# Patient Record
Sex: Female | Born: 1945 | Race: White | Hispanic: No | Marital: Married | Smoking: Former smoker
Health system: Southern US, Community
[De-identification: ages and names within clinical notes are randomized; demographics above are authoritative.]

## PROBLEM LIST (undated history)

## (undated) DIAGNOSIS — C4490 Unspecified malignant neoplasm of skin, unspecified: Secondary | ICD-10-CM

## (undated) DIAGNOSIS — T8859XA Other complications of anesthesia, initial encounter: Secondary | ICD-10-CM

## (undated) DIAGNOSIS — M069 Rheumatoid arthritis, unspecified: Secondary | ICD-10-CM

## (undated) DIAGNOSIS — I219 Acute myocardial infarction, unspecified: Secondary | ICD-10-CM

## (undated) DIAGNOSIS — D649 Anemia, unspecified: Secondary | ICD-10-CM

## (undated) DIAGNOSIS — M549 Dorsalgia, unspecified: Secondary | ICD-10-CM

## (undated) DIAGNOSIS — I2699 Other pulmonary embolism without acute cor pulmonale: Secondary | ICD-10-CM

## (undated) DIAGNOSIS — J189 Pneumonia, unspecified organism: Secondary | ICD-10-CM

## (undated) DIAGNOSIS — M419 Scoliosis, unspecified: Secondary | ICD-10-CM

## (undated) DIAGNOSIS — G8929 Other chronic pain: Secondary | ICD-10-CM

## (undated) DIAGNOSIS — K219 Gastro-esophageal reflux disease without esophagitis: Secondary | ICD-10-CM

## (undated) HISTORY — PX: TUBAL LIGATION: SHX77

## (undated) HISTORY — DX: Anemia, unspecified: D64.9

## (undated) HISTORY — PX: COLONOSCOPY W/ POLYPECTOMY: SHX1380

## (undated) HISTORY — PX: COLONOSCOPY: SHX174

## (undated) HISTORY — PX: RECTOCELE REPAIR: SHX761

## (undated) HISTORY — DX: Rheumatoid arthritis, unspecified: M06.9

## (undated) HISTORY — DX: Unspecified malignant neoplasm of skin, unspecified: C44.90

## (undated) HISTORY — DX: Gastro-esophageal reflux disease without esophagitis: K21.9

## (undated) HISTORY — DX: Scoliosis, unspecified: M41.9

## (undated) HISTORY — PX: TONSILLECTOMY: SUR1361

## (undated) HISTORY — PX: HERNIA REPAIR: SHX51

## (undated) HISTORY — DX: Other chronic pain: G89.29

## (undated) HISTORY — DX: Dorsalgia, unspecified: M54.9

## (undated) HISTORY — DX: Other pulmonary embolism without acute cor pulmonale: I26.99

## (undated) HISTORY — DX: Acute myocardial infarction, unspecified: I21.9

## (undated) HISTORY — PX: BUNIONECTOMY: SHX129

## (undated) HISTORY — PX: OTHER SURGICAL HISTORY: SHX169

## (undated) MED FILL — Iron Sucrose Inj 20 MG/ML (Fe Equiv): INTRAVENOUS | Qty: 10 | Status: AC

---

## 1998-04-28 ENCOUNTER — Ambulatory Visit (HOSPITAL_COMMUNITY): Admission: RE | Admit: 1998-04-28 | Discharge: 1998-04-28 | Payer: Self-pay | Admitting: Obstetrics and Gynecology

## 1998-10-04 ENCOUNTER — Ambulatory Visit (HOSPITAL_COMMUNITY): Admission: RE | Admit: 1998-10-04 | Discharge: 1998-10-04 | Payer: Self-pay | Admitting: Gastroenterology

## 2000-01-05 ENCOUNTER — Encounter: Payer: Self-pay | Admitting: Gynecology

## 2000-01-05 ENCOUNTER — Ambulatory Visit (HOSPITAL_COMMUNITY): Admission: RE | Admit: 2000-01-05 | Discharge: 2000-01-05 | Payer: Self-pay | Admitting: Gynecology

## 2001-06-14 ENCOUNTER — Ambulatory Visit (HOSPITAL_COMMUNITY): Admission: RE | Admit: 2001-06-14 | Discharge: 2001-06-14 | Payer: Self-pay | Admitting: Gynecology

## 2001-06-14 ENCOUNTER — Encounter: Payer: Self-pay | Admitting: Gynecology

## 2003-03-18 ENCOUNTER — Ambulatory Visit (HOSPITAL_COMMUNITY): Admission: RE | Admit: 2003-03-18 | Discharge: 2003-03-18 | Payer: Self-pay | Admitting: Gastroenterology

## 2003-05-18 ENCOUNTER — Emergency Department (HOSPITAL_COMMUNITY): Admission: EM | Admit: 2003-05-18 | Discharge: 2003-05-18 | Payer: Self-pay | Admitting: Emergency Medicine

## 2004-03-31 ENCOUNTER — Other Ambulatory Visit: Admission: RE | Admit: 2004-03-31 | Discharge: 2004-03-31 | Payer: Self-pay | Admitting: Gynecology

## 2004-04-07 ENCOUNTER — Ambulatory Visit (HOSPITAL_COMMUNITY): Admission: RE | Admit: 2004-04-07 | Discharge: 2004-04-07 | Payer: Self-pay | Admitting: Gynecology

## 2004-05-02 DIAGNOSIS — Z86711 Personal history of pulmonary embolism: Secondary | ICD-10-CM | POA: Insufficient documentation

## 2004-05-03 ENCOUNTER — Inpatient Hospital Stay (HOSPITAL_COMMUNITY): Admission: EM | Admit: 2004-05-03 | Discharge: 2004-05-04 | Payer: Self-pay | Admitting: Podiatry

## 2004-12-29 ENCOUNTER — Encounter: Admission: RE | Admit: 2004-12-29 | Discharge: 2004-12-29 | Payer: Self-pay

## 2006-03-19 ENCOUNTER — Other Ambulatory Visit: Admission: RE | Admit: 2006-03-19 | Discharge: 2006-03-19 | Payer: Self-pay | Admitting: Gynecology

## 2006-03-20 ENCOUNTER — Ambulatory Visit (HOSPITAL_COMMUNITY): Admission: RE | Admit: 2006-03-20 | Discharge: 2006-03-20 | Payer: Self-pay | Admitting: Gynecology

## 2006-08-27 ENCOUNTER — Emergency Department (HOSPITAL_COMMUNITY): Admission: EM | Admit: 2006-08-27 | Discharge: 2006-08-27 | Payer: Self-pay | Admitting: Emergency Medicine

## 2009-05-16 ENCOUNTER — Ambulatory Visit: Payer: Self-pay | Admitting: Cardiology

## 2009-05-16 ENCOUNTER — Inpatient Hospital Stay (HOSPITAL_COMMUNITY): Admission: EM | Admit: 2009-05-16 | Discharge: 2009-05-17 | Payer: Self-pay | Admitting: Emergency Medicine

## 2009-05-17 ENCOUNTER — Encounter: Payer: Self-pay | Admitting: Cardiology

## 2009-05-19 ENCOUNTER — Encounter: Payer: Self-pay | Admitting: Cardiology

## 2009-05-21 ENCOUNTER — Ambulatory Visit: Payer: Self-pay | Admitting: Cardiology

## 2009-05-21 ENCOUNTER — Ambulatory Visit (HOSPITAL_COMMUNITY): Admission: RE | Admit: 2009-05-21 | Discharge: 2009-05-21 | Payer: Self-pay | Admitting: Cardiology

## 2009-05-21 ENCOUNTER — Encounter: Payer: Self-pay | Admitting: Cardiology

## 2009-05-21 ENCOUNTER — Ambulatory Visit: Payer: Self-pay

## 2009-06-01 DIAGNOSIS — M549 Dorsalgia, unspecified: Secondary | ICD-10-CM | POA: Insufficient documentation

## 2009-06-01 DIAGNOSIS — Z8639 Personal history of other endocrine, nutritional and metabolic disease: Secondary | ICD-10-CM | POA: Insufficient documentation

## 2009-06-01 DIAGNOSIS — Z862 Personal history of diseases of the blood and blood-forming organs and certain disorders involving the immune mechanism: Secondary | ICD-10-CM | POA: Insufficient documentation

## 2009-06-01 DIAGNOSIS — M4120 Other idiopathic scoliosis, site unspecified: Secondary | ICD-10-CM | POA: Insufficient documentation

## 2009-06-02 ENCOUNTER — Ambulatory Visit: Payer: Self-pay | Admitting: Cardiology

## 2009-06-02 DIAGNOSIS — I214 Non-ST elevation (NSTEMI) myocardial infarction: Secondary | ICD-10-CM | POA: Insufficient documentation

## 2009-06-02 DIAGNOSIS — I252 Old myocardial infarction: Secondary | ICD-10-CM | POA: Insufficient documentation

## 2009-06-25 ENCOUNTER — Telehealth: Payer: Self-pay | Admitting: Cardiology

## 2009-07-07 ENCOUNTER — Encounter: Admission: RE | Admit: 2009-07-07 | Discharge: 2009-07-07 | Payer: Self-pay | Admitting: Internal Medicine

## 2009-07-07 ENCOUNTER — Ambulatory Visit (HOSPITAL_COMMUNITY): Admission: RE | Admit: 2009-07-07 | Discharge: 2009-07-07 | Payer: Self-pay | Admitting: Internal Medicine

## 2009-08-30 ENCOUNTER — Telehealth: Payer: Self-pay | Admitting: Cardiology

## 2010-07-21 NOTE — Progress Notes (Signed)
Summary: plavix  Phone Note Call from Patient Call back at [phone]   Caller: Daughter/Michelle Reason for Call: Talk to Nurse Summary of Call: plavix is to expensive and request something else be called in  Initial call taken by: Migdalia Dk,  June 25, 2009 11:56 AM  Follow-up for Phone Call        DAUGHTER AWARE NO GEN FOR PLAVIX. WILL LET MOTHER KNOW AND WILL CALL BACK IF HAS ANY FURTHER QUESTIONS Follow-up by: Scherrie Bateman, LPN,  June 25, 2009 12:28 PM

## 2010-07-21 NOTE — Letter (Signed)
Summary: [employer] Insurance Authorization  [facility] Insurance Authorization   Imported By: Roderic Ovens 07/19/2009 16:12:57  _____________________________________________________________________  External Attachment:    Type:   Image     Comment:   External Document

## 2010-07-21 NOTE — Progress Notes (Signed)
Summary: pt cant afford plavix  Phone Note Call from Patient Call back at [phone]   Caller: Daughter / Marcelino Duster Reason for Call: Talk to Nurse, Talk to Doctor Summary of Call: Per daughter Patient's insurance will not pay for Plavix anymore is there something else she can take cause pt cant afford it Initial call taken by: Omer Jack,  August 30, 2009 4:42 PM  Follow-up for Phone Call        [facility] Scherrie Bateman, LPN  August 30, 2009 5:04 PM WAS ABLE TO SPEAK WITH DAUGHTER RE PLAVIX INFORMED IT WOULD BE BEST IF MOM COULD STAY ON PLAVIX FOR 1 YEAR ACCORDING TO GUIDELINES  IS AT INCREASE FOR ANOTHER MI IF ONLY ON ASA.DAUGHTER WILL LET PT KNOW AND HAVE PT CALL  OFFICE IF ABLE TO CONTINUE. Follow-up by: Scherrie Bateman, LPN,  August 31, 2009 1:37 PM

## 2010-09-21 LAB — CBC
Hemoglobin: 13.7 g/dL (ref 12.0–15.0)
Hemoglobin: 13.9 g/dL (ref 12.0–15.0)
MCHC: 33.6 g/dL (ref 30.0–36.0)
MCHC: 33.7 g/dL (ref 30.0–36.0)
Platelets: 186 10*3/uL (ref 150–400)
Platelets: 212 10*3/uL (ref 150–400)
Platelets: 213 10*3/uL (ref 150–400)
RDW: 14.1 % (ref 11.5–15.5)
RDW: 14.2 % (ref 11.5–15.5)
RDW: 14.7 % (ref 11.5–15.5)
WBC: 6.5 10*3/uL (ref 4.0–10.5)

## 2010-09-21 LAB — COMPREHENSIVE METABOLIC PANEL
AST: 42 U/L — ABNORMAL HIGH (ref 0–37)
Albumin: 3.8 g/dL (ref 3.5–5.2)
BUN: 14 mg/dL (ref 6–23)
CO2: 28 mEq/L (ref 19–32)
Calcium: 9.4 mg/dL (ref 8.4–10.5)
Creatinine, Ser: 0.96 mg/dL (ref 0.4–1.2)
GFR calc Af Amer: >60 mL/min (ref >60)
GFR calc non Af Amer: 59 mL/min — ABNORMAL LOW (ref >60)

## 2010-09-21 LAB — POCT CARDIAC MARKERS
CKMB, poc: 3.1 ng/mL (ref 1.0–8.0)
Myoglobin, poc: 69.6 ng/mL (ref 12–200)
Troponin i, poc: 0.25 ng/mL (ref 0.00–0.09)

## 2010-09-21 LAB — CARDIAC PANEL(CRET KIN+CKTOT+MB+TROPI)
CK, MB: 5.5 ng/mL — ABNORMAL HIGH (ref 0.3–4.0)
Relative Index: INVALID (ref 0.0–2.5)
Total CK: 58 U/L (ref 7–177)
Total CK: 78 U/L (ref 7–177)
Troponin I: 0.54 ng/mL (ref 0.00–0.06)

## 2010-09-21 LAB — CK TOTAL AND CKMB (NOT AT ARMC)
CK, MB: 1.9 ng/mL (ref 0.3–4.0)
CK, MB: 6.8 ng/mL — ABNORMAL HIGH (ref 0.3–4.0)
Relative Index: INVALID (ref 0.0–2.5)

## 2010-09-21 LAB — LIPID PANEL
Cholesterol: 151 mg/dL (ref 0–200)
HDL: 58 mg/dL (ref >39)
LDL Cholesterol: 78 mg/dL (ref 0–99)
Total CHOL/HDL Ratio: 2.6 RATIO
Triglycerides: 76 mg/dL (ref <150)
VLDL: 15 mg/dL (ref 0–40)

## 2010-09-21 LAB — DIFFERENTIAL
Basophils Absolute: 0 10*3/uL (ref 0.0–0.1)
Eosinophils Relative: 4 % (ref 0–5)
Lymphocytes Relative: 18 % (ref 12–46)
Lymphs Abs: 1.2 10*3/uL (ref 0.7–4.0)
Neutro Abs: 4.5 10*3/uL (ref 1.7–7.7)

## 2010-09-21 LAB — TSH: TSH: 0.822 u[IU]/mL (ref 0.350–4.500)

## 2010-09-21 LAB — TROPONIN I
Troponin I: 0.04 ng/mL (ref 0.00–0.06)
Troponin I: 0.68 ng/mL (ref 0.00–0.06)

## 2010-09-21 LAB — HEPARIN LEVEL (UNFRACTIONATED): Heparin Unfractionated: 0.78 IU/mL — ABNORMAL HIGH (ref 0.30–0.70)

## 2010-09-21 LAB — MAGNESIUM: Magnesium: 2.2 mg/dL (ref 1.5–2.5)

## 2010-09-21 LAB — APTT: aPTT: 25 seconds (ref 24–37)

## 2010-11-04 NOTE — Discharge Summary (Signed)
Janice [name], Janice [name]             ACCOUNT NO.:  [id number]   MEDICAL RECORD NO.:  [id number]          PATIENT TYPE:  INP   LOCATION:  2001                         FACILITY:  [facility]   PHYSICIAN:  Lonia Blood, M.D.      DATE OF BIRTH:  [birth year]   DATE OF ADMISSION:  05/03/2004  DATE OF DISCHARGE:  05/04/2004                                 DISCHARGE SUMMARY   DISCHARGE DIAGNOSES:  1.  Bilateral pulmonary emboli.  2.  Left lower lobe nodule.  3.  Rheumatoid arthritis.   DISCHARGE MEDICATIONS:  1.  Lovenox by subcutaneous injection 100 mg twice a day.  Lovenox to be      given by her daughter __________.  2.  Coumadin 7.5 mg daily.  3.  Patient to resume home medications which include:      1.  Nexium 40 mg daily.      2.  Kay Ciel 20 mEq daily.      3.  Methotrexate 2.5 mg 10 tablets weekly on Friday.      4.  Celebrex 200 mg b.i.d.      5.  Prednisone 5 mg daily.   PRIMARY CARE PHYSICIAN:  Dr. Areatha Keas with [facility].   DISPOSITION:  The patient is being discharged home with some home health.  Home health nurse will come out to the patient's house to check her PT/INR  every day for the first 3 days and then subsequently twice a week.  She will  be followed by her primary care physician, who is Dr. Phylliss Bob, in his office to  check her INR as necessary until she is therapeutic.  Between now and when  her INR is going to be therapeutic, the patient's daughter will continue to  administer Lovenox 100 mg twice a day to the patient subcutaneously at home.  The patient's daughter was trained in the hospital and was observed to give  the patient Lovenox successfully.   CONSULTATIONS:  None.   PROCEDURES PERFORMED:  CT of chest with contrast performed on May 12, 2004 that showed right lower lobe emboli and probable left lower lobe emboli  as well, 8 x 6-mm left lower lobe nodule, left lower lobe bronchiectasis or  scarring, ground-glass airspace opacity in  the posterior left upper lobe  with questionable inflammatory or circular pulmonary embolus, but followup  recommended, shortened mediastinal and bilateral hilar lymph nodes which may  be reactive.   BRIEF HISTORY AND PHYSICAL:  Janice [name] is a 65 year old lady with history  of rheumatoid arthritis and GERD, among other things.  She was admitted on  the 15th with generalized weakness and chest tightness.  The patient was  evaluated in the emergency room and was found to have the bilateral  embolisms as described above.  She was subsequently admitted for  anticoagulation and further treatment.   HOSPITAL COURSE:  PROBLEM #1 - BILATERAL PULMONARY EMBOLI:  The patient was  initially started on IV heparin.  Subsequently, that was switched to Lovenox  and Coumadin.  The patient's daughter as well as patient were both educated  and given more tapes on use of anticoagulation.  They were given options of  continued anticoagulation in the hospital with Lovenox or heparin until her  INR was therapeutic with a goal between 2 and 3; the other option is to  continue to use Lovenox at home and check the INR.  The patient opted for  going home with Lovenox injection at home.  We subsequently discharged the  patient after training her and her daughter on how to do this.  She will be  followed up by her primary care physician and we will give a call to Dr.  Phylliss Bob so he knows what is happening.   PROBLEM #2 - LEFT LOWER LOBE NODULE:  The patient has a left lower lobe  nodule which is small per her CT.  We reported this to her primary care  physician so he could have a followup with the patient probably 6 months  from now to see if that is stable.   PROBLEM #3 - RHEUMATOID ARTHRITIS:  This seems to be stable.  The patient is  already on methotrexate and prednisone, and she is to continue on her home  medicine.   PROBLEM #4 - GASTROESOPHAGEAL REFLUX DISEASE:  She has been taking Nexium at  home for  her GERD and over here we also continued her on PPIs, and she is  going back home on the same medicine.   OTHER TESTS:  Other tests ordered were lower extremity Dopplers performed on  May 03, 2004 that showed no apparent DVT seen in the lower extremities.       LG/MEDQ  D:  05/04/2004  T:  05/05/2004  Job:  [id number]   cc:   Areatha Keas, M.D.  [address]  [address]  [place]  [place] [address]  Fax: [phone]

## 2010-11-04 NOTE — Op Note (Signed)
NAMEMAGHEN, GROUP                       ACCOUNT NO.:  [id number]   MEDICAL RECORD NO.:  [id number]                   PATIENT TYPE:  AMB   LOCATION:  ENDO                                 FACILITY:  [facility]   PHYSICIAN:  Petra Kuba, M.D.                 DATE OF BIRTH:  [birth year]   DATE OF PROCEDURE:  DATE OF DISCHARGE:  03/18/2003                                 OPERATIVE REPORT   PROCEDURE:  Colonoscopy.   INDICATIONS FOR PROCEDURE:  A patient with a history of colon polyps due for  repeat screening. Consent was signed after risks, benefits, methods, and  options were thoroughly discussed multiple times in the past.   MEDICINES USED:  Demerol 50, Versed 7.   DESCRIPTION OF PROCEDURE:  Rectal inspection was pertinent for external  hemorrhoids, small. Digital exam was negative. The pediatric video  adjustable colonoscope was inserted and fairly easily advanced around the  colon to the cecum. This did require some abdominal pressure but no position  changes. The scope was inserted a short ways into the terminal ileum which  was normal. Photo documentation was obtained. The scope was slowly  withdrawn. In the cecum and on the right side of the colon was a pool of red  fluid. It looked more like red jello or juice but we went ahead and  suctioned some it just to confirm it and it was guaiac negative. The scope  was further withdrawn. There was left greater than right diverticula with  only a few scattered on the right side of the colon but other than that no  other abnormalities were seen as we slowly withdrew back to the rectum. The  prep was fairly adequate and did require some washing and suctioning for  adequate visualization. No polypoid lesions were seen. Anal rectal pull  through and retroflexion confirmed some small hemorrhoids. The scope was  reinserted a short ways up the left side of the colon, air was suctioned,  scope removed. The patient tolerated the procedure  well. There was no  obvious or immediate complication.   ENDOSCOPIC DIAGNOSIS:  1. Internal and external hemorrhoids.  2. Left greater than right diverticula.  3. Otherwise within normal limits to the terminal ileum as in his side some     red liquid was suctioned and guaiac negative from the right side of the     colon.    PLAN:  Happy to see back p.r.n.  Return care to Dr. Arlyce Dice and Phylliss Bob for the  customary health care maintenance to include yearly rectals and guaiacs and  repeat colon screening in five years.                                               Petra Kuba, M.D.  MEM/MEDQ  D:  03/18/2003  T:  03/18/2003  Job:  [id number]   cc:   Teena Irani. Arlyce Dice, M.D.  [address]  [place]  [place] [address]  Fax: [phone]   Areatha Keas, M.D.  [address]  [address]  [place]  [place] [address]  Fax: [phone]

## 2010-11-04 NOTE — H&P (Signed)
Janice [name], Janice [name] NO.:  [id number]   MEDICAL RECORD NO.:  [id number]          PATIENT TYPE:  INP   LOCATION:  1828                         FACILITY:  [facility]   PHYSICIAN:  Michaelyn Barter, M.D. DATE OF BIRTH:  Mar 26, 1946   DATE OF ADMISSION:  05/02/2004  DATE OF DISCHARGE:                                HISTORY & PHYSICAL   CHIEF COMPLAINT:  Chest tightness.   HISTORY OF PRESENT ILLNESS:  The patient states that she broke her right  ankle several months ago. Said that she was incapacitated for approximately  4 months secondary to this ankle fracture. She initially had a cast placed  over the fracture. The cast was subsequently replaced by a boot. Said that  the boot was removed last week. However, she noticed that both of her ankles  had began to swell. She said that the right ankle swelling was greater than  the left. She also noticed that the swelling traveled to her right knee. She  went on to state that approximately a week and a half ago, she began to  develop chest tightness, which comes and goes. She went to see her primary  care physician, Dr. Phylliss Bob today for evaluation of the bilateral ankle  swelling. However, while in his office, she had an EKG performed, which  revealed some abnormalities and also had a pulse oximetry performed, which  showed her oxygen saturation to be low. Dr. Phylliss Bob became concerned about the  possibility of a pulmonary embolism and referred her to the emergency room  for further evaluation. Currently, she states that she is having some mild  chest tightness, described as approximately a 3 out of 10 but she denies  having any current shortness of breath.   PAST MEDICAL HISTORY:  1.  Right ankle fracture.  2.  Rheumatoid arthritis.   PAST SURGICAL HISTORY:  1.  Hernia removal.  2.  Tonsillectomy.  3.  Bladder surgery.  4.  Bunionectomy.   ALLERGIES:  No known drug allergies.   FAMILY HISTORY:  Mother deceased from  esophageal cancer. Father, fluid in  the lungs.   ADMISSION MEDICATIONS:  1.  Methotrexate.  2.  Celebrex.  3.  Nexium.  4.  Furosemide.  5.  Potassium.  The patient does not know the dosages of any of her current medications but  states that her daughter will bring those dosages with her tomorrow.   REVIEW OF SYSTEMS:  As per HPI. Otherwise, all other systems are negative.   PHYSICAL EXAMINATION:  GENERAL:  No obvious distress.  HEENT:  Anicteric. Extraocular muscles intact.  NECK:  Supple. No lymphadenopathy. Thyroid is not palpable.  CARDIAC:  S1 and S2 is present. Regular rate and rhythm.  LUNGS:  Clear bilaterally. No crackles. No wheezes.  ABDOMEN:  Soft, nontender, nondistended. Positive bowel sounds.  EXTREMITIES:  Right leg/ankle is slightly larger than the left. There is no  calf tenderness bilaterally to palpation.  MUSCULOSKELETAL:  5 out of 5 upper and lower extremity strength.  NEUROLOGIC:  The patient is alert and oriented x3.   LABORATORY DATA:  Chest CT  scan completed on May 02, 2004 interpreted  by the radiologist as right lower lobe pulmonary emboli with probable left  lower lobe pulmonary emboli. There is an 8 x 6 mm left lower lobe nodule for  which followup is recommended. Ground glass opacities within the left upper  lobe, probably inflammatory or related to pulmonary embolism, but recommend  followup to resolution. Left lower lobe bronchiectasis.   ASSESSMENT/PLAN:  1.  Bilateral pulmonary embolism. Will continue intravenous heparin, which      was initiated in the emergency room. Will get venous duplex of both      lower extremities to rule out  the possibility of deep vein thrombosis.      Will provide Darvocet p.r.n. for pain. Will continue oxygen via nasal      cannula.  2.  Rheumatoid arthritis. Currently stable. The patient's daughter will      bring dosages of home medications tomorrow.  3.  Gastrointestinal prophylaxis Protonix 40 mg q.d.   3.      Orla   OR/MEDQ  D:  05/03/2004  T:  05/03/2004  Job:  [id number]   cc:   Areatha Keas, M.D.  [address]  [address]  [place]  [place] [address]  Fax: [phone]

## 2010-11-23 IMAGING — US US ABDOMEN COMPLETE
1 series · 14 of 25 positions shown · non-contrast
Comparison: None.

CLINICAL DATA: Elevated LFTs.

ABDOMINAL ULTRASOUND COMPLETE

[Series 1: us abdomen complete · 0.33mm/px · 14 of 66 slices shown]
[im 1/66]
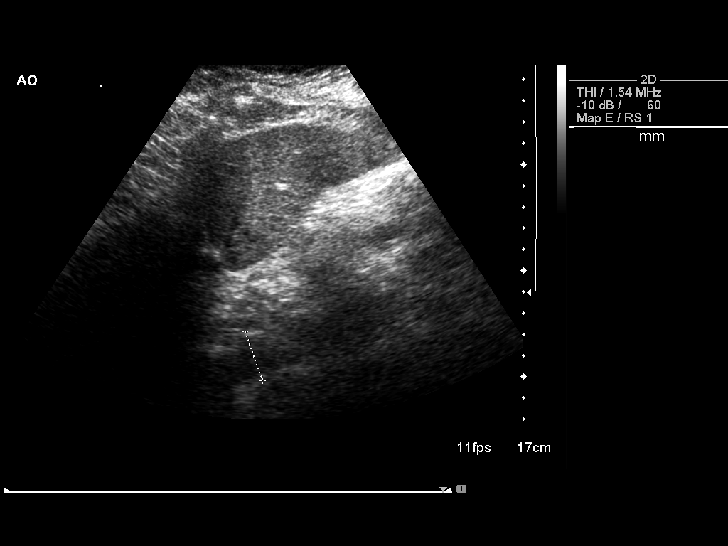
[im 6/66]
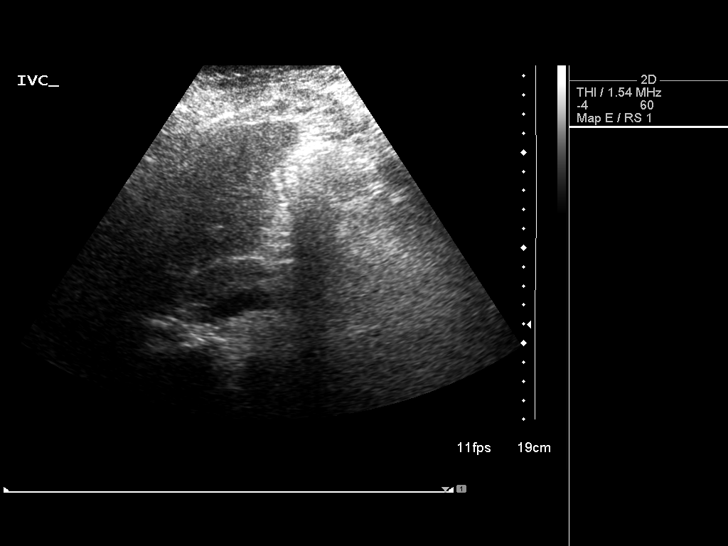
[im 11/66]
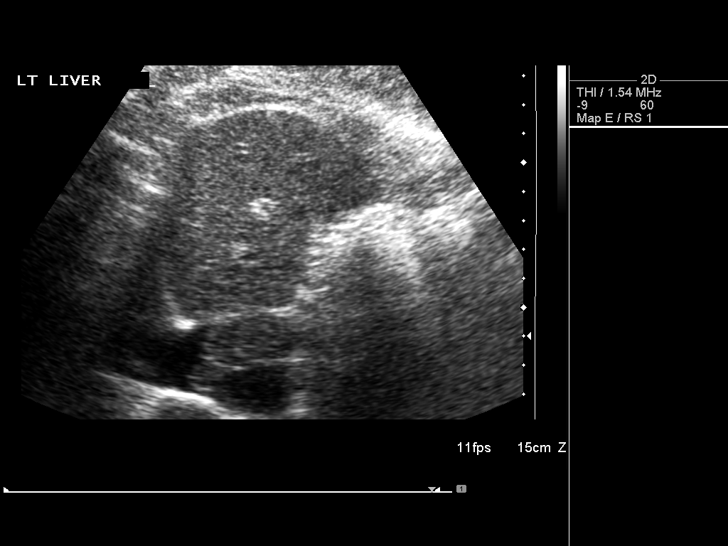
[im 17/66]
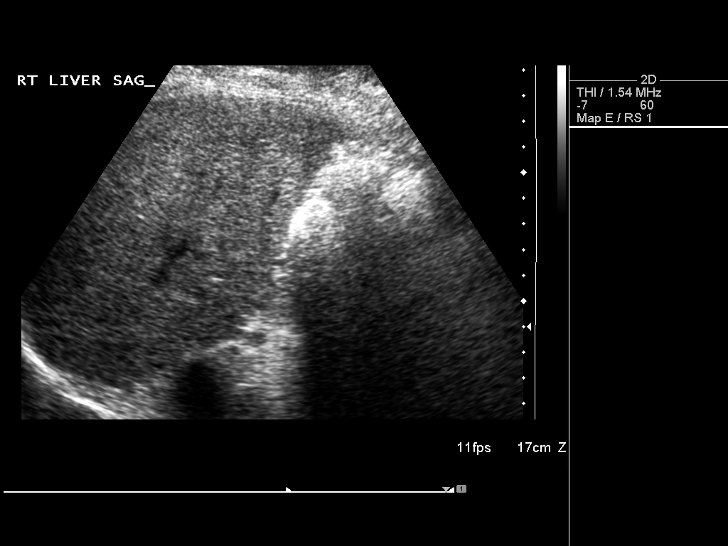
[im 22/66]
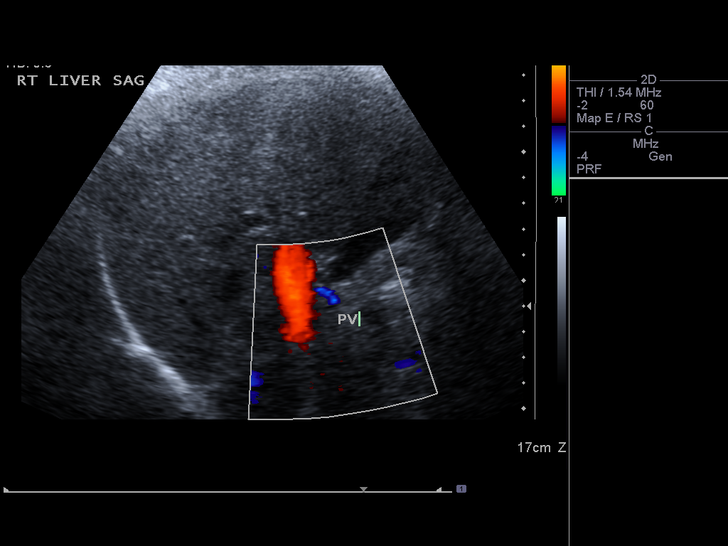
[im 25/66]
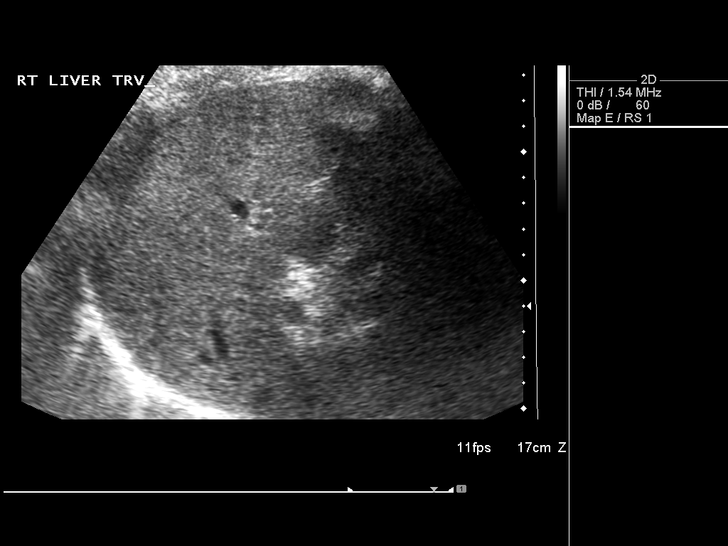
[im 30/66]
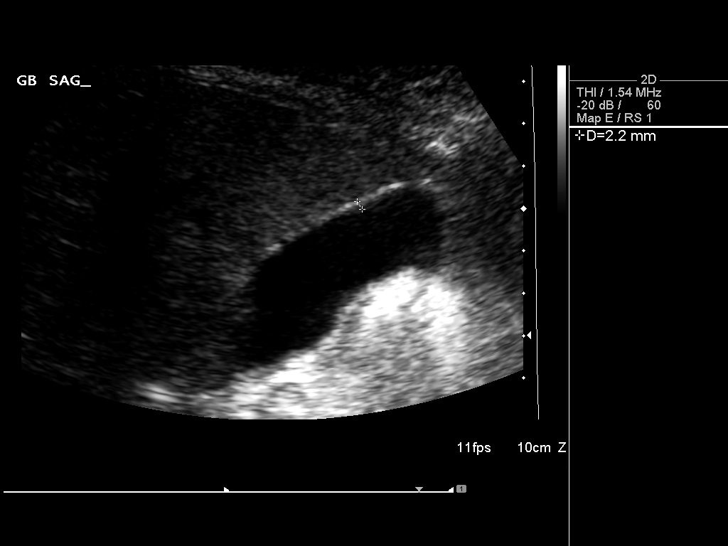
[im 36/66]
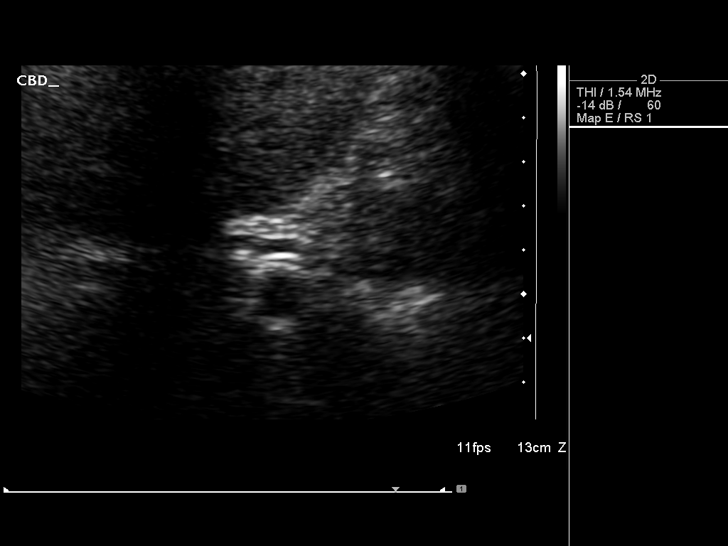
[im 41/66]
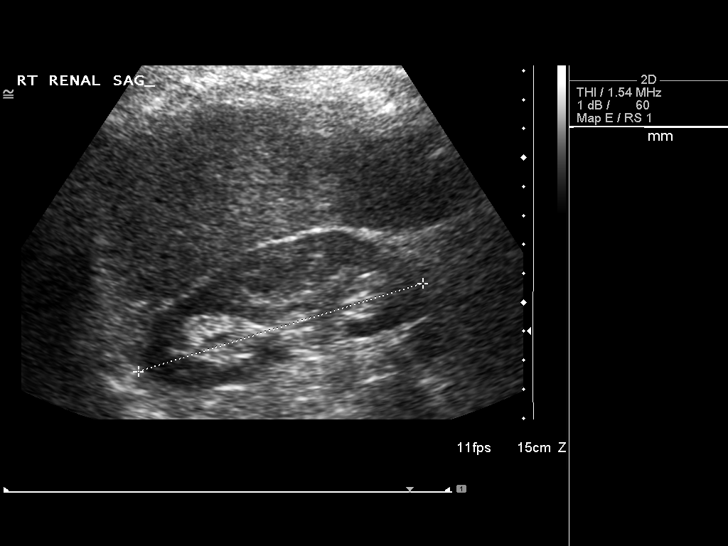
[im 44/66]
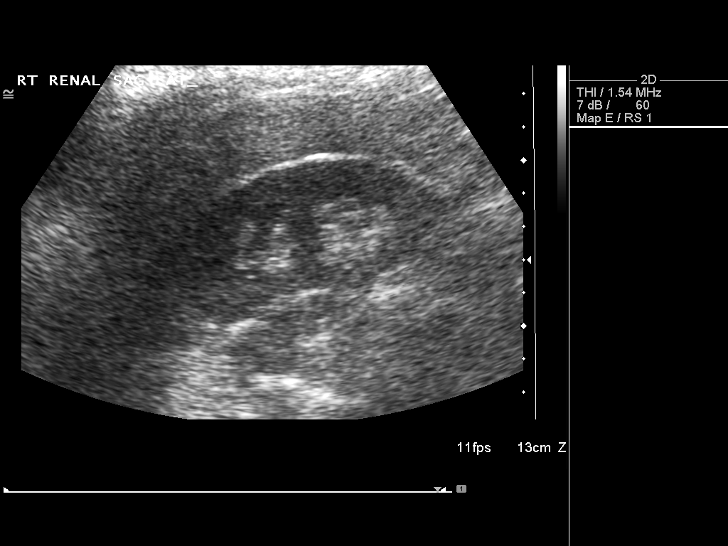
[im 49/66]
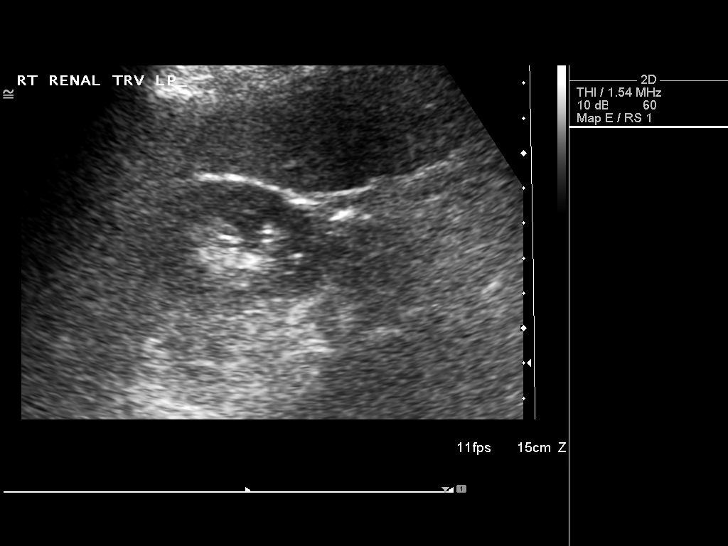
[im 55/66]
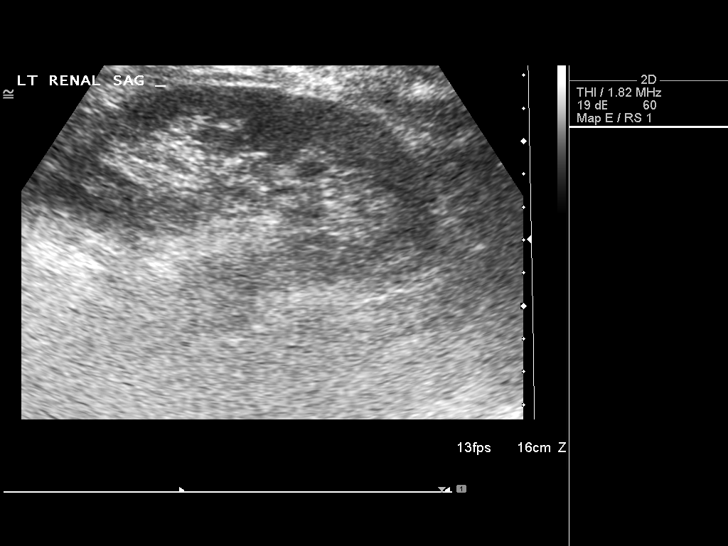
[im 60/66]
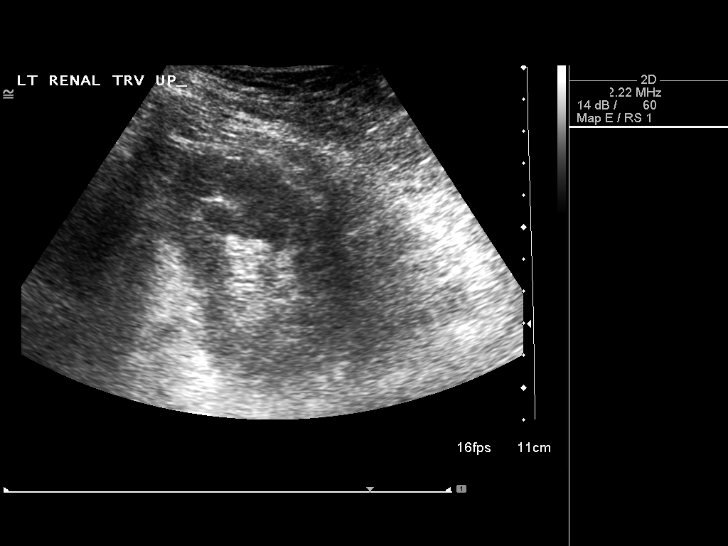
[im 66/66]
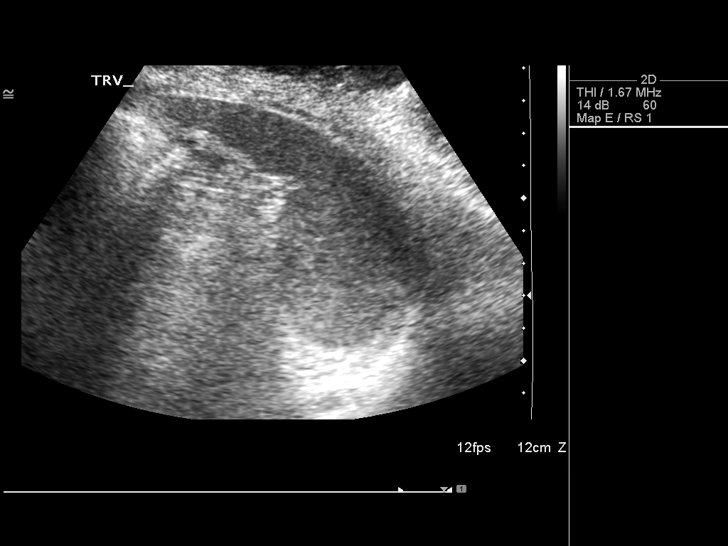

[14 of 25 positions shown; findings below may reference images not displayed]

FINDINGS: Gallbladder:  No gallstones, gallbladder wall thickening, or
pericholecystic fluid.

Common Bile Duct:  Within normal limits in caliber.  3.5 mm
diameter of the CBD.

Liver:  No focal lesion identified.  Findings consistent with fatty
infiltration of the liver.

IVC:  Appears normal.

Pancreas:  Although the pancreas is difficult to visualize in its
entirety, no focal pancreatic abnormality is identified.

Spleen:  Within normal limits in size and echotexture.  Splenic
length is 6.0 cm.

Right kidney:  Normal in size and parenchymal echogenicity.  No
evidence of mass or hydronephrosis.   10.4 cm length.

Left kidney:  Normal in size and parenchymal echogenicity.  No
hydronephrosis. 1.6 x 1.7 x 1.8 cm cyst in the lower pole.  11.3 cm
length.

Abdominal Aorta:  No aneurysm identified.  2.5 cm maximum diameter
IMPRESSION: Negative gallbladder.  No biliary duct dilatation.  Fatty
infiltration of the liver.  Left renal cyst.

## 2010-12-20 ENCOUNTER — Encounter: Payer: Self-pay | Admitting: Cardiology

## 2011-05-15 ENCOUNTER — Other Ambulatory Visit (HOSPITAL_COMMUNITY)
Admission: RE | Admit: 2011-05-15 | Discharge: 2011-05-15 | Disposition: A | Discharge status: Home / Self Care | Payer: BC Managed Care – PPO | Source: Ambulatory Visit | Attending: Internal Medicine | Admitting: Internal Medicine

## 2011-05-15 DIAGNOSIS — Z01419 Encounter for gynecological examination (general) (routine) without abnormal findings: Secondary | ICD-10-CM | POA: Insufficient documentation

## 2011-06-26 ENCOUNTER — Ambulatory Visit: Payer: Self-pay | Admitting: Cardiology

## 2012-06-25 ENCOUNTER — Ambulatory Visit: Payer: BC Managed Care – PPO | Admitting: Cardiology

## 2015-06-23 DIAGNOSIS — M8580 Other specified disorders of bone density and structure, unspecified site: Secondary | ICD-10-CM | POA: Diagnosis not present

## 2015-06-23 DIAGNOSIS — M150 Primary generalized (osteo)arthritis: Secondary | ICD-10-CM | POA: Diagnosis not present

## 2015-06-23 DIAGNOSIS — M170 Bilateral primary osteoarthritis of knee: Secondary | ICD-10-CM | POA: Diagnosis not present

## 2015-06-23 DIAGNOSIS — M0589 Other rheumatoid arthritis with rheumatoid factor of multiple sites: Secondary | ICD-10-CM | POA: Diagnosis not present

## 2015-06-23 DIAGNOSIS — M5136 Other intervertebral disc degeneration, lumbar region: Secondary | ICD-10-CM | POA: Diagnosis not present

## 2015-10-06 DIAGNOSIS — R69 Illness, unspecified: Secondary | ICD-10-CM | POA: Diagnosis not present

## 2016-01-21 DIAGNOSIS — M150 Primary generalized (osteo)arthritis: Secondary | ICD-10-CM | POA: Diagnosis not present

## 2016-01-21 DIAGNOSIS — M8580 Other specified disorders of bone density and structure, unspecified site: Secondary | ICD-10-CM | POA: Diagnosis not present

## 2016-01-21 DIAGNOSIS — M5136 Other intervertebral disc degeneration, lumbar region: Secondary | ICD-10-CM | POA: Diagnosis not present

## 2016-01-21 DIAGNOSIS — M0589 Other rheumatoid arthritis with rheumatoid factor of multiple sites: Secondary | ICD-10-CM | POA: Diagnosis not present

## 2016-04-25 DIAGNOSIS — Z79899 Other long term (current) drug therapy: Secondary | ICD-10-CM | POA: Diagnosis not present

## 2016-04-25 DIAGNOSIS — M0589 Other rheumatoid arthritis with rheumatoid factor of multiple sites: Secondary | ICD-10-CM | POA: Diagnosis not present

## 2016-07-09 DIAGNOSIS — J181 Lobar pneumonia, unspecified organism: Secondary | ICD-10-CM | POA: Diagnosis not present

## 2016-07-09 DIAGNOSIS — R531 Weakness: Secondary | ICD-10-CM | POA: Diagnosis not present

## 2016-07-09 DIAGNOSIS — J09X2 Influenza due to identified novel influenza A virus with other respiratory manifestations: Secondary | ICD-10-CM | POA: Diagnosis not present

## 2016-07-09 DIAGNOSIS — R05 Cough: Secondary | ICD-10-CM | POA: Diagnosis not present

## 2016-07-09 DIAGNOSIS — J101 Influenza due to other identified influenza virus with other respiratory manifestations: Secondary | ICD-10-CM | POA: Diagnosis not present

## 2016-07-26 DIAGNOSIS — M0589 Other rheumatoid arthritis with rheumatoid factor of multiple sites: Secondary | ICD-10-CM | POA: Diagnosis not present

## 2016-07-26 DIAGNOSIS — M5136 Other intervertebral disc degeneration, lumbar region: Secondary | ICD-10-CM | POA: Diagnosis not present

## 2016-07-26 DIAGNOSIS — M150 Primary generalized (osteo)arthritis: Secondary | ICD-10-CM | POA: Diagnosis not present

## 2016-07-26 DIAGNOSIS — M8580 Other specified disorders of bone density and structure, unspecified site: Secondary | ICD-10-CM | POA: Diagnosis not present

## 2017-02-12 DIAGNOSIS — M0589 Other rheumatoid arthritis with rheumatoid factor of multiple sites: Secondary | ICD-10-CM | POA: Diagnosis not present

## 2017-02-12 DIAGNOSIS — M5136 Other intervertebral disc degeneration, lumbar region: Secondary | ICD-10-CM | POA: Diagnosis not present

## 2017-02-12 DIAGNOSIS — M545 Low back pain: Secondary | ICD-10-CM | POA: Diagnosis not present

## 2017-02-12 DIAGNOSIS — M150 Primary generalized (osteo)arthritis: Secondary | ICD-10-CM | POA: Diagnosis not present

## 2017-02-12 DIAGNOSIS — M8580 Other specified disorders of bone density and structure, unspecified site: Secondary | ICD-10-CM | POA: Diagnosis not present

## 2017-03-15 DIAGNOSIS — M545 Low back pain: Secondary | ICD-10-CM | POA: Diagnosis not present

## 2017-03-15 DIAGNOSIS — M5416 Radiculopathy, lumbar region: Secondary | ICD-10-CM | POA: Diagnosis not present

## 2017-03-15 DIAGNOSIS — M5136 Other intervertebral disc degeneration, lumbar region: Secondary | ICD-10-CM | POA: Diagnosis not present

## 2017-03-20 DIAGNOSIS — M545 Low back pain: Secondary | ICD-10-CM | POA: Diagnosis not present

## 2017-03-26 DIAGNOSIS — M5440 Lumbago with sciatica, unspecified side: Secondary | ICD-10-CM | POA: Diagnosis not present

## 2017-03-26 DIAGNOSIS — Z01812 Encounter for preprocedural laboratory examination: Secondary | ICD-10-CM | POA: Diagnosis not present

## 2017-03-26 DIAGNOSIS — M545 Low back pain: Secondary | ICD-10-CM | POA: Diagnosis not present

## 2017-03-27 DIAGNOSIS — M545 Low back pain: Secondary | ICD-10-CM | POA: Diagnosis not present

## 2017-03-29 DIAGNOSIS — M7061 Trochanteric bursitis, right hip: Secondary | ICD-10-CM | POA: Diagnosis not present

## 2017-03-29 DIAGNOSIS — M7062 Trochanteric bursitis, left hip: Secondary | ICD-10-CM | POA: Diagnosis not present

## 2017-03-29 DIAGNOSIS — M545 Low back pain: Secondary | ICD-10-CM | POA: Diagnosis not present

## 2017-03-29 DIAGNOSIS — M5136 Other intervertebral disc degeneration, lumbar region: Secondary | ICD-10-CM | POA: Diagnosis not present

## 2017-05-01 DIAGNOSIS — J209 Acute bronchitis, unspecified: Secondary | ICD-10-CM | POA: Diagnosis not present

## 2017-07-16 DIAGNOSIS — M8580 Other specified disorders of bone density and structure, unspecified site: Secondary | ICD-10-CM | POA: Diagnosis not present

## 2017-07-16 DIAGNOSIS — M150 Primary generalized (osteo)arthritis: Secondary | ICD-10-CM | POA: Diagnosis not present

## 2017-07-16 DIAGNOSIS — M545 Low back pain: Secondary | ICD-10-CM | POA: Diagnosis not present

## 2017-07-16 DIAGNOSIS — Z79899 Other long term (current) drug therapy: Secondary | ICD-10-CM | POA: Diagnosis not present

## 2017-07-16 DIAGNOSIS — M0589 Other rheumatoid arthritis with rheumatoid factor of multiple sites: Secondary | ICD-10-CM | POA: Diagnosis not present

## 2017-07-16 DIAGNOSIS — M5136 Other intervertebral disc degeneration, lumbar region: Secondary | ICD-10-CM | POA: Diagnosis not present

## 2017-11-14 DIAGNOSIS — C44311 Basal cell carcinoma of skin of nose: Secondary | ICD-10-CM | POA: Diagnosis not present

## 2017-11-14 DIAGNOSIS — D485 Neoplasm of uncertain behavior of skin: Secondary | ICD-10-CM | POA: Diagnosis not present

## 2017-12-18 DIAGNOSIS — M0589 Other rheumatoid arthritis with rheumatoid factor of multiple sites: Secondary | ICD-10-CM | POA: Diagnosis not present

## 2017-12-18 DIAGNOSIS — M8580 Other specified disorders of bone density and structure, unspecified site: Secondary | ICD-10-CM | POA: Diagnosis not present

## 2017-12-18 DIAGNOSIS — M5136 Other intervertebral disc degeneration, lumbar region: Secondary | ICD-10-CM | POA: Diagnosis not present

## 2017-12-18 DIAGNOSIS — M150 Primary generalized (osteo)arthritis: Secondary | ICD-10-CM | POA: Diagnosis not present

## 2017-12-18 DIAGNOSIS — M545 Low back pain: Secondary | ICD-10-CM | POA: Diagnosis not present

## 2017-12-18 DIAGNOSIS — Z79899 Other long term (current) drug therapy: Secondary | ICD-10-CM | POA: Diagnosis not present

## 2018-01-01 DIAGNOSIS — Z85828 Personal history of other malignant neoplasm of skin: Secondary | ICD-10-CM | POA: Diagnosis not present

## 2018-01-01 DIAGNOSIS — C44311 Basal cell carcinoma of skin of nose: Secondary | ICD-10-CM | POA: Diagnosis not present

## 2018-03-19 DIAGNOSIS — M545 Low back pain: Secondary | ICD-10-CM | POA: Diagnosis not present

## 2018-03-19 DIAGNOSIS — M5136 Other intervertebral disc degeneration, lumbar region: Secondary | ICD-10-CM | POA: Diagnosis not present

## 2018-03-19 DIAGNOSIS — M150 Primary generalized (osteo)arthritis: Secondary | ICD-10-CM | POA: Diagnosis not present

## 2018-03-19 DIAGNOSIS — M8580 Other specified disorders of bone density and structure, unspecified site: Secondary | ICD-10-CM | POA: Diagnosis not present

## 2018-03-19 DIAGNOSIS — Z79899 Other long term (current) drug therapy: Secondary | ICD-10-CM | POA: Diagnosis not present

## 2018-03-19 DIAGNOSIS — M810 Age-related osteoporosis without current pathological fracture: Secondary | ICD-10-CM | POA: Diagnosis not present

## 2018-03-19 DIAGNOSIS — M25559 Pain in unspecified hip: Secondary | ICD-10-CM | POA: Diagnosis not present

## 2018-03-19 DIAGNOSIS — M0589 Other rheumatoid arthritis with rheumatoid factor of multiple sites: Secondary | ICD-10-CM | POA: Diagnosis not present

## 2018-03-19 DIAGNOSIS — M160 Bilateral primary osteoarthritis of hip: Secondary | ICD-10-CM | POA: Diagnosis not present

## 2018-05-22 DIAGNOSIS — S8261XA Displaced fracture of lateral malleolus of right fibula, initial encounter for closed fracture: Secondary | ICD-10-CM | POA: Diagnosis not present

## 2018-05-22 DIAGNOSIS — M25571 Pain in right ankle and joints of right foot: Secondary | ICD-10-CM | POA: Diagnosis not present

## 2018-05-23 DIAGNOSIS — N390 Urinary tract infection, site not specified: Secondary | ICD-10-CM | POA: Diagnosis not present

## 2018-05-23 DIAGNOSIS — R309 Painful micturition, unspecified: Secondary | ICD-10-CM | POA: Diagnosis not present

## 2018-06-20 DIAGNOSIS — M25571 Pain in right ankle and joints of right foot: Secondary | ICD-10-CM | POA: Diagnosis not present

## 2018-06-20 DIAGNOSIS — M8580 Other specified disorders of bone density and structure, unspecified site: Secondary | ICD-10-CM | POA: Diagnosis not present

## 2018-06-20 DIAGNOSIS — M0589 Other rheumatoid arthritis with rheumatoid factor of multiple sites: Secondary | ICD-10-CM | POA: Diagnosis not present

## 2018-06-20 DIAGNOSIS — Z79899 Other long term (current) drug therapy: Secondary | ICD-10-CM | POA: Diagnosis not present

## 2018-06-20 DIAGNOSIS — M810 Age-related osteoporosis without current pathological fracture: Secondary | ICD-10-CM | POA: Diagnosis not present

## 2018-06-20 DIAGNOSIS — M150 Primary generalized (osteo)arthritis: Secondary | ICD-10-CM | POA: Diagnosis not present

## 2018-06-20 DIAGNOSIS — M545 Low back pain: Secondary | ICD-10-CM | POA: Diagnosis not present

## 2018-06-20 DIAGNOSIS — M5136 Other intervertebral disc degeneration, lumbar region: Secondary | ICD-10-CM | POA: Diagnosis not present

## 2018-06-21 DIAGNOSIS — M25571 Pain in right ankle and joints of right foot: Secondary | ICD-10-CM | POA: Diagnosis not present

## 2018-06-21 DIAGNOSIS — S8261XA Displaced fracture of lateral malleolus of right fibula, initial encounter for closed fracture: Secondary | ICD-10-CM | POA: Diagnosis not present

## 2018-07-09 DIAGNOSIS — R05 Cough: Secondary | ICD-10-CM | POA: Diagnosis not present

## 2018-07-09 DIAGNOSIS — N390 Urinary tract infection, site not specified: Secondary | ICD-10-CM | POA: Diagnosis not present

## 2018-07-09 DIAGNOSIS — R309 Painful micturition, unspecified: Secondary | ICD-10-CM | POA: Diagnosis not present

## 2018-07-09 DIAGNOSIS — Z08 Encounter for follow-up examination after completed treatment for malignant neoplasm: Secondary | ICD-10-CM | POA: Diagnosis not present

## 2018-07-22 DIAGNOSIS — M25571 Pain in right ankle and joints of right foot: Secondary | ICD-10-CM | POA: Diagnosis not present

## 2018-07-22 DIAGNOSIS — S8261XA Displaced fracture of lateral malleolus of right fibula, initial encounter for closed fracture: Secondary | ICD-10-CM | POA: Diagnosis not present

## 2018-09-09 DIAGNOSIS — S8261XK Displaced fracture of lateral malleolus of right fibula, subsequent encounter for closed fracture with nonunion: Secondary | ICD-10-CM | POA: Diagnosis not present

## 2018-09-09 DIAGNOSIS — M25571 Pain in right ankle and joints of right foot: Secondary | ICD-10-CM | POA: Diagnosis not present

## 2018-09-11 ENCOUNTER — Other Ambulatory Visit: Payer: Self-pay | Admitting: Orthopedic Surgery

## 2018-09-11 DIAGNOSIS — S8261XK Displaced fracture of lateral malleolus of right fibula, subsequent encounter for closed fracture with nonunion: Secondary | ICD-10-CM

## 2018-11-01 ENCOUNTER — Other Ambulatory Visit: Payer: Medicare HMO

## 2018-11-05 ENCOUNTER — Other Ambulatory Visit: Payer: Self-pay

## 2018-11-13 DIAGNOSIS — M810 Age-related osteoporosis without current pathological fracture: Secondary | ICD-10-CM | POA: Diagnosis not present

## 2018-11-13 DIAGNOSIS — M150 Primary generalized (osteo)arthritis: Secondary | ICD-10-CM | POA: Diagnosis not present

## 2018-11-13 DIAGNOSIS — M5136 Other intervertebral disc degeneration, lumbar region: Secondary | ICD-10-CM | POA: Diagnosis not present

## 2018-11-13 DIAGNOSIS — M545 Low back pain: Secondary | ICD-10-CM | POA: Diagnosis not present

## 2018-11-13 DIAGNOSIS — Z79899 Other long term (current) drug therapy: Secondary | ICD-10-CM | POA: Diagnosis not present

## 2018-11-13 DIAGNOSIS — M8580 Other specified disorders of bone density and structure, unspecified site: Secondary | ICD-10-CM | POA: Diagnosis not present

## 2018-11-13 DIAGNOSIS — M0589 Other rheumatoid arthritis with rheumatoid factor of multiple sites: Secondary | ICD-10-CM | POA: Diagnosis not present

## 2018-11-13 DIAGNOSIS — M25571 Pain in right ankle and joints of right foot: Secondary | ICD-10-CM | POA: Diagnosis not present

## 2018-11-13 DIAGNOSIS — M25559 Pain in unspecified hip: Secondary | ICD-10-CM | POA: Diagnosis not present

## 2018-11-20 DIAGNOSIS — M5136 Other intervertebral disc degeneration, lumbar region: Secondary | ICD-10-CM | POA: Diagnosis not present

## 2018-11-20 DIAGNOSIS — M545 Low back pain: Secondary | ICD-10-CM | POA: Diagnosis not present

## 2018-11-27 DIAGNOSIS — M5136 Other intervertebral disc degeneration, lumbar region: Secondary | ICD-10-CM | POA: Diagnosis not present

## 2018-11-27 DIAGNOSIS — M545 Low back pain: Secondary | ICD-10-CM | POA: Diagnosis not present

## 2018-12-01 ENCOUNTER — Encounter: Payer: Self-pay | Admitting: Internal Medicine

## 2018-12-02 DIAGNOSIS — Z86711 Personal history of pulmonary embolism: Secondary | ICD-10-CM | POA: Diagnosis not present

## 2018-12-02 DIAGNOSIS — D649 Anemia, unspecified: Secondary | ICD-10-CM | POA: Diagnosis not present

## 2018-12-02 DIAGNOSIS — Z0000 Encounter for general adult medical examination without abnormal findings: Secondary | ICD-10-CM | POA: Diagnosis not present

## 2018-12-02 DIAGNOSIS — M0589 Other rheumatoid arthritis with rheumatoid factor of multiple sites: Secondary | ICD-10-CM | POA: Diagnosis not present

## 2018-12-02 DIAGNOSIS — Z79899 Other long term (current) drug therapy: Secondary | ICD-10-CM | POA: Diagnosis not present

## 2018-12-02 DIAGNOSIS — E611 Iron deficiency: Secondary | ICD-10-CM | POA: Diagnosis not present

## 2018-12-02 DIAGNOSIS — E559 Vitamin D deficiency, unspecified: Secondary | ICD-10-CM | POA: Diagnosis not present

## 2018-12-02 DIAGNOSIS — M810 Age-related osteoporosis without current pathological fracture: Secondary | ICD-10-CM | POA: Diagnosis not present

## 2018-12-02 DIAGNOSIS — N390 Urinary tract infection, site not specified: Secondary | ICD-10-CM | POA: Diagnosis not present

## 2018-12-02 DIAGNOSIS — D500 Iron deficiency anemia secondary to blood loss (chronic): Secondary | ICD-10-CM | POA: Diagnosis not present

## 2018-12-02 DIAGNOSIS — I2510 Atherosclerotic heart disease of native coronary artery without angina pectoris: Secondary | ICD-10-CM | POA: Diagnosis not present

## 2018-12-12 DIAGNOSIS — M545 Low back pain: Secondary | ICD-10-CM | POA: Diagnosis not present

## 2018-12-12 DIAGNOSIS — M5136 Other intervertebral disc degeneration, lumbar region: Secondary | ICD-10-CM | POA: Diagnosis not present

## 2018-12-23 DIAGNOSIS — M5136 Other intervertebral disc degeneration, lumbar region: Secondary | ICD-10-CM | POA: Diagnosis not present

## 2018-12-23 DIAGNOSIS — M545 Low back pain: Secondary | ICD-10-CM | POA: Diagnosis not present

## 2018-12-30 DIAGNOSIS — K210 Gastro-esophageal reflux disease with esophagitis: Secondary | ICD-10-CM | POA: Diagnosis not present

## 2018-12-30 DIAGNOSIS — Z86010 Personal history of colonic polyps: Secondary | ICD-10-CM | POA: Diagnosis not present

## 2018-12-30 DIAGNOSIS — D500 Iron deficiency anemia secondary to blood loss (chronic): Secondary | ICD-10-CM | POA: Diagnosis not present

## 2018-12-30 DIAGNOSIS — E611 Iron deficiency: Secondary | ICD-10-CM | POA: Insufficient documentation

## 2019-01-13 DIAGNOSIS — Z1159 Encounter for screening for other viral diseases: Secondary | ICD-10-CM | POA: Diagnosis not present

## 2019-01-16 DIAGNOSIS — K2960 Other gastritis without bleeding: Secondary | ICD-10-CM | POA: Diagnosis not present

## 2019-01-16 DIAGNOSIS — D509 Iron deficiency anemia, unspecified: Secondary | ICD-10-CM | POA: Diagnosis not present

## 2019-01-16 DIAGNOSIS — K449 Diaphragmatic hernia without obstruction or gangrene: Secondary | ICD-10-CM | POA: Diagnosis not present

## 2019-01-16 DIAGNOSIS — K210 Gastro-esophageal reflux disease with esophagitis: Secondary | ICD-10-CM | POA: Diagnosis not present

## 2019-01-16 DIAGNOSIS — Z86010 Personal history of colonic polyps: Secondary | ICD-10-CM | POA: Diagnosis not present

## 2019-01-16 DIAGNOSIS — K5730 Diverticulosis of large intestine without perforation or abscess without bleeding: Secondary | ICD-10-CM | POA: Diagnosis not present

## 2019-01-16 LAB — HM COLONOSCOPY

## 2019-01-29 ENCOUNTER — Ambulatory Visit
Admission: EM | Admit: 2019-01-29 | Discharge: 2019-01-29 | Disposition: A | Discharge status: Home / Self Care | Payer: Medicare HMO | Attending: Physician Assistant | Admitting: Physician Assistant

## 2019-01-29 DIAGNOSIS — B029 Zoster without complications: Secondary | ICD-10-CM

## 2019-01-29 DIAGNOSIS — B9689 Other specified bacterial agents as the cause of diseases classified elsewhere: Secondary | ICD-10-CM | POA: Diagnosis not present

## 2019-01-29 DIAGNOSIS — N3090 Cystitis, unspecified without hematuria: Secondary | ICD-10-CM

## 2019-01-29 LAB — POCT URINALYSIS DIP (MANUAL ENTRY)
Bilirubin, UA: NEGATIVE
Glucose, UA: NEGATIVE mg/dL
Ketones, POC UA: NEGATIVE mg/dL
Nitrite, UA: POSITIVE — AB
Protein Ur, POC: 100 mg/dL — AB
Spec Grav, UA: 1.02 (ref 1.010–1.025)
Urobilinogen, UA: 0.2 E.U./dL
pH, UA: 7.5 (ref 5.0–8.0)

## 2019-01-29 MED ORDER — VALACYCLOVIR HCL 1 G PO TABS: 1000.0000 mg | ORAL_TABLET | Freq: Three times a day (TID) | ORAL | 0 refills | Status: AC

## 2019-01-29 MED ORDER — CEPHALEXIN 500 MG PO CAPS: 500.0000 mg | ORAL_CAPSULE | Freq: Four times a day (QID) | ORAL | 0 refills | Status: DC

## 2019-01-29 NOTE — ED Provider Notes (Signed)
[name]    CSN: [id number] Arrival date & time: 01/29/19  1633     History   Chief Complaint Chief Complaint  Patient presents with  . Urinary Tract Infection    HPI Janice [name] is a 73 y.o. female.   73 year old female comes in for multiple complaints.  1 week history of urinary frequency, lower back pain.  Now having some chills without obvious fever.  Denies dysuria, hematuria.  Denies abdominal pain, nausea, vomiting.  She has mild vaginal discharge, denies vaginal itching.  States discharge can have an odor intermittently.  She is postmenopausal.  1 week history of rash to the right side of her face, that she thinks is due to shingles.  She has had shingles in the past, states did not remember getting medications for the shingles, and therefore did not get seen.  Rash has not been spreading, and has been "drying out".  Denies rash to the eyes, eye redness, eye pain, vision changes.  Denies rash to the ear/nose.     Past Medical History:  Diagnosis Date  . Acute myocardial infarction, unspecified site, episode of care unspecified   . Arthritis, rheumatoid (HCC)    hx  . Back pain, chronic   . Pulmonary embolism ([place])   . Scoliosis     Patient Active Problem List   Diagnosis Date Noted  . 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 BACK PAIN, CHRONIC 06/01/2009  . SCOLIOSIS 06/01/2009  . ARTHRITIS, RHEUMATOID, HX OF 06/01/2009    Past Surgical History:  Procedure Laterality Date  . COLONOSCOPY      OB History   No obstetric history on file.      Home Medications    Prior to Admission medications   Medication Sig Start Date End Date Taking? Authorizing Provider  aspirin 325 MG tablet Take 325 mg by mouth daily.      [provider]  cephALEXin (KEFLEX) 500 MG capsule Take 1 capsule (500 mg total) by mouth 4 (four) times daily. 01/29/19   Tasia Catchings, Levonne Carreras V, PA-C  methotrexate (RHEUMATREX) 2.5 MG tablet  Take 2.5 mg by mouth once a week. 10 tabs every Tuesday     [provider]  Multiple Vitamins-Minerals (MULTIVITAL) tablet Take 1 tablet by mouth daily.      [provider]  valACYclovir (VALTREX) 1000 MG tablet Take 1 tablet (1,000 mg total) by mouth 3 (three) times daily for 7 days. 01/29/19 02/05/19  Ok Edwards, PA-C    Family History Family History  Problem Relation Age of Onset  . Cancer Mother     Social History Social History   Tobacco Use  . Smoking status: Former Research scientist (life sciences)  . Smokeless tobacco: Never Used  . Tobacco comment: has 7 plus pack-year smoking history, but quit in 1981.   Substance Use Topics  . Alcohol use: Not Currently    Comment: rare etoh   . Drug use: No     Allergies   Patient has no known allergies.   Review of Systems Review of Systems  Reason unable to perform ROS: See HPI as above.     Physical Exam Triage Vital Signs ED Triage Vitals  Enc Vitals Group     BP 01/29/19 1642 117/65     Pulse Rate 01/29/19 1642 (!) 107     Resp 01/29/19 1642 18     Temp 01/29/19 1642 98.1 F (36.7 C)     Temp Source 01/29/19 1642 Oral  SpO2 01/29/19 1642 96 %     Weight --      Height --      Head Circumference --      Peak Flow --      Pain Score 01/29/19 1643 0     Pain Loc --      Pain Edu? --      Excl. in [place]? --    No data found.  Updated Vital Signs BP 117/65 (BP Location: Left Arm)   Pulse (!) 107   Temp 98.1 F (36.7 C) (Oral)   Resp 18   SpO2 96%   Physical Exam Constitutional:      General: She is not in acute distress.    Appearance: She is well-developed. She is not ill-appearing, toxic-appearing or diaphoretic.  HENT:     Head: Normocephalic and atraumatic.     Comments: Maculopapular rash to the right cheek, spreading down to right lip. Does not cross midline. No obvious vesicles seen, though patient states was vesicular when symptoms first started. Rash does not extend to the eye, tip of nose, ear.  Sensation intact and equal bilaterally.     Mouth/Throat:     Mouth: Mucous membranes are moist. No oral lesions.     Pharynx: Oropharynx is clear. Uvula midline.  Eyes:     General: Lids are normal.     Extraocular Movements: Extraocular movements intact.     Conjunctiva/sclera: Conjunctivae normal.     Pupils: Pupils are equal, round, and reactive to light.  Cardiovascular:     Rate and Rhythm: Normal rate and regular rhythm.     Heart sounds: Normal heart sounds. No murmur. No friction rub. No gallop.   Pulmonary:     Effort: Pulmonary effort is normal.     Breath sounds: Normal breath sounds. No wheezing or rales.  Abdominal:     General: Bowel sounds are normal.     Palpations: Abdomen is soft.     Tenderness: There is no abdominal tenderness. There is no right CVA tenderness, left CVA tenderness, guarding or rebound.  Skin:    General: Skin is warm and dry.  Neurological:     Mental Status: She is alert and oriented to person, place, and time.  Psychiatric:        Behavior: Behavior normal.        Judgment: Judgment normal.      UC Treatments / Results  Labs (all labs ordered are listed, but only abnormal results are displayed) Labs Reviewed  POCT URINALYSIS DIP (MANUAL ENTRY) - Abnormal; Notable for the following components:      Result Value   Clarity, UA cloudy (*)    Blood, UA small (*)    Protein Ur, POC =100 (*)    Nitrite, UA Positive (*)    Leukocytes, UA Large (3+) (*)    All other components within normal limits  CERVICOVAGINAL ANCILLARY ONLY    EKG   Radiology No results found.  Procedures Procedures (including critical care time)  Medications Ordered in UC Medications - No data to display  Initial Impression / Assessment and Plan / UC Course  I have reviewed the triage vital signs and the nursing notes.  Pertinent labs & imaging results that were available during my care of the patient were reviewed by me and considered in my medical  decision making (see chart for details).    Urine positive for nitrite and leukocyte.  Patient did not provide enough urine sample  for urine culture, and was unable to urinate again.  We will treat for urinary tract infection with Keflex at this time.  Discussed with patient if symptoms does not resolve, or returns after finishing antibiotics, will need an urine culture in the future.  Return precautions given.  Will have patient start Valtrex.  Discussed symptoms to watch out for, including rash spreading to the nose, ear, eyes.  Will have patient recheck with primary care in 1 week if rash does not resolve.  Return precautions given.  Patient expresses understanding and agrees to plan.  Final Clinical Impressions(s) / UC Diagnoses   Final diagnoses:  Herpes zoster without complication  Cystitis   ED Prescriptions    Medication Sig Dispense Auth. Provider   cephALEXin (KEFLEX) 500 MG capsule Take 1 capsule (500 mg total) by mouth 4 (four) times daily. 28 capsule Charman Blasco V, PA-C   valACYclovir (VALTREX) 1000 MG tablet Take 1 tablet (1,000 mg total) by mouth 3 (three) times daily for 7 days. 21 tablet Tobin Chad, [place] 01/29/19 1734

## 2019-01-29 NOTE — Discharge Instructions (Addendum)
Urine positive for urinary tract infection.  Start Keflex as directed. Keep hydrated, urine should be clear to pale yellow in color.  Cytology sent, you will be contacted with any positive results, any additional treatment required will be called in at that time.  Start Valtrex for shingles to the face.  Monitor your rash, if it spreads to the tip of your nose, your eyes, your ears, or if you have changes in taste, red lesions in your mouth, go to the emergency department for further evaluation.

## 2019-01-29 NOTE — ED Triage Notes (Signed)
Pt c/o urinary difficulty with lower back pain x1wk. Pt also c/o shingles to rt side of face x1wk.

## 2019-02-01 LAB — CERVICOVAGINAL ANCILLARY ONLY
Bacterial vaginitis: NEGATIVE
Candida vaginitis: NEGATIVE
Chlamydia: NEGATIVE
Neisseria Gonorrhea: NEGATIVE
Trichomonas: NEGATIVE

## 2019-02-17 ENCOUNTER — Encounter: Payer: Self-pay | Admitting: Cardiology

## 2019-02-17 ENCOUNTER — Ambulatory Visit: Payer: Medicare HMO | Admitting: Cardiology

## 2019-02-17 ENCOUNTER — Other Ambulatory Visit: Payer: Self-pay

## 2019-02-17 VITALS — BP 132/85 | HR 72 | Temp 97.7°F | Ht 66.0 in | Wt 215.8 lb

## 2019-02-17 DIAGNOSIS — R0602 Shortness of breath: Secondary | ICD-10-CM

## 2019-02-17 DIAGNOSIS — R600 Localized edema: Secondary | ICD-10-CM

## 2019-02-17 DIAGNOSIS — I25119 Atherosclerotic heart disease of native coronary artery with unspecified angina pectoris: Secondary | ICD-10-CM

## 2019-02-17 DIAGNOSIS — I252 Old myocardial infarction: Secondary | ICD-10-CM | POA: Diagnosis not present

## 2019-02-17 DIAGNOSIS — R002 Palpitations: Secondary | ICD-10-CM

## 2019-02-17 MED ORDER — NITROGLYCERIN 0.4 MG SL SUBL: 0.4000 mg | SUBLINGUAL_TABLET | SUBLINGUAL | 3 refills | Status: DC | PRN

## 2019-02-17 NOTE — Progress Notes (Signed)
Cardiology Office Note:    Date:  02/17/2019   ID:  Janice, [name] 1945/11/01, MRN [id number]  PCP:  [name], MD  Cardiologist:  Buford Dresser, MD PhD  Referring MD: [name], MD   CC: establish care  History of Present Illness:    Janice [name] is a 73 y.o. female with a hx of CAD with MI 2010, rheumatoid arthritis, history of prior PE, anemia of unclear etiology who is seen as a new consult at the request of [name], MD for the evaluation and management of CAD.  Today: Reviewed prior cardiac history. Had MI in 2010, cath'ed by Dr. Angelena Form. Last saw Dr. Verl Blalock in 2011. Has not had routine cardiology follow up since that time.  Feels tired all the time. Can walk to the mailbox and back, feels short of breath. Not new. No chest pain, but didn't have chest pain at the time of her MI. Was having rapid beats at that time. Had not had palpitations/rapid heart heat in a long time, but it recurred last week, lasted 20 seconds. No syncope. No associated symptoms. No clear trigger, nothing seemed to make it better/worse.  Has anemia, had EGD/colonscopy without clear etiology of anemia. Following closely with GI team.  Denies chest pain, shortness of breath at rest. No PND, orthopnea, or unexpected weight gain. No syncope. Does endorse intermittent LE edema.  Past Medical History:  Diagnosis Date  . Acute myocardial infarction, unspecified site, episode of care unspecified   . Arthritis, rheumatoid (HCC)    hx  . Back pain, chronic   . Pulmonary embolism ([place])   . Scoliosis     Past Surgical History:  Procedure Laterality Date  . COLONOSCOPY      Current Medications: Current Outpatient Medications on File Prior to Visit  Medication Sig  . aspirin EC 81 MG tablet Take 81 mg by mouth daily.  . Biotin 5000 MCG TABS Take by mouth.  . celecoxib (CELEBREX) 200 MG capsule TAKE 1 CAPSULE BY MOUTH EVERY DAY AS NEEDED FOR PAIN  . famotidine (PEPCID) 20 MG tablet  Take 20 mg by mouth 2 (two) times daily as needed.  . Folic Acid (FOLATE PO) Take by mouth.  . Menaquinone-7 (VITAMIN K2 PO) Take by mouth.  . methotrexate (RHEUMATREX) 2.5 MG tablet Take 2.5 mg by mouth once a week. 10 tabs every Tuesday   . Multiple Vitamins-Minerals (MULTIVITAL) tablet Take 1 tablet by mouth daily.    . predniSONE (DELTASONE) 5 MG tablet TAKE 1 TABLET BY MOUTH EVERY DAY WITH FOOD OR MILK   No current facility-administered medications on file prior to visit.      Allergies:   Patient has no known allergies.   Social History   Socioeconomic History  . Marital status: Married    Spouse name: Not on file  . Number of children: Not on file  . Years of education: Not on file  . Highest education level: Not on file  Occupational History  . Not on file  Social Needs  . Financial resource strain: Not on file  . Food insecurity    Worry: Not on file    Inability: Not on file  . Transportation needs    Medical: Not on file    Non-medical: Not on file  Tobacco Use  . Smoking status: Former Research scientist (life sciences)  . Smokeless tobacco: Never Used  . Tobacco comment: has 7 plus pack-year smoking history, but quit in 1981.   Substance and  Sexual Activity  . Alcohol use: Not Currently    Comment: rare etoh   . Drug use: No  . Sexual activity: Not on file  Lifestyle  . Physical activity    Days per week: Not on file    Minutes per session: Not on file  . Stress: Not on file  Relationships  . Social Herbalist on phone: Not on file    Gets together: Not on file    Attends religious service: Not on file    Active member of club or organization: Not on file    Attends meetings of clubs or organizations: Not on file    Relationship status: Not on file  Other Topics Concern  . Not on file  Social History Narrative   Lives in [place] with her daughter. She works full time in a [employer], doing nonexertional work. No herbal medications, regular diet. No  regular exercise.      Family History: The patient's family history includes Cancer in her mother.  ROS:   Please see the history of present illness.  Additional pertinent ROS: Constitutional: Negative for chills, fever, night sweats, unintentional weight loss  HENT: Negative for ear pain and hearing loss.   Eyes: Negative for loss of vision and eye pain.  Respiratory: Negative for cough, sputum, wheezing.   Cardiovascular: See HPI. Gastrointestinal: Negative for abdominal pain, melena, and hematochezia. Stool is dark 2/2 iron but not sticky. Genitourinary: Negative for dysuria and hematuria.  Musculoskeletal: Negative for falls and myalgias. Positive for joint aches Skin: Negative for itching and rash.  Neurological: Negative for focal weakness, focal sensory changes and loss of consciousness.  Endo/Heme/Allergies: Does bruise/bleed easily.     EKGs/Labs/Other Studies Reviewed:    The following studies were reviewed today: Cath 05/17/2009: LM nonobstructive CAD LAD sequential 30% lesions and distal diffuse plaque LCx no obstructive disease RCA no obstructive disease  Cardiac Cath Findings:   Left ventricular angiogram was performed in the RAO projection       which shows mild global left ventricular systolic dysfunction with       ejection fraction of 45%.  No significant mitral regurgitation is       noted.      IMPRESSION:   1. Mild nonobstructive coronary artery disease.   2. Mild global left ventricular systolic dysfunction.      RECOMMENDATIONS:  I recommend medical management at this time.  We will   start a statin medication as well as a low dose of an ACE inhibitor.   The patient will be treated with Plavix for 1 month given her acute   coronary syndrome during this presentation.  We will check an   echocardiogram today.  She will be moved to a telemetry bed with her   heart rhythm monitored over the next 24 hours.   Echo 05/21/2009  - Left ventricle: The  cavity size was normal. Systolic function was   normal. The estimated ejection fraction was in the range of 55% to   60%. Wall motion was normal; there were no regional wall motion   abnormalities. Doppler parameters are consistent with abnormal   left ventricular relaxation (grade 1 diastolic dysfunction).  - Right ventricle: The cavity size was mildly dilated.  - Right atrium: The atrium was mildly dilated.  Impressions:   - Echobright area at RV apex on apical four chamber view most likely   represents prominent trabeculae.   EKG:  EKG is  personally reviewed.  The ekg ordered today demonstrates NSR, PRWP  Recent Labs: No results found for requested labs within last 8760 hours.  Recent Lipid Panel    Component Value Date/Time   CHOL  05/17/2009 1230    151        ATP III CLASSIFICATION:  <200     mg/dL   Desirable  200-239  mg/dL   Borderline High  >=240    mg/dL   High          TRIG 76 05/17/2009 1230   HDL 58 05/17/2009 1230   CHOLHDL 2.6 05/17/2009 1230   VLDL 15 05/17/2009 1230   LDLCALC  05/17/2009 1230    78        Total Cholesterol/HDL:CHD Risk Coronary Heart Disease Risk Table                     Men   Women  1/2 Average Risk   3.4   3.3  Average Risk       5.0   4.4  2 X Average Risk   9.6   7.1  3 X Average Risk  23.4   11.0        Use the calculated Patient Ratio above and the CHD Risk Table to determine the patient's CHD Risk.        ATP III CLASSIFICATION (LDL):  <100     mg/dL   Optimal  100-129  mg/dL   Near or Above                    Optimal  130-159  mg/dL   Borderline  160-189  mg/dL   High  >190     mg/dL   Very High    Physical Exam:    VS:  BP 132/85   Pulse 72   Temp 97.7 F (36.5 C)   Ht 5\' 6"  (1.676 m)   Wt 215 lb 12.5 oz (97.9 kg)   SpO2 95%   BMI 34.83 kg/m     Wt Readings from Last 3 Encounters:  02/17/19 215 lb 12.5 oz (97.9 kg)     GEN: Well nourished, well developed in no acute distress HEENT: Normal,  moist mucous membranes NECK: No JVD CARDIAC: regular rhythm, normal S1 and S2, no murmurs, rubs, gallops.  VASCULAR: Radial and DP pulses 2+ bilaterally. No carotid bruits RESPIRATORY:  Clear to auscultation without rales, wheezing or rhonchi  ABDOMEN: Soft, non-tender, non-distended MUSCULOSKELETAL:  Ambulates independently SKIN: Warm and dry, bilateral trace LE edema NEUROLOGIC:  Alert and oriented x 3. No focal neuro deficits noted. PSYCHIATRIC:  Normal affect    ASSESSMENT:    1. SOB (shortness of breath)   2. Lower extremity edema   3. Coronary artery disease involving native heart with angina pectoris, unspecified vessel or lesion type ([place])   4. Heart palpitations   5. History of MI (myocardial infarction)    PLAN:    Dyspnea on exertion, LE edema: not new, but limiting. Has not had cardiac evaluation in 10 years. She did not have typical chest pain/anginal symptoms prior to her MI in 2010, so this may be an anginal equivalent. -will get echocardiograpm to assess structure and function -will refill SL NG, instructed on use -discussed treadmill stress, nuclear stress/lexiscan, and CT coronary angiography. Discussed pros and cons of each, including but not limited to false positive/false negative risk, radiation risk, and risk of IV contrast dye. Based on shared  decision making, decision was made to pursue lexiscan stress test.  Palpitations: currently infrequent, but they were her only symptoms prior to her MI -CV eval as above -if they become more frequent, would pursue Zio monitor -TSH normal on bloodwork  History of MI: prior nonobstructive disease -from a CV standpoint, would continue ASA 81 mg. However, if bleeding occurs or anemia worsens, can discontinue aspirin -would not use celecoxib given her prior history of MI -not on statin. She did have nonobstructive disease, but with her rheumatoid arthritis she is higher risk for vascular disease -per review of notes, she  was started on pravastatin 10 mg per her PCP. I recommend statin as well, preferably a higher intensity given her history, but any statin would be beneficial. She will discuss with her PCP -lipids per PCP labs: Tchol 156, TG 77, HDL 64, LDL 77. Would like goal LDL <70.  Cardiac risk counseling and prevention recommendations: -recommend heart healthy/Mediterranean diet, with whole grains, fruits, vegetable, fish, lean meats, nuts, and olive oil. Limit salt. -recommend moderate walking, 3-5 times/week for 30-50 minutes each session. Aim for at least 150 minutes.week. Goal should be pace of 3 miles/hours, or walking 1.5 miles in 30 minutes -recommend avoidance of tobacco products. Avoid excess alcohol.  Plan for follow up: 3 mos or sooner PRN  Medication Adjustments/Labs and Tests Ordered: Current medicines are reviewed at length with the patient today.  Concerns regarding medicines are outlined above.  Orders Placed This Encounter  Procedures  . MYOCARDIAL PERFUSION IMAGING  . EKG 12-Lead  . ECHOCARDIOGRAM COMPLETE   Meds ordered this encounter  Medications  . nitroGLYCERIN (NITROSTAT) 0.4 MG SL tablet    Sig: Place 1 tablet (0.4 mg total) under the tongue every 5 (five) minutes as needed for chest pain.    Dispense:  25 tablet    Refill:  3    Patient Instructions  Medication Instructions:  Your Physician recommend you continue on your current medication as directed.    If you need a refill on your cardiac medications before your next appointment, please call your pharmacy.   Lab work: None  Testing/Procedures: Your physician has requested that you have an echocardiogram. Echocardiography is a painless test that uses sound waves to create images of your heart. It provides your doctor with information about the size and shape of your heart and how well your heart's chambers and valves are working. This procedure takes approximately one hour. There are no restrictions for this  procedure. [place] has requested that you have a lexiscan myoview. For further information please visit [email or web address]. Please follow instruction sheet, as given. [place] [address]   Follow-Up: At [facility], you and your health needs are our priority.  As part of our continuing mission to provide you with exceptional heart care, we have created designated Provider Care Teams.  These Care Teams include your primary Cardiologist (physician) and Advanced Practice Providers (APPs -  Physician Assistants and Nurse Practitioners) who all work together to provide you with the care you need, when you need it. You will need a follow up appointment in 3 months.  Please call our office 2 months in advance to schedule this appointment.  You may see Dr. Harrell Gave or one of the following Advanced Practice Providers on your designated Care Team:   Rosaria Ferries, PA-C . Jory Sims, DNP, ANP  You are scheduled for a Myocardial Perfusion Imaging Study  Please  arrive 15 minutes prior to your appointment time for registration and insurance purposes.  The test will take approximately 3 to 4 hours to complete; you may bring reading material.  If someone comes with you to your appointment, they will need to remain in the main lobby due to limited space in the testing area. **If you are pregnant or breastfeeding, please notify the nuclear lab prior to your appointment**  How to prepare for your Myocardial Perfusion Test: . Do not eat or drink 3 hours prior to your test, except you may have water. . Do not consume products containing caffeine (regular or decaffeinated) 12 hours prior to your test. (ex: coffee, chocolate, sodas, tea). . Do bring a list of your current medications with you.  If not listed below, you may take your medications as normal. . Do wear comfortable clothes (no dresses or overalls) and walking shoes, tennis shoes preferred  (No heels or open toe shoes are allowed). . Do NOT wear cologne, perfume, aftershave, or lotions (deodorant is allowed). . If these instructions are not followed, your test will have to be rescheduled.  Please report to [address], [address] for your test.  If you have questions or concerns about your appointment, you can call the Nuclear Lab at [phone].  If you cannot keep your appointment, please provide 24 hours notification to the Nuclear Lab, to avoid a possible $50 charge to your account.       Signed, Buford Dresser, MD PhD 02/17/2019  [place]

## 2019-02-17 NOTE — Patient Instructions (Signed)
Medication Instructions:  Your Physician recommend you continue on your current medication as directed.    If you need a refill on your cardiac medications before your next appointment, please call your pharmacy.   Lab work: None  Testing/Procedures: Your physician has requested that you have an echocardiogram. Echocardiography is a painless test that uses sound waves to create images of your heart. It provides your doctor with information about the size and shape of your heart and how well your heart's chambers and valves are working. This procedure takes approximately one hour. There are no restrictions for this procedure. [place] has requested that you have a lexiscan myoview. For further information please visit [email or web address]. Please follow instruction sheet, as given. [place] [address]   Follow-Up: At [facility], you and your health needs are our priority.  As part of our continuing mission to provide you with exceptional heart care, we have created designated Provider Care Teams.  These Care Teams include your primary Cardiologist (physician) and Advanced Practice Providers (APPs -  Physician Assistants and Nurse Practitioners) who all work together to provide you with the care you need, when you need it. You will need a follow up appointment in 3 months.  Please call our office 2 months in advance to schedule this appointment.  You may see Dr. Harrell Gave or one of the following Advanced Practice Providers on your designated Care Team:   Rosaria Ferries, PA-C . Jory Sims, DNP, ANP  You are scheduled for a Myocardial Perfusion Imaging Study  Please arrive 15 minutes prior to your appointment time for registration and insurance purposes.  The test will take approximately 3 to 4 hours to complete; you may bring reading material.  If someone comes with you to your appointment, they will need to remain in the main lobby  due to limited space in the testing area. **If you are pregnant or breastfeeding, please notify the nuclear lab prior to your appointment**  How to prepare for your Myocardial Perfusion Test: . Do not eat or drink 3 hours prior to your test, except you may have water. . Do not consume products containing caffeine (regular or decaffeinated) 12 hours prior to your test. (ex: coffee, chocolate, sodas, tea). . Do bring a list of your current medications with you.  If not listed below, you may take your medications as normal. . Do wear comfortable clothes (no dresses or overalls) and walking shoes, tennis shoes preferred (No heels or open toe shoes are allowed). . Do NOT wear cologne, perfume, aftershave, or lotions (deodorant is allowed). . If these instructions are not followed, your test will have to be rescheduled.  Please report to [address], [address] for your test.  If you have questions or concerns about your appointment, you can call the Nuclear Lab at [phone].  If you cannot keep your appointment, please provide 24 hours notification to the Nuclear Lab, to avoid a possible $50 charge to your account.

## 2019-02-19 DIAGNOSIS — Z86711 Personal history of pulmonary embolism: Secondary | ICD-10-CM | POA: Diagnosis not present

## 2019-02-19 DIAGNOSIS — M8580 Other specified disorders of bone density and structure, unspecified site: Secondary | ICD-10-CM | POA: Diagnosis not present

## 2019-02-19 DIAGNOSIS — I2510 Atherosclerotic heart disease of native coronary artery without angina pectoris: Secondary | ICD-10-CM | POA: Diagnosis not present

## 2019-02-19 DIAGNOSIS — Z79899 Other long term (current) drug therapy: Secondary | ICD-10-CM | POA: Diagnosis not present

## 2019-02-19 DIAGNOSIS — M25571 Pain in right ankle and joints of right foot: Secondary | ICD-10-CM | POA: Diagnosis not present

## 2019-02-19 DIAGNOSIS — M150 Primary generalized (osteo)arthritis: Secondary | ICD-10-CM | POA: Diagnosis not present

## 2019-02-19 DIAGNOSIS — M810 Age-related osteoporosis without current pathological fracture: Secondary | ICD-10-CM | POA: Diagnosis not present

## 2019-02-19 DIAGNOSIS — M0589 Other rheumatoid arthritis with rheumatoid factor of multiple sites: Secondary | ICD-10-CM | POA: Diagnosis not present

## 2019-02-19 DIAGNOSIS — M5136 Other intervertebral disc degeneration, lumbar region: Secondary | ICD-10-CM | POA: Diagnosis not present

## 2019-02-19 DIAGNOSIS — M545 Low back pain: Secondary | ICD-10-CM | POA: Diagnosis not present

## 2019-02-24 ENCOUNTER — Encounter: Payer: Self-pay | Admitting: Cardiology

## 2019-02-24 DIAGNOSIS — R0602 Shortness of breath: Secondary | ICD-10-CM | POA: Insufficient documentation

## 2019-02-24 DIAGNOSIS — R600 Localized edema: Secondary | ICD-10-CM | POA: Insufficient documentation

## 2019-02-24 DIAGNOSIS — R002 Palpitations: Secondary | ICD-10-CM | POA: Insufficient documentation

## 2019-02-24 DIAGNOSIS — I252 Old myocardial infarction: Secondary | ICD-10-CM | POA: Insufficient documentation

## 2019-02-24 DIAGNOSIS — I25119 Atherosclerotic heart disease of native coronary artery with unspecified angina pectoris: Secondary | ICD-10-CM | POA: Insufficient documentation

## 2019-02-25 ENCOUNTER — Telehealth (HOSPITAL_COMMUNITY): Payer: Self-pay | Admitting: *Deleted

## 2019-02-25 NOTE — Telephone Encounter (Signed)
Patient given detailed instructions per Myocardial Perfusion Study Information Sheet for the test on 03/05/2019 at 1000. Patient notified to arrive 15 minutes early and that it is imperative to arrive on time for appointment to keep from having the test rescheduled.  If you need to cancel or reschedule your appointment, please call the office within 24 hours of your appointment. . Patient verbalized understanding. Darvis Croft, [place] No my chart

## 2019-03-05 ENCOUNTER — Ambulatory Visit (HOSPITAL_COMMUNITY): Payer: Medicare HMO | Attending: Cardiology

## 2019-03-05 ENCOUNTER — Telehealth: Payer: Self-pay | Admitting: Cardiology

## 2019-03-05 ENCOUNTER — Encounter (INDEPENDENT_AMBULATORY_CARE_PROVIDER_SITE_OTHER): Payer: Self-pay

## 2019-03-05 ENCOUNTER — Other Ambulatory Visit: Payer: Self-pay

## 2019-03-05 ENCOUNTER — Ambulatory Visit (HOSPITAL_BASED_OUTPATIENT_CLINIC_OR_DEPARTMENT_OTHER): Payer: Medicare HMO

## 2019-03-05 VITALS — Ht 66.0 in | Wt 215.0 lb

## 2019-03-05 DIAGNOSIS — R0602 Shortness of breath: Secondary | ICD-10-CM

## 2019-03-05 DIAGNOSIS — R600 Localized edema: Secondary | ICD-10-CM | POA: Diagnosis not present

## 2019-03-05 DIAGNOSIS — I25119 Atherosclerotic heart disease of native coronary artery with unspecified angina pectoris: Secondary | ICD-10-CM | POA: Diagnosis not present

## 2019-03-05 LAB — MYOCARDIAL PERFUSION IMAGING
LV dias vol: 90 mL (ref 46–106)
LV sys vol: 47 mL
Peak HR: 96 {beats}/min
Rest HR: 58 {beats}/min
SDS: 1
SRS: 1
SSS: 2
TID: 0.95

## 2019-03-05 MED ORDER — REGADENOSON 0.4 MG/5ML IV SOLN
0.4000 mg | Freq: Once | INTRAVENOUS | Status: AC
  Administered 2019-03-05: 0.4 mg via INTRAVENOUS

## 2019-03-05 MED ORDER — TECHNETIUM TC 99M TETROFOSMIN IV KIT
31.3000 | PACK | Freq: Once | INTRAVENOUS | Status: AC | PRN
  Administered 2019-03-05: 31.3 via INTRAVENOUS
  Filled 2019-03-05: qty 32

## 2019-03-05 MED ORDER — TECHNETIUM TC 99M TETROFOSMIN IV KIT
10.7000 | PACK | Freq: Once | INTRAVENOUS | Status: AC | PRN
  Administered 2019-03-05: 10.7 via INTRAVENOUS
  Filled 2019-03-05: qty 11

## 2019-03-05 NOTE — Telephone Encounter (Signed)
Pt updated with ECHO results and voiced understanding.

## 2019-03-05 NOTE — Telephone Encounter (Signed)
Called and notified patient of ECHO results.

## 2019-03-05 NOTE — Telephone Encounter (Signed)
New message   Patient is returning call for results for echo. Please call.

## 2019-05-14 ENCOUNTER — Ambulatory Visit: Payer: Medicare HMO | Admitting: Cardiology

## 2019-05-19 ENCOUNTER — Other Ambulatory Visit: Payer: Self-pay | Admitting: Dermatology

## 2019-05-19 DIAGNOSIS — Z1231 Encounter for screening mammogram for malignant neoplasm of breast: Secondary | ICD-10-CM

## 2019-06-02 ENCOUNTER — Ambulatory Visit: Payer: Medicare HMO | Admitting: Cardiology

## 2019-06-02 ENCOUNTER — Other Ambulatory Visit: Payer: Self-pay

## 2019-06-02 ENCOUNTER — Encounter: Payer: Self-pay | Admitting: Cardiology

## 2019-06-02 VITALS — BP 122/70 | HR 85 | Ht 66.0 in | Wt 217.8 lb

## 2019-06-02 DIAGNOSIS — R0609 Other forms of dyspnea: Secondary | ICD-10-CM

## 2019-06-02 DIAGNOSIS — I25119 Atherosclerotic heart disease of native coronary artery with unspecified angina pectoris: Secondary | ICD-10-CM

## 2019-06-02 DIAGNOSIS — I2510 Atherosclerotic heart disease of native coronary artery without angina pectoris: Secondary | ICD-10-CM | POA: Diagnosis not present

## 2019-06-02 DIAGNOSIS — R0600 Dyspnea, unspecified: Secondary | ICD-10-CM

## 2019-06-02 DIAGNOSIS — Z712 Person consulting for explanation of examination or test findings: Secondary | ICD-10-CM

## 2019-06-02 DIAGNOSIS — Z7189 Other specified counseling: Secondary | ICD-10-CM

## 2019-06-02 DIAGNOSIS — I252 Old myocardial infarction: Secondary | ICD-10-CM | POA: Diagnosis not present

## 2019-06-02 NOTE — Progress Notes (Signed)
Cardiology Office Note:    Date:  06/02/2019   ID:  Janice [name], Janice [name] January 16, 1946, MRN [id number]  PCP:  Janice Gravel, MD  Cardiologist:  Buford Dresser, MD PhD  Referring MD: Janice Gravel, MD   CC: follow up  History of Present Illness:    Janice [name] is a 73 y.o. female with a hx of CAD with MI 2010, rheumatoid arthritis, history of prior PE (provoked after she broke her foot/casted and then placed on OCP, told clots were small), anemia of unclear etiology who is seen as a new consult at the request of Janice Gravel, MD for the evaluation and management of CAD.  Today: Breathing remains very short when she exerts herself. Reviewed her results from echo and stress test today, both reassuring from a cardiovascular perspective.  She is not sure why she feels so short of breath. We discussed non-cardiac etiologies, and she will discuss this with her PCP. We also discussed that if her non-cardiac workup is unrevealing, we may be able to do a CPX test in the spring.  She does not have any additional information on her anemia workup. Remains fatigued. No clear source of bleeding that she knows of.  Denies chest pain, shortness of breath at rest. No PND, orthopnea, or unexpected weight gain though she does not weight routinely at home. No syncope or palpitations. Has intermittent LE edema, recently had some on left leg with redness, now resolving.   She does have a history of PE remotely, reports that this occurred after her foot was casted/immobilized, and then she was placed on hormonal treatments. She was told it was a small clot/clots. Has had no issues since.  Past Medical History:  Diagnosis Date  . Acute myocardial infarction, unspecified site, episode of care unspecified   . Arthritis, rheumatoid (HCC)    hx  . Back pain, chronic   . Pulmonary embolism ([place])   . Scoliosis     Past Surgical History:  Procedure Laterality Date  . COLONOSCOPY      Current  Medications: Current Outpatient Medications on File Prior to Visit  Medication Sig  . aspirin EC 81 MG tablet Take 81 mg by mouth daily.  . Biotin 5000 MCG TABS Take by mouth.  . celecoxib (CELEBREX) 200 MG capsule TAKE 1 CAPSULE BY MOUTH EVERY DAY AS NEEDED FOR PAIN  . famotidine (PEPCID) 20 MG tablet Take 20 mg by mouth 2 (two) times daily as needed.  . Folic Acid (FOLATE PO) Take by mouth.  . Menaquinone-7 (VITAMIN K2 PO) Take by mouth.  . methotrexate (RHEUMATREX) 2.5 MG tablet Take 2.5 mg by mouth once a week. 10 tabs every Tuesday   . Multiple Vitamins-Minerals (MULTIVITAL) tablet Take 1 tablet by mouth daily.    . nitroGLYCERIN (NITROSTAT) 0.4 MG SL tablet Place 1 tablet (0.4 mg total) under the tongue every 5 (five) minutes as needed for chest pain.  . predniSONE (DELTASONE) 5 MG tablet TAKE 1 TABLET BY MOUTH EVERY DAY WITH FOOD OR MILK   No current facility-administered medications on file prior to visit.     Allergies:   Patient has no known allergies.   Social History   Tobacco Use  . Smoking status: Former Research scientist (life sciences)  . Smokeless tobacco: Never Used  . Tobacco comment: has 7 plus pack-year smoking history, but quit in 1981.   Substance Use Topics  . Alcohol use: Not Currently    Comment: rare etoh   . Drug use: No  Family History: The patient's family history includes Cancer in her mother.  ROS:   Please see the history of present illness.  Additional pertinent ROS: Constitutional: Negative for chills, fever, night sweats, unintentional weight loss . Positive for mild fatigue HENT: Negative for ear pain and hearing loss.   Eyes: Negative for loss of vision and eye pain.  Respiratory: Negative for cough, sputum, wheezing.   Cardiovascular: See HPI. Gastrointestinal: Negative for abdominal pain, melena, and hematochezia.  Genitourinary: Negative for dysuria and hematuria.  Musculoskeletal: Negative for falls and myalgias.  Skin: Negative for itching and rash.    Neurological: Negative for focal weakness, focal sensory changes and loss of consciousness.  Endo/Heme/Allergies: Does bruise/bleed easily.   EKGs/Labs/Other Studies Reviewed:    The following studies were reviewed today: Echo 03/05/19  1. The left ventricle has normal systolic function, with an ejection fraction of 55-60%. The cavity size was normal. Left ventricular diastolic Doppler parameters are consistent with impaired relaxation.  2. The right ventricle has normal systolic function. The cavity was normal. There is no increase in right ventricular wall thickness.  3. The mitral valve is grossly normal. There is mild mitral annular calcification present.  4. The aortic valve is tricuspid. Moderate thickening of the aortic valve. Moderate calcification of the aortic valve. Aortic valve regurgitation is trivial by color flow Doppler.  5. The aorta is normal unless otherwise noted.  Lexiscan 03/05/19  Nuclear stress EF: 47%. visually, the EF appears to be greater than 47% and appears to be normal  There was no ST segment deviation noted during stress.  This is a low risk study. no evidence of ischemia or previous infarction  The study is normal.   Cath 05/17/2009: LM nonobstructive CAD LAD sequential 30% lesions and distal diffuse plaque LCx no obstructive disease RCA no obstructive disease  Cardiac Cath Findings:   Left ventricular angiogram was performed in the RAO projection       which shows mild global left ventricular systolic dysfunction with       ejection fraction of 45%.  No significant mitral regurgitation is       noted.      IMPRESSION:   1. Mild nonobstructive coronary artery disease.   2. Mild global left ventricular systolic dysfunction.      RECOMMENDATIONS:  I recommend medical management at this time.  We will   start a statin medication as well as a low dose of an ACE inhibitor.   The patient will be treated with Plavix for 1 month given her acute    coronary syndrome during this presentation.  We will check an   echocardiogram today.  She will be moved to a telemetry bed with her   heart rhythm monitored over the next 24 hours.   Echo 05/21/2009  - Left ventricle: The cavity size was normal. Systolic function was   normal. The estimated ejection fraction was in the range of 55% to   60%. Wall motion was normal; there were no regional wall motion   abnormalities. Doppler parameters are consistent with abnormal   left ventricular relaxation (grade 1 diastolic dysfunction).  - Right ventricle: The cavity size was mildly dilated.  - Right atrium: The atrium was mildly dilated.  Impressions:   - Echobright area at RV apex on apical four chamber view most likely   represents prominent trabeculae.   EKG:  EKG is personally reviewed.  The ekg ordered 02/17/19 demonstrates NSR, PRWP  Recent Labs: No results  found for requested labs within last 8760 hours.  Recent Lipid Panel    Component Value Date/Time   CHOL  05/17/2009 1230    151        ATP III CLASSIFICATION:  <200     mg/dL   Desirable  200-239  mg/dL   Borderline High  >=240    mg/dL   High          TRIG 76 05/17/2009 1230   HDL 58 05/17/2009 1230   CHOLHDL 2.6 05/17/2009 1230   VLDL 15 05/17/2009 1230   LDLCALC  05/17/2009 1230    78        Total Cholesterol/HDL:CHD Risk Coronary Heart Disease Risk Table                     Men   Women  1/2 Average Risk   3.4   3.3  Average Risk       5.0   4.4  2 X Average Risk   9.6   7.1  3 X Average Risk  23.4   11.0        Use the calculated Patient Ratio above and the CHD Risk Table to determine the patient's CHD Risk.        ATP III CLASSIFICATION (LDL):  <100     mg/dL   Optimal  100-129  mg/dL   Near or Above                    Optimal  130-159  mg/dL   Borderline  160-189  mg/dL   High  >190     mg/dL   Very High    Physical Exam:    VS:  BP 122/70   Pulse 85   Ht 5\' 6"  (1.676 m)   Wt 217 lb  12.8 oz (98.8 kg)   SpO2 94%   BMI 35.15 kg/m     Wt Readings from Last 3 Encounters:  06/02/19 217 lb 12.8 oz (98.8 kg)  03/05/19 215 lb (97.5 kg)  02/17/19 215 lb 12.5 oz (97.9 kg)    GEN: Well nourished, well developed in no acute distress HEENT: Normal, moist mucous membranes NECK: No JVD CARDIAC: regular rhythm, normal S1 and S2, no rubs or gallops. No murmur. VASCULAR: Radial and DP pulses 2+ bilaterally. No carotid bruits RESPIRATORY:  Clear to auscultation without rales, wheezing or rhonchi  ABDOMEN: Soft, non-tender, non-distended MUSCULOSKELETAL:  Ambulates independently SKIN: Warm and dry, trace bilateral LE edema NEUROLOGIC:  Alert and oriented x 3. No focal neuro deficits noted. PSYCHIATRIC:  Normal affect   ASSESSMENT:    1. Dyspnea on exertion   2. History of MI (myocardial infarction)   3. Coronary artery disease involving native heart with angina pectoris, unspecified vessel or lesion type ([place])   4. Cardiac risk counseling   5. Counseling on health promotion and disease prevention    PLAN:    Dyspnea on exertion: persistent but not progressive -echo, lexiscan reassuring -her lungs are clear, but she is asking if it may be her lungs. I recommend discussing with Dr. Maudie Mercury if she would benefit from PFTs -if no clear etiology determined after she visits with her PCP, could discuss CPX study at follow -low suspicion for PE, no suggestion of PH on echo.  History of MI, nonobstructive CAD: prior nonobstructive disease -from a CV standpoint, would continue ASA 81 mg. However, if bleeding occurs or anemia worsens, can discontinue aspirin -would not  use celecoxib given her prior history of MI, but she states this helps substantially with her pain. She prefers to jeep -not on statin. She did have nonobstructive disease, but with her rheumatoid arthritis she is higher risk for vascular disease. I recommended statin at prior visit. She wishes to discuss with her PCP.    -goal LDL <70.  Cardiac risk counseling and prevention recommendations: -recommend heart healthy/Mediterranean diet, with whole grains, fruits, vegetable, fish, lean meats, nuts, and olive oil. Limit salt. -recommend moderate walking, 3-5 times/week for 30-50 minutes each session. Aim for at least 150 minutes.week. Goal should be pace of 3 miles/hours, or walking 1.5 miles in 30 minutes -recommend avoidance of tobacco products. Avoid excess alcohol.  Plan for follow up: 4 mos or sooner PRN  TIME SPENT WITH PATIENT: 25 minutes of direct patient care. More than 50% of that time was spent on coordination of care and counseling regarding test results, recommendations on next steps.  Buford Dresser, MD, PhD [place]  [facility]   Medication Adjustments/Labs and Tests Ordered: Current medicines are reviewed at length with the patient today.  Concerns regarding medicines are outlined above.  No orders of the defined types were placed in this encounter.  No orders of the defined types were placed in this encounter.   Patient Instructions  Medication Instructions: No changes *If you need a refill on your cardiac medications before your next appointment, please call your pharmacy*  Lab Work: none order at this time  Testing/Procedures: none at this time  Follow-Up: At [facility], you and your health needs are our priority.  As part of our continuing mission to provide you with exceptional heart care, we have created designated Provider Care Teams.  These Care Teams include your primary Cardiologist (physician) and Advanced Practice Providers (APPs -  Physician Assistants and Nurse Practitioners) who all work together to provide you with the care you need, when you need it.  Your next appointment:   4 month(s)/ April 2021  The format for your next appointment:   In Person  Provider:   Buford Dresser, MD  Other Instructions PLEASE FOLLOW UP WITH YOUR PRIMARY  CARE DOCTOR REGARDING THE NEED FOR A PULMONARY FUNCTION TEST. ( PFT).      Signed, Buford Dresser, MD PhD 06/02/2019  [place]

## 2019-06-02 NOTE — Patient Instructions (Signed)
Medication Instructions: No changes *If you need a refill on your cardiac medications before your next appointment, please call your pharmacy*  Lab Work: none order at this time  Testing/Procedures: none at this time  Follow-Up: At [facility], you and your health needs are our priority.  As part of our continuing mission to provide you with exceptional heart care, we have created designated Provider Care Teams.  These Care Teams include your primary Cardiologist (physician) and Advanced Practice Providers (APPs -  Physician Assistants and Nurse Practitioners) who all work together to provide you with the care you need, when you need it.  Your next appointment:   4 month(s)/ April 2021  The format for your next appointment:   In Person  Provider:   Buford Dresser, MD  Other Instructions PLEASE FOLLOW UP WITH YOUR PRIMARY CARE DOCTOR REGARDING THE NEED FOR A PULMONARY FUNCTION TEST. ( PFT).

## 2019-06-03 ENCOUNTER — Other Ambulatory Visit: Payer: Self-pay | Admitting: Cardiology

## 2019-06-03 NOTE — Telephone Encounter (Signed)
Rx request sent to pharmacy.  

## 2019-06-16 DIAGNOSIS — M545 Low back pain: Secondary | ICD-10-CM | POA: Diagnosis not present

## 2019-06-16 DIAGNOSIS — Z86711 Personal history of pulmonary embolism: Secondary | ICD-10-CM | POA: Diagnosis not present

## 2019-06-16 DIAGNOSIS — Z79899 Other long term (current) drug therapy: Secondary | ICD-10-CM | POA: Diagnosis not present

## 2019-06-16 DIAGNOSIS — I2510 Atherosclerotic heart disease of native coronary artery without angina pectoris: Secondary | ICD-10-CM | POA: Diagnosis not present

## 2019-06-16 DIAGNOSIS — M25571 Pain in right ankle and joints of right foot: Secondary | ICD-10-CM | POA: Diagnosis not present

## 2019-06-16 DIAGNOSIS — M0630 Rheumatoid nodule, unspecified site: Secondary | ICD-10-CM | POA: Diagnosis not present

## 2019-06-16 DIAGNOSIS — M5136 Other intervertebral disc degeneration, lumbar region: Secondary | ICD-10-CM | POA: Diagnosis not present

## 2019-06-16 DIAGNOSIS — M150 Primary generalized (osteo)arthritis: Secondary | ICD-10-CM | POA: Diagnosis not present

## 2019-06-16 DIAGNOSIS — M810 Age-related osteoporosis without current pathological fracture: Secondary | ICD-10-CM | POA: Diagnosis not present

## 2019-06-16 DIAGNOSIS — M0589 Other rheumatoid arthritis with rheumatoid factor of multiple sites: Secondary | ICD-10-CM | POA: Diagnosis not present

## 2019-06-21 DIAGNOSIS — Z20828 Contact with and (suspected) exposure to other viral communicable diseases: Secondary | ICD-10-CM | POA: Diagnosis not present

## 2019-07-02 DIAGNOSIS — Z20828 Contact with and (suspected) exposure to other viral communicable diseases: Secondary | ICD-10-CM | POA: Diagnosis not present

## 2019-07-14 DIAGNOSIS — E7800 Pure hypercholesterolemia, unspecified: Secondary | ICD-10-CM | POA: Diagnosis not present

## 2019-07-14 DIAGNOSIS — N390 Urinary tract infection, site not specified: Secondary | ICD-10-CM | POA: Diagnosis not present

## 2019-07-14 DIAGNOSIS — D509 Iron deficiency anemia, unspecified: Secondary | ICD-10-CM | POA: Diagnosis not present

## 2019-07-14 DIAGNOSIS — I10 Essential (primary) hypertension: Secondary | ICD-10-CM | POA: Diagnosis not present

## 2019-07-14 DIAGNOSIS — Z0000 Encounter for general adult medical examination without abnormal findings: Secondary | ICD-10-CM | POA: Diagnosis not present

## 2019-07-21 DIAGNOSIS — K219 Gastro-esophageal reflux disease without esophagitis: Secondary | ICD-10-CM | POA: Diagnosis not present

## 2019-07-21 DIAGNOSIS — M0589 Other rheumatoid arthritis with rheumatoid factor of multiple sites: Secondary | ICD-10-CM | POA: Diagnosis not present

## 2019-07-21 DIAGNOSIS — Z0000 Encounter for general adult medical examination without abnormal findings: Secondary | ICD-10-CM | POA: Diagnosis not present

## 2019-07-21 DIAGNOSIS — I2510 Atherosclerotic heart disease of native coronary artery without angina pectoris: Secondary | ICD-10-CM | POA: Diagnosis not present

## 2019-07-21 DIAGNOSIS — E559 Vitamin D deficiency, unspecified: Secondary | ICD-10-CM | POA: Diagnosis not present

## 2019-07-21 DIAGNOSIS — E611 Iron deficiency: Secondary | ICD-10-CM | POA: Diagnosis not present

## 2019-07-21 DIAGNOSIS — D509 Iron deficiency anemia, unspecified: Secondary | ICD-10-CM | POA: Diagnosis not present

## 2019-07-24 ENCOUNTER — Other Ambulatory Visit (HOSPITAL_COMMUNITY): Payer: Self-pay

## 2019-07-25 ENCOUNTER — Ambulatory Visit (HOSPITAL_COMMUNITY)
Admission: RE | Admit: 2019-07-25 | Discharge: 2019-07-25 | Disposition: A | Discharge status: Home / Self Care | Payer: Medicare HMO | Source: Ambulatory Visit | Attending: Internal Medicine | Admitting: Internal Medicine

## 2019-07-25 ENCOUNTER — Other Ambulatory Visit: Payer: Self-pay

## 2019-07-25 DIAGNOSIS — D508 Other iron deficiency anemias: Secondary | ICD-10-CM | POA: Diagnosis not present

## 2019-07-25 LAB — PREPARE RBC (CROSSMATCH)

## 2019-07-25 LAB — ABO/RH: ABO/RH(D): A POS

## 2019-07-25 MED ORDER — SODIUM CHLORIDE 0.9% IV SOLUTION: Freq: Once | INTRAVENOUS | Status: DC

## 2019-07-25 MED ORDER — ACETAMINOPHEN 325 MG PO TABS
650.0000 mg | ORAL_TABLET | Freq: Once | ORAL | Status: AC
  Administered 2019-07-25: 650 mg via ORAL

## 2019-07-25 MED ORDER — DIPHENHYDRAMINE HCL 25 MG PO CAPS
25.0000 mg | ORAL_CAPSULE | Freq: Once | ORAL | Status: AC
  Administered 2019-07-25: 25 mg via ORAL

## 2019-07-25 MED ORDER — DIPHENHYDRAMINE HCL 25 MG PO CAPS
ORAL_CAPSULE | ORAL | Status: AC
  Filled 2019-07-25: qty 1

## 2019-07-25 MED ORDER — ACETAMINOPHEN 325 MG PO TABS
ORAL_TABLET | ORAL | Status: AC
  Filled 2019-07-25: qty 2

## 2019-07-26 LAB — TYPE AND SCREEN
ABO/RH(D): A POS
Antibody Screen: NEGATIVE
Unit division: 0

## 2019-07-26 LAB — BPAM RBC
Blood Product Expiration Date: 202102092359
ISSUE DATE / TIME: 202102050942
Unit Type and Rh: 600

## 2019-07-28 DIAGNOSIS — K2100 Gastro-esophageal reflux disease with esophagitis, without bleeding: Secondary | ICD-10-CM | POA: Diagnosis not present

## 2019-07-28 DIAGNOSIS — D500 Iron deficiency anemia secondary to blood loss (chronic): Secondary | ICD-10-CM | POA: Diagnosis not present

## 2019-08-13 DIAGNOSIS — E611 Iron deficiency: Secondary | ICD-10-CM | POA: Diagnosis not present

## 2019-08-13 DIAGNOSIS — N390 Urinary tract infection, site not specified: Secondary | ICD-10-CM | POA: Diagnosis not present

## 2019-08-20 DIAGNOSIS — Z1159 Encounter for screening for other viral diseases: Secondary | ICD-10-CM | POA: Diagnosis not present

## 2019-08-21 DIAGNOSIS — D509 Iron deficiency anemia, unspecified: Secondary | ICD-10-CM | POA: Diagnosis not present

## 2019-08-25 DIAGNOSIS — D500 Iron deficiency anemia secondary to blood loss (chronic): Secondary | ICD-10-CM | POA: Diagnosis not present

## 2019-08-28 DIAGNOSIS — D509 Iron deficiency anemia, unspecified: Secondary | ICD-10-CM | POA: Diagnosis not present

## 2019-09-03 ENCOUNTER — Other Ambulatory Visit: Payer: Self-pay | Admitting: Internal Medicine

## 2019-09-03 DIAGNOSIS — D649 Anemia, unspecified: Secondary | ICD-10-CM | POA: Diagnosis not present

## 2019-09-03 DIAGNOSIS — Z801 Family history of malignant neoplasm of trachea, bronchus and lung: Secondary | ICD-10-CM

## 2019-09-03 DIAGNOSIS — R350 Frequency of micturition: Secondary | ICD-10-CM | POA: Diagnosis not present

## 2019-09-03 DIAGNOSIS — M069 Rheumatoid arthritis, unspecified: Secondary | ICD-10-CM | POA: Diagnosis not present

## 2019-09-03 DIAGNOSIS — D509 Iron deficiency anemia, unspecified: Secondary | ICD-10-CM | POA: Diagnosis not present

## 2019-09-03 DIAGNOSIS — R319 Hematuria, unspecified: Secondary | ICD-10-CM | POA: Diagnosis not present

## 2019-09-03 DIAGNOSIS — R634 Abnormal weight loss: Secondary | ICD-10-CM

## 2019-09-03 DIAGNOSIS — R0781 Pleurodynia: Secondary | ICD-10-CM | POA: Diagnosis not present

## 2019-09-03 DIAGNOSIS — J90 Pleural effusion, not elsewhere classified: Secondary | ICD-10-CM | POA: Diagnosis not present

## 2019-09-03 DIAGNOSIS — E611 Iron deficiency: Secondary | ICD-10-CM | POA: Diagnosis not present

## 2019-09-03 DIAGNOSIS — M546 Pain in thoracic spine: Secondary | ICD-10-CM | POA: Diagnosis not present

## 2019-09-03 DIAGNOSIS — Z800 Family history of malignant neoplasm of digestive organs: Secondary | ICD-10-CM

## 2019-09-03 DIAGNOSIS — R9389 Abnormal findings on diagnostic imaging of other specified body structures: Secondary | ICD-10-CM | POA: Diagnosis not present

## 2019-09-03 DIAGNOSIS — R918 Other nonspecific abnormal finding of lung field: Secondary | ICD-10-CM | POA: Diagnosis not present

## 2019-09-03 DIAGNOSIS — N390 Urinary tract infection, site not specified: Secondary | ICD-10-CM | POA: Diagnosis not present

## 2019-09-16 ENCOUNTER — Other Ambulatory Visit: Payer: Medicare HMO

## 2019-09-16 ENCOUNTER — Telehealth: Payer: Self-pay | Admitting: Cardiology

## 2019-09-16 NOTE — Telephone Encounter (Signed)
LMOM RE: F/U Visit 

## 2019-09-18 DIAGNOSIS — K219 Gastro-esophageal reflux disease without esophagitis: Secondary | ICD-10-CM | POA: Diagnosis not present

## 2019-09-18 DIAGNOSIS — E611 Iron deficiency: Secondary | ICD-10-CM | POA: Diagnosis not present

## 2019-09-18 DIAGNOSIS — M0589 Other rheumatoid arthritis with rheumatoid factor of multiple sites: Secondary | ICD-10-CM | POA: Diagnosis not present

## 2019-09-18 DIAGNOSIS — M546 Pain in thoracic spine: Secondary | ICD-10-CM | POA: Diagnosis not present

## 2019-09-18 DIAGNOSIS — R748 Abnormal levels of other serum enzymes: Secondary | ICD-10-CM | POA: Diagnosis not present

## 2019-09-18 DIAGNOSIS — M545 Low back pain: Secondary | ICD-10-CM | POA: Diagnosis not present

## 2019-09-18 DIAGNOSIS — D509 Iron deficiency anemia, unspecified: Secondary | ICD-10-CM | POA: Diagnosis not present

## 2019-09-18 DIAGNOSIS — R9389 Abnormal findings on diagnostic imaging of other specified body structures: Secondary | ICD-10-CM | POA: Diagnosis not present

## 2019-09-18 DIAGNOSIS — Z86010 Personal history of colonic polyps: Secondary | ICD-10-CM | POA: Diagnosis not present

## 2019-09-29 DIAGNOSIS — J309 Allergic rhinitis, unspecified: Secondary | ICD-10-CM | POA: Diagnosis not present

## 2019-09-29 DIAGNOSIS — E611 Iron deficiency: Secondary | ICD-10-CM | POA: Diagnosis not present

## 2019-09-29 DIAGNOSIS — R634 Abnormal weight loss: Secondary | ICD-10-CM | POA: Diagnosis not present

## 2019-09-29 DIAGNOSIS — D509 Iron deficiency anemia, unspecified: Secondary | ICD-10-CM | POA: Diagnosis not present

## 2019-09-29 DIAGNOSIS — R9389 Abnormal findings on diagnostic imaging of other specified body structures: Secondary | ICD-10-CM | POA: Diagnosis not present

## 2019-10-03 ENCOUNTER — Ambulatory Visit
Admission: RE | Admit: 2019-10-03 | Discharge: 2019-10-03 | Disposition: A | Discharge status: Home / Self Care | Payer: Medicare HMO | Source: Ambulatory Visit | Attending: Internal Medicine | Admitting: Internal Medicine

## 2019-10-03 ENCOUNTER — Other Ambulatory Visit: Payer: Medicare HMO

## 2019-10-03 DIAGNOSIS — M546 Pain in thoracic spine: Secondary | ICD-10-CM

## 2019-10-03 DIAGNOSIS — R9389 Abnormal findings on diagnostic imaging of other specified body structures: Secondary | ICD-10-CM

## 2019-10-03 DIAGNOSIS — Z800 Family history of malignant neoplasm of digestive organs: Secondary | ICD-10-CM

## 2019-10-03 DIAGNOSIS — Z801 Family history of malignant neoplasm of trachea, bronchus and lung: Secondary | ICD-10-CM

## 2019-10-03 DIAGNOSIS — R634 Abnormal weight loss: Secondary | ICD-10-CM

## 2019-10-03 DIAGNOSIS — D649 Anemia, unspecified: Secondary | ICD-10-CM

## 2019-10-03 DIAGNOSIS — J90 Pleural effusion, not elsewhere classified: Secondary | ICD-10-CM | POA: Diagnosis not present

## 2019-10-03 MED ORDER — IOPAMIDOL (ISOVUE-300) INJECTION 61%
75.0000 mL | Freq: Once | INTRAVENOUS | Status: AC | PRN
  Administered 2019-10-03: 75 mL via INTRAVENOUS

## 2019-10-08 DIAGNOSIS — R197 Diarrhea, unspecified: Secondary | ICD-10-CM | POA: Diagnosis not present

## 2019-10-17 ENCOUNTER — Ambulatory Visit: Payer: Medicare HMO | Admitting: Cardiology

## 2019-10-27 NOTE — Progress Notes (Signed)
Virtual Visit via Telephone Note   This visit type was conducted due to national recommendations for restrictions regarding the COVID-19 Pandemic (e.g. social distancing) in an effort to limit this patient's exposure and mitigate transmission in our community.  Due to her co-morbid illnesses, this patient is at least at moderate risk for complications without adequate follow up.  This format is felt to be most appropriate for this patient at this time.  The patient did not have access to video technology/had technical difficulties with video requiring transitioning to audio format only (telephone).  All issues noted in this document were discussed and addressed.  No physical exam could be performed with this format.  Please refer to the patient's chart for her  consent to telehealth for [facility].   Date:  10/28/2019   ID:  Janice [name], Janice [name] 1945/09/18, MRN [id number]  Patient Location: Home Provider Location: Home  PCP:  [name], MD  Cardiologist:  Buford Dresser, MD  Electrophysiologist:  None   Evaluation Performed:  Follow-Up Visit  Chief Complaint:  Follow Up   History of Present Illness:    Janice [name] is a 74 y.o. female we are following for ongoing assessment and management of coronary artery disease with history of MI in 2010, history of prior PE (occurring after she broke her foot, had a cast and then placed on OCP.)  Other history includes rheumatoid arthritis, anemia of unclear etiology.  Was last seen by Dr. Harrell Gave on 06/02/2019.  At that time she was complaining of shortness of breath when she exerts herself.  She did reviewed the results of her echo and stress test which were both reassuring from a cardiac perspective and not indicative of cardiac disease causing her symptoms.  She was to follow-up with PCP for ongoing management of her dyspnea.  No further testing or medication changes were made.   Seeing pulmonary physician next month in June  2021. Tested for COVID and was found to be negative. Energy improving. Not had to use NTG. Continues to have back pain which is moderately managed by PCP and rheumatologist.  She has no cardiac symptoms.   The patient does not have symptoms concerning for COVID-19 infection (fever, chills, cough, or new shortness of breath).    Past Medical History:  Diagnosis Date  . Acute myocardial infarction, unspecified site, episode of care unspecified   . Arthritis, rheumatoid (HCC)    hx  . Back pain, chronic   . Pulmonary embolism ([place])   . Scoliosis    Past Surgical History:  Procedure Laterality Date  . COLONOSCOPY       Current Meds  Medication Sig  . aspirin EC 81 MG tablet Take 81 mg by mouth daily.  . Biotin 5000 MCG TABS Take 1 tablet by mouth daily.   . celecoxib (CELEBREX) 200 MG capsule TAKE 1 CAPSULE BY MOUTH EVERY DAY AS NEEDED FOR PAIN  . famotidine (PEPCID) 20 MG tablet Take 20 mg by mouth 2 (two) times daily as needed.  . Folic Acid (FOLATE PO) Take 1 tablet by mouth daily.   . Menaquinone-7 (VITAMIN K2 PO) Take by mouth.  . methotrexate (RHEUMATREX) 2.5 MG tablet Take 2.5 mg by mouth once a week. 10 tabs every Tuesday   . Multiple Vitamins-Minerals (MULTIVITAL) tablet Take 1 tablet by mouth daily.    . nitroGLYCERIN (NITROSTAT) 0.4 MG SL tablet PLACE 1 TABLET (0.4 MG TOTAL) UNDER THE TONGUE EVERY 5 (FIVE) MINUTES AS NEEDED FOR  CHEST PAIN.  Marland Kitchen predniSONE (DELTASONE) 5 MG tablet TAKE 1 TABLET BY MOUTH EVERY DAY WITH FOOD OR MILK     Allergies:   Patient has no known allergies.   Social History   Tobacco Use  . Smoking status: Former Research scientist (life sciences)  . Smokeless tobacco: Never Used  . Tobacco comment: has 7 plus pack-year smoking history, but quit in 1981.   Substance Use Topics  . Alcohol use: Not Currently    Comment: rare etoh   . Drug use: No     Family Hx: The patient's family history includes Cancer in her mother.  ROS:   Please see the history of present illness.     All other systems reviewed and are negative.   Prior CV studies:   The following studies were reviewed today: Echo 03/05/19 1. The left ventricle has normal systolic function, with an ejection fraction of 55-60%. The cavity size was normal. Left ventricular diastolic Doppler parameters are consistent with impaired relaxation. 2. The right ventricle has normal systolic function. The cavity was normal. There is no increase in right ventricular wall thickness. 3. The mitral valve is grossly normal. There is mild mitral annular calcification present. 4. The aortic valve is tricuspid. Moderate thickening of the aortic valve. Moderate calcification of the aortic valve. Aortic valve regurgitation is trivial by color flow Doppler. 5. The aorta is normal unless otherwise noted.  Lexiscan 03/05/19  Nuclear stress EF: 47%. visually, the EF appears to be greater than 47% and appears to be normal  There was no ST segment deviation noted during stress.  This is a low risk study. no evidence of ischemia or previous infarction  The study is normal.  Cath 05/17/2009: LM nonobstructive CAD LAD sequential 30% lesions and distal diffuse plaque LCx no obstructive disease RCA no obstructive disease  Cardiac Cath Findings: Left ventricular angiogram was performed in the RAO projection  which shows mild global left ventricular systolic dysfunction with  ejection fraction of 45%. No significant mitral regurgitation is  noted.   IMPRESSION:  1. Mild nonobstructive coronary artery disease.  2. Mild global left ventricular systolic dysfunction.   RECOMMENDATIONS: I recommend medical management at this time. We will  start a statin medication as well as a low dose of an ACE inhibitor.  The patient will be treated with Plavix for 1 month given her acute  coronary syndrome during this presentation. We will check an  echocardiogram today. She will be moved to a  telemetry bed with her  heart rhythm monitored over the next 24 hours.   Echo 05/21/2009  - Left ventricle: The cavity size was normal. Systolic function was   normal. The estimated ejection fraction was in the range of 55% to   60%. Wall motion was normal; there were no regional wall motion   abnormalities. Doppler parameters are consistent with abnormal   left ventricular relaxation (grade 1 diastolic dysfunction).  - Right ventricle: The cavity size was mildly dilated.  - Right atrium: The atrium was mildly dilated.  Impressions:   - Echobright area at RV apex on apical four chamber view most likely   represents prominent trabeculae.   Labs/Other Tests and Data Reviewed:    EKG:  No ECG reviewed.  Recent Labs: No results found for requested labs within last 8760 hours.   Recent Lipid Panel Lab Results  Component Value Date/Time   CHOL  05/17/2009 12:30 PM    151        ATP  III CLASSIFICATION:  <200     mg/dL   Desirable  200-239  mg/dL   Borderline High  >=240    mg/dL   High          TRIG 76 05/17/2009 12:30 PM   HDL 58 05/17/2009 12:30 PM   CHOLHDL 2.6 05/17/2009 12:30 PM   LDLCALC  05/17/2009 12:30 PM    78        Total Cholesterol/HDL:CHD Risk Coronary Heart Disease Risk Table                     Janice [name]   Women  1/2 Average Risk   3.4   3.3  Average Risk       5.0   4.4  2 X Average Risk   9.6   7.1  3 X Average Risk  23.4   11.0        Use the calculated Patient Ratio above and the CHD Risk Table to determine the patient's CHD Risk.        ATP III CLASSIFICATION (LDL):  <100     mg/dL   Optimal  100-129  mg/dL   Near or Above                    Optimal  130-159  mg/dL   Borderline  160-189  mg/dL   High  >190     mg/dL   Very High    Wt Readings from Last 3 Encounters:  10/28/19 197 lb (89.4 kg)  07/25/19 (P) 207 lb (93.9 kg)  06/02/19 217 lb 12.8 oz (98.8 kg)     Objective:    Vital Signs:  Ht 5\' 6"  (1.676 m)   Wt 197 lb (89.4  kg)   BMI 31.80 kg/m  Limited due to telephone visit.   VITAL SIGNS:  reviewed GEN:  no acute distress NEURO:  alert and oriented x 3, no obvious focal deficit PSYCH:  normal affect  ASSESSMENT & PLAN:    1. Chest pain: No recurrent symptoms.  Prior testing in 2020 was negative for ischemia or structural heart disease. No further cardiac testing is required at this time. Will see her PRN.    2.    Dyspnea: Is to be seen by pulmonary next month for work up and recommendations.   3.   Chronic back pain: Followed by PCP. Referrals per clinical indications.   COVID-19 Education: The signs and symptoms of COVID-19 were discussed with the patient and how to seek care for testing (follow up with PCP or arrange E-visit).The importance of social distancing was discussed today. She has not had COVID vaccine. She prefers to wait.   Time:   Today, I have spent 20  minutes with the patient with telehealth technology discussing the above problems.     Medication Adjustments/Labs and Tests Ordered: Current medicines are reviewed at length with the patient today.  Concerns regarding medicines are outlined above.   Tests Ordered: No orders of the defined types were placed in this encounter.   Medication Changes: No orders of the defined types were placed in this encounter.   Disposition:  Follow up PRN   Signed, Phill Myron. [place] Pugh, ANP, AACC  10/28/2019 10:41 AM    [facility]

## 2019-10-28 ENCOUNTER — Encounter: Payer: Self-pay | Admitting: Adult Health

## 2019-10-28 ENCOUNTER — Telehealth (INDEPENDENT_AMBULATORY_CARE_PROVIDER_SITE_OTHER): Payer: Medicare HMO | Admitting: Adult Health

## 2019-10-28 VITALS — Ht 66.0 in | Wt 197.0 lb

## 2019-10-28 DIAGNOSIS — R079 Chest pain, unspecified: Secondary | ICD-10-CM

## 2019-10-28 DIAGNOSIS — G8929 Other chronic pain: Secondary | ICD-10-CM | POA: Diagnosis not present

## 2019-10-28 DIAGNOSIS — M545 Low back pain, unspecified: Secondary | ICD-10-CM

## 2019-10-28 DIAGNOSIS — R0602 Shortness of breath: Secondary | ICD-10-CM | POA: Diagnosis not present

## 2019-10-28 NOTE — Patient Instructions (Signed)
Medication Instructions:  Continue current medications  *If you need a refill on your cardiac medications before your next appointment, please call your pharmacy*   Lab Work: None Ordered  If you have labs (blood work) drawn today and your tests are completely normal, you will receive your results only by: Marland Kitchen MyChart Message (if you have MyChart) OR . A paper copy in the mail If you have any lab test that is abnormal or we need to change your treatment, we will call you to review the results.   Testing/Procedures: None Ordered   Follow-Up: At [facility], you and your health needs are our priority.  As part of our continuing mission to provide you with exceptional heart care, we have created designated Provider Care Teams.  These Care Teams include your primary Cardiologist (physician) and Advanced Practice Providers (APPs -  Physician Assistants and Nurse Practitioners) who all work together to provide you with the care you need, when you need it.  We recommend signing up for the patient portal called "MyChart".  Sign up information is provided on this After Visit Summary.  MyChart is used to connect with patients for Virtual Visits (Telemedicine).  Patients are able to view lab/test results, encounter notes, upcoming appointments, etc.  Non-urgent messages can be sent to your provider as well.   To learn more about what you can do with MyChart, go to [email or web address].    Your next appointment:    As Needed

## 2019-10-30 ENCOUNTER — Institutional Professional Consult (permissible substitution): Payer: Medicare HMO | Admitting: Pulmonary Disease

## 2019-10-31 ENCOUNTER — Institutional Professional Consult (permissible substitution): Payer: Medicare HMO | Admitting: Pulmonary Disease

## 2019-11-24 ENCOUNTER — Ambulatory Visit
Admission: EM | Admit: 2019-11-24 | Discharge: 2019-11-24 | Disposition: A | Discharge status: Home / Self Care | Payer: Medicare HMO | Attending: Physician Assistant | Admitting: Physician Assistant

## 2019-11-24 ENCOUNTER — Other Ambulatory Visit: Payer: Self-pay

## 2019-11-24 ENCOUNTER — Encounter: Payer: Self-pay | Admitting: Emergency Medicine

## 2019-11-24 ENCOUNTER — Ambulatory Visit (INDEPENDENT_AMBULATORY_CARE_PROVIDER_SITE_OTHER): Payer: Medicare HMO

## 2019-11-24 DIAGNOSIS — Z87891 Personal history of nicotine dependence: Secondary | ICD-10-CM | POA: Diagnosis not present

## 2019-11-24 DIAGNOSIS — J209 Acute bronchitis, unspecified: Secondary | ICD-10-CM

## 2019-11-24 DIAGNOSIS — R05 Cough: Secondary | ICD-10-CM | POA: Diagnosis not present

## 2019-11-24 DIAGNOSIS — Z86711 Personal history of pulmonary embolism: Secondary | ICD-10-CM

## 2019-11-24 DIAGNOSIS — Z7689 Persons encountering health services in other specified circumstances: Secondary | ICD-10-CM | POA: Diagnosis not present

## 2019-11-24 MED ORDER — PREDNISONE 50 MG PO TABS: 50.0000 mg | ORAL_TABLET | Freq: Every day | ORAL | 0 refills | Status: DC

## 2019-11-24 MED ORDER — AEROCHAMBER PLUS FLO-VU MEDIUM MISC
1.0000 | Freq: Once | Status: AC
  Administered 2019-11-24: 1

## 2019-11-24 MED ORDER — DOXYCYCLINE HYCLATE 100 MG PO CAPS: 100.0000 mg | ORAL_CAPSULE | Freq: Two times a day (BID) | ORAL | 0 refills | Status: DC

## 2019-11-24 MED ORDER — ALBUTEROL SULFATE HFA 108 (90 BASE) MCG/ACT IN AERS
2.0000 | INHALATION_SPRAY | Freq: Once | RESPIRATORY_TRACT | Status: AC
  Administered 2019-11-24: 2 via RESPIRATORY_TRACT

## 2019-11-24 NOTE — ED Notes (Signed)
covid sample obtained, labeled and ready for lab

## 2019-11-24 NOTE — ED Provider Notes (Signed)
[name]    CSN: [id number] Arrival date & time: 11/24/19  1452      History   Chief Complaint Chief Complaint  Patient presents with   Cough    HPI Janice [name] is a 74 y.o. female.   74 year old female with history of CAD, PE, RA comes in for 2-3 week history of cough. Denies rhinorrhea, nasal congestion, sore throat. Cold chills without known temperature. Denies abdominal pain, nausea, vomiting, diarrhea. Loss of smell that is chronic. Denies loss of taste. Had shortness of breath a few months ago, improved after treatment for possible pneumonia with mild residual symptoms. Has establishment appt with pulmonology due to this. Denies worsening of shob. Former smoker. Denies history of bronchitis, COPD, inhaler use.      Past Medical History:  Diagnosis Date   Acute myocardial infarction, unspecified site, episode of care unspecified    Arthritis, rheumatoid (HCC)    hx   Back pain, chronic    Pulmonary embolism ([place])    Scoliosis     Patient Active Problem List   Diagnosis Date Noted   Coronary artery disease involving native heart with angina pectoris ([place]) 02/24/2019   Heart palpitations 02/24/2019   History of MI (myocardial infarction) 02/24/2019   SOB (shortness of breath) 02/24/2019   Lower extremity edema 02/24/2019   MYOCARDIAL INFARCTION, SUBENDOCARDIAL, SUBSEQUENT CARE 06/02/2009   BACK PAIN, CHRONIC 06/01/2009   SCOLIOSIS 06/01/2009   ARTHRITIS, RHEUMATOID, HX OF 06/01/2009    Past Surgical History:  Procedure Laterality Date   bladder tack     COLONOSCOPY     HERNIA REPAIR     TONSILLECTOMY      OB History   No obstetric history on file.      Home Medications    Prior to Admission medications   Medication Sig Start Date End Date Taking? Authorizing Provider  aspirin EC 81 MG tablet Take 81 mg by mouth daily.   Yes [provider]  Biotin 5000 MCG TABS Take 1 tablet by mouth daily.    Yes  [provider]  famotidine (PEPCID) 20 MG tablet Take 20 mg by mouth 2 (two) times daily as needed. 01/01/19  Yes [provider]  Folic Acid (FOLATE PO) Take 1 tablet by mouth daily.    Yes [provider]  Menaquinone-7 (VITAMIN K2 PO) Take by mouth.   Yes [provider]  methotrexate (RHEUMATREX) 2.5 MG tablet Take 2.5 mg by mouth once a week. 10 tabs every Tuesday    Yes [provider]  Multiple Vitamins-Minerals (MULTIVITAL) tablet Take 1 tablet by mouth daily.     Yes [provider]  celecoxib (CELEBREX) 200 MG capsule TAKE 1 CAPSULE BY MOUTH EVERY DAY AS NEEDED FOR PAIN 01/15/19   [provider]  doxycycline (VIBRAMYCIN) 100 MG capsule Take 1 capsule (100 mg total) by mouth 2 (two) times daily. 11/24/19   Tasia Catchings, Ivi Griffith V, PA-C  nitroGLYCERIN (NITROSTAT) 0.4 MG SL tablet PLACE 1 TABLET (0.4 MG TOTAL) UNDER THE TONGUE EVERY 5 (FIVE) MINUTES AS NEEDED FOR CHEST PAIN. 06/03/19   Buford Dresser, MD  predniSONE (DELTASONE) 5 MG tablet TAKE 1 TABLET BY MOUTH EVERY DAY WITH FOOD OR MILK 01/16/19   [provider]  predniSONE (DELTASONE) 50 MG tablet Take 1 tablet (50 mg total) by mouth daily with breakfast. 11/24/19   Ok Edwards, PA-C    Family History Family History  Problem Relation Age of Onset  Cancer Mother     Social History Social History   Tobacco Use   Smoking status: Former Smoker   Smokeless tobacco: Never Used   Tobacco comment: has 7 plus pack-year smoking history, but quit in 1981.   Substance Use Topics   Alcohol use: Not Currently    Comment: rare etoh    Drug use: No     Allergies   Erythromycin   Review of Systems Review of Systems  Reason unable to perform ROS: See HPI as above.     Physical Exam Triage Vital Signs ED Triage Vitals  Enc Vitals Group     BP 11/24/19 1504 132/84     Pulse Rate 11/24/19 1504 (!) 108     Resp 11/24/19 1504 20     Temp 11/24/19 1504 98.4 F  (36.9 C)     Temp Source 11/24/19 1504 Oral     SpO2 11/24/19 1504 94 %     Weight --      Height --      Head Circumference --      Peak Flow --      Pain Score 11/24/19 1459 0     Pain Loc --      Pain Edu? --      Excl. in [place]? --    No data found.  Updated Vital Signs BP 132/84 (BP Location: Left Arm) Comment (BP Location): large cuff   Pulse (!) 108    Temp 98.4 F (36.9 C) (Oral)    Resp 20    SpO2 94%   Physical Exam Constitutional:      General: She is not in acute distress.    Appearance: Normal appearance. She is not ill-appearing, toxic-appearing or diaphoretic.  HENT:     Head: Normocephalic and atraumatic.     Mouth/Throat:     Mouth: Mucous membranes are moist.     Pharynx: Oropharynx is clear. Uvula midline.  Cardiovascular:     Rate and Rhythm: Normal rate and regular rhythm.     Heart sounds: Normal heart sounds. No murmur. No friction rub. No gallop.   Pulmonary:     Effort: Pulmonary effort is normal. No accessory muscle usage, prolonged expiration, respiratory distress or retractions.     Comments: Diffuse rhonchi with end expiration wheezing.  Albuterol 2 puffs x 1: improved air movement with resolution of wheezing. Continued diffuse rhonchi Musculoskeletal:     Cervical back: Normal range of motion and neck supple.  Neurological:     General: No focal deficit present.     Mental Status: She is alert and oriented to person, place, and time.     UC Treatments / Results  Labs (all labs ordered are listed, but only abnormal results are displayed) Labs Reviewed  NOVEL CORONAVIRUS, NAA    EKG   Radiology DG Chest 2 View  Result Date: 11/24/2019 CLINICAL DATA:  Productive cough for 3 weeks. History of pulmonary embolism. Ex-smoker. EXAM: CHEST - 2 VIEW COMPARISON:  10/03/2019 CT. Most recent chest radiograph 05/16/2009. FINDINGS: Patient rotated minimally right. Midline trachea. Normal heart size. Moderate hiatal hernia. No pleural effusion or  pneumothorax. Relatively linear left lower lobe opacity which when correlated with the scout film from the 10/03/2019 CT, is most consistent with scarring. No new pulmonary opacity. IMPRESSION: No acute cardiopulmonary disease. Hiatal hernia. Left lung base scarring. Electronically Signed   By: Abigail Miyamoto M.D.   On: 11/24/2019 15:49    Procedures Procedures (including critical care  time)  Medications Ordered in UC Medications  albuterol (VENTOLIN HFA) 108 (90 Base) MCG/ACT inhaler 2 puff (2 puffs Inhalation Given 11/24/19 1531)  AeroChamber Plus Flo-Vu Medium MISC 1 each (1 each Other Given 11/24/19 1531)    Initial Impression / Assessment and Plan / UC Course  I have reviewed the triage vital signs and the nursing notes.  Pertinent labs & imaging results that were available during my care of the patient were reviewed by me and considered in my medical decision making (see chart for details).    CXR negative for active cardiopulmonary disease. COVID testing pending. Will treat for bronchitis and possible atypical pneumonia with doxycycline, prednisone. Albuterol as needed. Return precautions given.   Patient to call office if cough not controlled by current medications.   Final Clinical Impressions(s) / UC Diagnoses   Final diagnoses:  Acute bronchitis, unspecified organism   ED Prescriptions    Medication Sig Dispense Auth. Provider   predniSONE (DELTASONE) 50 MG tablet Take 1 tablet (50 mg total) by mouth daily with breakfast. 5 tablet Caedin Mogan V, PA-C   doxycycline (VIBRAMYCIN) 100 MG capsule Take 1 capsule (100 mg total) by mouth 2 (two) times daily. 14 capsule Ok Edwards, PA-C     PDMP not reviewed this encounter.   Ok Edwards, PA-C 11/24/19 1606

## 2019-11-24 NOTE — ED Triage Notes (Addendum)
Patient reports a cough since 5/18.  Patient reports her grandchildren have the same.  Patient has had cold chills, night sweats.  Patient has a productive cough, reports yellow phlegm.    Patient did not obtain covid vaccine

## 2019-11-24 NOTE — Discharge Instructions (Signed)
COVID testing pending. Chest xray shows scarring that was present from CT scan 2 months ago. No signs of pneumonia, fluid built up. Start doxycycline and prednisone as directed. Albuterol as needed. 2 puffs before bedtime. Follow up with pulmonologist as scheduled for reevaluation.

## 2019-11-25 LAB — SARS-COV-2, NAA 2 DAY TAT

## 2019-11-25 LAB — NOVEL CORONAVIRUS, NAA: SARS-CoV-2, NAA: NOT DETECTED

## 2019-11-27 ENCOUNTER — Telehealth: Payer: Self-pay | Admitting: Physician Assistant

## 2019-11-27 MED ORDER — HYDROCODONE-HOMATROPINE 5-1.5 MG/5ML PO SYRP: 5.0000 mL | ORAL_SOLUTION | Freq: Three times a day (TID) | ORAL | 0 refills | Status: DC | PRN

## 2019-11-27 NOTE — Telephone Encounter (Signed)
Patient called back stating still having significant cough. Will add on hycodan as needed. Continue abx and prednisone. Return precautions given by RN

## 2019-12-15 ENCOUNTER — Other Ambulatory Visit: Payer: Self-pay

## 2019-12-15 ENCOUNTER — Ambulatory Visit: Payer: Medicare HMO | Admitting: Pulmonary Disease

## 2019-12-15 ENCOUNTER — Encounter: Payer: Self-pay | Admitting: Pulmonary Disease

## 2019-12-15 VITALS — BP 144/80 | HR 94 | Temp 98.5°F | Ht 66.0 in | Wt 185.4 lb

## 2019-12-15 DIAGNOSIS — R0602 Shortness of breath: Secondary | ICD-10-CM | POA: Diagnosis not present

## 2019-12-15 LAB — CBC WITH DIFFERENTIAL/PLATELET
Basophils Absolute: 0.1 10*3/uL (ref 0.0–0.1)
Basophils Relative: 0.8 % (ref 0.0–3.0)
Eosinophils Absolute: 0.2 10*3/uL (ref 0.0–0.7)
Eosinophils Relative: 3.3 % (ref 0.0–5.0)
HCT: 40.4 % (ref 36.0–46.0)
Hemoglobin: 13.2 g/dL (ref 12.0–15.0)
Lymphocytes Relative: 15.6 % (ref 12.0–46.0)
Lymphs Abs: 1.1 10*3/uL (ref 0.7–4.0)
MCHC: 32.6 g/dL (ref 30.0–36.0)
MCV: 93 fl (ref 78.0–100.0)
Monocytes Absolute: 0.8 10*3/uL (ref 0.1–1.0)
Monocytes Relative: 12.1 % — ABNORMAL HIGH (ref 3.0–12.0)
Neutro Abs: 4.7 10*3/uL (ref 1.4–7.7)
Neutrophils Relative %: 68.2 % (ref 43.0–77.0)
Platelets: 292 10*3/uL (ref 150.0–400.0)
RBC: 4.35 Mil/uL (ref 3.87–5.11)
RDW: 16.6 % — ABNORMAL HIGH (ref 11.5–15.5)
WBC: 6.9 10*3/uL (ref 4.0–10.5)

## 2019-12-15 MED ORDER — BUDESONIDE-FORMOTEROL FUMARATE 160-4.5 MCG/ACT IN AERO: 2.0000 | INHALATION_SPRAY | Freq: Two times a day (BID) | RESPIRATORY_TRACT | 5 refills | Status: DC

## 2019-12-15 NOTE — Progress Notes (Signed)
Janice [name]    [id number]    18-Sep-1945  Primary Care Physician:Kim, Jeneen Rinks, MD  Referring Physician: [name], MD [address] [place],  [place] [address]  Chief complaint:   Consult for abnormal chest imaging  HPI: 74 year old ex-smoker with history of GERD, rheumatoid arthritis on methotrexate Complains of recurrent bronchitis over the past few months.  Recently seen in ED and treated with doxycycline and prednisone She has chest imaging which shows left lower lobe bronchiectasis and minimal pleural effusion and has been referred for further evaluation next Chief complaint is cough with yellow/white mucus, wheezing, dyspnea.  Denies any fevers, chills  History notable for rheumatoid arthritis.  She follows with Dr. Dossie Der and has been on methotrexate since 1995.  Pets: Dogs.  No birds, farm animals Occupation: Used to work in a [employer] Exposures: No known exposures.  No mold, hot tub, Jacuzzi.  No down pillows or comforter Smoking history: 8-pack-year smoker.  Quit in 1981 Travel history: No significant travel history Relevant family history: Mother died of lung cancer.  She was a smoker.   Outpatient Encounter Medications as of 12/15/2019  Medication Sig   aspirin EC 81 MG tablet Take 81 mg by mouth daily.   Biotin 5000 MCG TABS Take 1 tablet by mouth daily.    celecoxib (CELEBREX) 200 MG capsule TAKE 1 CAPSULE BY MOUTH EVERY DAY AS NEEDED FOR PAIN   Cholecalciferol (D3 PO) Take 3,000 mcg by mouth.   famotidine (PEPCID) 20 MG tablet Take 20 mg by mouth 2 (two) times daily as needed.   Folic Acid (FOLATE PO) Take 1 tablet by mouth daily.    Menaquinone-7 (VITAMIN K2 PO) Take by mouth.   methotrexate (RHEUMATREX) 2.5 MG tablet Take 2.5 mg by mouth once a week. 10 tabs every Tuesday    Multiple Vitamins-Minerals (MULTIVITAL) tablet Take 1 tablet by mouth daily.     nitroGLYCERIN (NITROSTAT) 0.4 MG SL tablet PLACE 1  TABLET (0.4 MG TOTAL) UNDER THE TONGUE EVERY 5 (FIVE) MINUTES AS NEEDED FOR CHEST PAIN.   predniSONE (DELTASONE) 5 MG tablet TAKE 1 TABLET BY MOUTH EVERY DAY WITH FOOD OR MILK   vitamin B-12 (CYANOCOBALAMIN) 1000 MCG tablet Take 1,000 mcg by mouth daily.   Zinc Sulfate (ZINC 15 PO) Take by mouth in the morning, at noon, and at bedtime.   HYDROcodone-homatropine (HYCODAN) 5-1.5 MG/5ML syrup Take 5 mLs by mouth every 8 (eight) hours as needed for cough. (Patient not taking: Reported on 12/15/2019)   [DISCONTINUED] doxycycline (VIBRAMYCIN) 100 MG capsule Take 1 capsule (100 mg total) by mouth 2 (two) times daily.   [DISCONTINUED] predniSONE (DELTASONE) 50 MG tablet Take 1 tablet (50 mg total) by mouth daily with breakfast.   No facility-administered encounter medications on file as of 12/15/2019.    Allergies as of 12/15/2019 - Review Complete 12/15/2019  Allergen Reaction Noted   Erythromycin  11/24/2019    Past Medical History:  Diagnosis Date   Acute myocardial infarction, unspecified site, episode of care unspecified    Arthritis, rheumatoid (HCC)    hx   Back pain, chronic    Pulmonary embolism (HCC)    Scoliosis     Past Surgical History:  Procedure Laterality Date   bladder tack     COLONOSCOPY     HERNIA REPAIR     TONSILLECTOMY      Family History  Problem Relation Age of Onset   Cancer  Mother     Social History   Socioeconomic History   Marital status: Married    Spouse name: Not on file   Number of children: Not on file   Years of education: Not on file   Highest education level: Not on file  Occupational History   Not on file  Tobacco Use   Smoking status: Former Smoker   Smokeless tobacco: Never Used   Tobacco comment: has 7 plus pack-year smoking history, but quit in 1981.   Substance and Sexual Activity   Alcohol use: Not Currently    Comment: rare etoh    Drug use: No   Sexual activity: Not on file  Other Topics Concern     Not on file  Social History Narrative   Lives in [place] with her daughter. She works full time in a [employer], doing nonexertional work. No herbal medications, regular diet. No regular exercise.    Social Determinants of Health   Financial Resource Strain:    Difficulty of Paying Living Expenses:   Food Insecurity:    Worried About Charity fundraiser in the Last Year:    Arboriculturist in the Last Year:   Transportation Needs:    Film/video editor (Medical):    Lack of Transportation (Non-Medical):   Physical Activity:    Days of Exercise per Week:    Minutes of Exercise per Session:   Stress:    Feeling of Stress :   Social Connections:    Frequency of Communication with Friends and Family:    Frequency of Social Gatherings with Friends and Family:    Attends Religious Services:    Active Member of Clubs or Organizations:    Attends Music therapist:    Marital Status:   Intimate Partner Violence:    Fear of Current or Ex-Partner:    Emotionally Abused:    Physically Abused:    Sexually Abused:     Review of systems: Review of Systems  Constitutional: Negative for fever and chills.  HENT: Negative.   Eyes: Negative for blurred vision.  Respiratory: as per HPI  Cardiovascular: Negative for chest pain and palpitations.  Gastrointestinal: Negative for vomiting, diarrhea, blood per rectum. Genitourinary: Negative for dysuria, urgency, frequency and hematuria.  Musculoskeletal: Negative for myalgias, back pain and joint pain.  Skin: Negative for itching and rash.  Neurological: Negative for dizziness, tremors, focal weakness, seizures and loss of consciousness.  Endo/Heme/Allergies: Negative for environmental allergies.  Psychiatric/Behavioral: Negative for depression, suicidal ideas and hallucinations.  All other systems reviewed and are negative.  Physical Exam: Blood pressure (!) 144/80, pulse 94, temperature  98.5 F (36.9 C), temperature source Oral, height 5\' 6"  (1.676 m), weight 185 lb 6.4 oz (84.1 kg), SpO2 93 %. Gen:      No acute distress HEENT:  EOMI, sclera anicteric Neck:     No masses; no thyromegaly Lungs:   Wheezing, scattered crackles CV:         Regular rate and rhythm; no murmurs Abd:      + bowel sounds; soft, non-tender; no palpable masses, no distension Ext:    No edema; adequate peripheral perfusion Skin:      Warm and dry; no rash Neuro: alert and oriented x 3 Psych: normal mood and affect  Data Reviewed: Imaging: CT chest 10/03/2019-small pleural effusion, minimal linear scarring in the left lower lobe and posterior right base.  Mild bronchiectasis in the left lower lobe mucous  plugging  Chest x-ray 11/24/2019-left lung base scarring. I have reviewed the images personally.  PFTs:  Labs:  Assessment:  Bronchiectasis  CT scan reviewed with mild bronchiectasis, mucous plugging.  This could be secondary to rheumatoid arthritis.  No significant evidence of interstitial lung disease though there is nonspecific scarring at the bases.  With her wheezing on examination and smoking history she could have COPD/asthma as well Check labs today including CBC differential, IgE, alpha-1 antitrypsin levels and phenotype Start Symbicort Give flutter valve for mucociliary clearance Schedule pulmonary function test  Plan/Recommendations: CBC, IgE, alpha-1 antitrypsin Symbicort, flutter valve PFTs  Marshell Garfinkel MD [facility] 12/15/2019, 11:03 AM  CC: [name], MD

## 2019-12-15 NOTE — Patient Instructions (Signed)
We will get some labs today including CBC differential, IgE, alpha-1 antitrypsin levels and phenotype We will start you on Symbicort 160 inhaler twice a day Schedule pulmonary function test Start flutter valve for mucociliary clearance  Follow-up in 1 to 2 months.

## 2019-12-16 ENCOUNTER — Encounter: Payer: Self-pay | Admitting: Pulmonary Disease

## 2019-12-24 LAB — ALPHA-1 ANTITRYPSIN PHENOTYPE: A-1 Antitrypsin, Ser: 225 mg/dL — ABNORMAL HIGH (ref 83–199)

## 2019-12-24 LAB — IGE: IgE (Immunoglobulin E), Serum: 401 kU/L — ABNORMAL HIGH (ref <=114)

## 2020-01-01 DIAGNOSIS — I2510 Atherosclerotic heart disease of native coronary artery without angina pectoris: Secondary | ICD-10-CM | POA: Diagnosis not present

## 2020-01-01 DIAGNOSIS — M5136 Other intervertebral disc degeneration, lumbar region: Secondary | ICD-10-CM | POA: Diagnosis not present

## 2020-01-01 DIAGNOSIS — M25571 Pain in right ankle and joints of right foot: Secondary | ICD-10-CM | POA: Diagnosis not present

## 2020-01-01 DIAGNOSIS — M19041 Primary osteoarthritis, right hand: Secondary | ICD-10-CM | POA: Diagnosis not present

## 2020-01-01 DIAGNOSIS — M19042 Primary osteoarthritis, left hand: Secondary | ICD-10-CM | POA: Diagnosis not present

## 2020-01-01 DIAGNOSIS — M810 Age-related osteoporosis without current pathological fracture: Secondary | ICD-10-CM | POA: Diagnosis not present

## 2020-01-01 DIAGNOSIS — M150 Primary generalized (osteo)arthritis: Secondary | ICD-10-CM | POA: Diagnosis not present

## 2020-01-01 DIAGNOSIS — M79641 Pain in right hand: Secondary | ICD-10-CM | POA: Diagnosis not present

## 2020-01-01 DIAGNOSIS — Z79899 Other long term (current) drug therapy: Secondary | ICD-10-CM | POA: Diagnosis not present

## 2020-01-01 DIAGNOSIS — M545 Low back pain: Secondary | ICD-10-CM | POA: Diagnosis not present

## 2020-01-01 DIAGNOSIS — Z86711 Personal history of pulmonary embolism: Secondary | ICD-10-CM | POA: Diagnosis not present

## 2020-01-01 DIAGNOSIS — M79642 Pain in left hand: Secondary | ICD-10-CM | POA: Diagnosis not present

## 2020-01-01 DIAGNOSIS — M0589 Other rheumatoid arthritis with rheumatoid factor of multiple sites: Secondary | ICD-10-CM | POA: Diagnosis not present

## 2020-01-01 DIAGNOSIS — M0630 Rheumatoid nodule, unspecified site: Secondary | ICD-10-CM | POA: Diagnosis not present

## 2020-01-09 ENCOUNTER — Telehealth: Payer: Self-pay | Admitting: Cardiology

## 2020-01-09 NOTE — Telephone Encounter (Signed)
Patient said she got a call from the [place]. [facility] is trying to get approval for Lipitor for the patient as a result of some blood work. The patient has never had blood work done by Dr. Harrell Gave. The patient does not even use this pharmacy.  The patient feels the pharmacy has the wrong person. Please advise

## 2020-01-09 NOTE — Telephone Encounter (Signed)
Returned call to patient-advised this was not prescribed by Dr. Harrell Gave (no mention in chart) or sent to this pharmacy (not listed as her pharmacy).  Unsure what they are referring to.   Per last note-PCP was managing lipids.  No bloodwork completed by Dr. Harrell Gave.      Patient aware

## 2020-01-09 NOTE — Telephone Encounter (Signed)
Spoke to pharmacist from [employer] states she is currently working on Conservation officer, nature for patients with cardiovascular disease.  She had sent a staff message to [facility] recommending patient be on a statin but would need updated labs as well.   Pharmacist spoke to the daughter yesterday.     Advised per chart review MD wanted to start statin but patient wanted to discuss with PCP.   Last OV 10/2019-patient f/u PRN.  Recommend to discuss with PCP as we will only see her PRN now and patient had previously requested this as well.   pharmD will reach out to PCP

## 2020-02-13 ENCOUNTER — Other Ambulatory Visit (HOSPITAL_COMMUNITY): Payer: Medicare HMO

## 2020-02-16 ENCOUNTER — Ambulatory Visit: Payer: Medicare HMO | Admitting: Pulmonary Disease

## 2020-04-19 DIAGNOSIS — Z86711 Personal history of pulmonary embolism: Secondary | ICD-10-CM | POA: Diagnosis not present

## 2020-04-19 DIAGNOSIS — Z79899 Other long term (current) drug therapy: Secondary | ICD-10-CM | POA: Diagnosis not present

## 2020-04-19 DIAGNOSIS — I2510 Atherosclerotic heart disease of native coronary artery without angina pectoris: Secondary | ICD-10-CM | POA: Diagnosis not present

## 2020-04-19 DIAGNOSIS — M25571 Pain in right ankle and joints of right foot: Secondary | ICD-10-CM | POA: Diagnosis not present

## 2020-04-19 DIAGNOSIS — M810 Age-related osteoporosis without current pathological fracture: Secondary | ICD-10-CM | POA: Diagnosis not present

## 2020-04-19 DIAGNOSIS — M150 Primary generalized (osteo)arthritis: Secondary | ICD-10-CM | POA: Diagnosis not present

## 2020-04-19 DIAGNOSIS — M0630 Rheumatoid nodule, unspecified site: Secondary | ICD-10-CM | POA: Diagnosis not present

## 2020-04-19 DIAGNOSIS — M5136 Other intervertebral disc degeneration, lumbar region: Secondary | ICD-10-CM | POA: Diagnosis not present

## 2020-04-19 DIAGNOSIS — M5459 Other low back pain: Secondary | ICD-10-CM | POA: Diagnosis not present

## 2020-04-19 DIAGNOSIS — M0589 Other rheumatoid arthritis with rheumatoid factor of multiple sites: Secondary | ICD-10-CM | POA: Diagnosis not present

## 2020-06-17 ENCOUNTER — Other Ambulatory Visit: Payer: Self-pay | Admitting: Pulmonary Disease

## 2020-06-20 ENCOUNTER — Other Ambulatory Visit: Payer: Self-pay | Admitting: Cardiology

## 2020-09-30 DIAGNOSIS — Z79899 Other long term (current) drug therapy: Secondary | ICD-10-CM | POA: Diagnosis not present

## 2020-09-30 DIAGNOSIS — Z86711 Personal history of pulmonary embolism: Secondary | ICD-10-CM | POA: Diagnosis not present

## 2020-09-30 DIAGNOSIS — M0630 Rheumatoid nodule, unspecified site: Secondary | ICD-10-CM | POA: Diagnosis not present

## 2020-09-30 DIAGNOSIS — M5136 Other intervertebral disc degeneration, lumbar region: Secondary | ICD-10-CM | POA: Diagnosis not present

## 2020-09-30 DIAGNOSIS — M5459 Other low back pain: Secondary | ICD-10-CM | POA: Diagnosis not present

## 2020-09-30 DIAGNOSIS — M150 Primary generalized (osteo)arthritis: Secondary | ICD-10-CM | POA: Diagnosis not present

## 2020-09-30 DIAGNOSIS — I2510 Atherosclerotic heart disease of native coronary artery without angina pectoris: Secondary | ICD-10-CM | POA: Diagnosis not present

## 2020-09-30 DIAGNOSIS — M810 Age-related osteoporosis without current pathological fracture: Secondary | ICD-10-CM | POA: Diagnosis not present

## 2020-09-30 DIAGNOSIS — M25571 Pain in right ankle and joints of right foot: Secondary | ICD-10-CM | POA: Diagnosis not present

## 2020-09-30 DIAGNOSIS — M0589 Other rheumatoid arthritis with rheumatoid factor of multiple sites: Secondary | ICD-10-CM | POA: Diagnosis not present

## 2020-10-27 DIAGNOSIS — J309 Allergic rhinitis, unspecified: Secondary | ICD-10-CM | POA: Insufficient documentation

## 2020-10-27 DIAGNOSIS — M1991 Primary osteoarthritis, unspecified site: Secondary | ICD-10-CM | POA: Insufficient documentation

## 2020-10-27 DIAGNOSIS — N3946 Mixed incontinence: Secondary | ICD-10-CM | POA: Diagnosis not present

## 2020-10-27 DIAGNOSIS — N812 Incomplete uterovaginal prolapse: Secondary | ICD-10-CM | POA: Diagnosis not present

## 2020-10-27 DIAGNOSIS — E7800 Pure hypercholesterolemia, unspecified: Secondary | ICD-10-CM | POA: Insufficient documentation

## 2020-10-27 DIAGNOSIS — N816 Rectocele: Secondary | ICD-10-CM | POA: Diagnosis not present

## 2020-10-27 DIAGNOSIS — N898 Other specified noninflammatory disorders of vagina: Secondary | ICD-10-CM | POA: Diagnosis not present

## 2020-10-27 DIAGNOSIS — M159 Polyosteoarthritis, unspecified: Secondary | ICD-10-CM | POA: Insufficient documentation

## 2020-10-27 DIAGNOSIS — M5136 Other intervertebral disc degeneration, lumbar region: Secondary | ICD-10-CM | POA: Insufficient documentation

## 2020-10-27 DIAGNOSIS — N3281 Overactive bladder: Secondary | ICD-10-CM | POA: Diagnosis not present

## 2020-10-27 DIAGNOSIS — Z860100 Personal history of colon polyps, unspecified: Secondary | ICD-10-CM | POA: Insufficient documentation

## 2020-10-27 DIAGNOSIS — M069 Rheumatoid arthritis, unspecified: Secondary | ICD-10-CM | POA: Insufficient documentation

## 2020-10-27 DIAGNOSIS — N8110 Cystocele, unspecified: Secondary | ICD-10-CM | POA: Diagnosis not present

## 2020-10-27 DIAGNOSIS — R9389 Abnormal findings on diagnostic imaging of other specified body structures: Secondary | ICD-10-CM | POA: Insufficient documentation

## 2020-10-27 DIAGNOSIS — K449 Diaphragmatic hernia without obstruction or gangrene: Secondary | ICD-10-CM | POA: Insufficient documentation

## 2020-10-27 DIAGNOSIS — D509 Iron deficiency anemia, unspecified: Secondary | ICD-10-CM | POA: Insufficient documentation

## 2020-10-27 DIAGNOSIS — K219 Gastro-esophageal reflux disease without esophagitis: Secondary | ICD-10-CM | POA: Insufficient documentation

## 2020-10-27 DIAGNOSIS — N390 Urinary tract infection, site not specified: Secondary | ICD-10-CM | POA: Insufficient documentation

## 2020-10-27 DIAGNOSIS — E559 Vitamin D deficiency, unspecified: Secondary | ICD-10-CM | POA: Insufficient documentation

## 2020-10-27 DIAGNOSIS — I10 Essential (primary) hypertension: Secondary | ICD-10-CM | POA: Insufficient documentation

## 2020-11-09 ENCOUNTER — Other Ambulatory Visit: Payer: Self-pay

## 2020-11-09 ENCOUNTER — Ambulatory Visit
Admission: EM | Admit: 2020-11-09 | Discharge: 2020-11-09 | Disposition: A | Discharge status: Home / Self Care | Payer: Medicare HMO | Attending: Emergency Medicine | Admitting: Emergency Medicine

## 2020-11-09 DIAGNOSIS — R350 Frequency of micturition: Secondary | ICD-10-CM | POA: Insufficient documentation

## 2020-11-09 LAB — POCT URINALYSIS DIP (MANUAL ENTRY)
Bilirubin, UA: NEGATIVE
Blood, UA: NEGATIVE
Glucose, UA: NEGATIVE mg/dL
Ketones, POC UA: NEGATIVE mg/dL
Nitrite, UA: NEGATIVE
Protein Ur, POC: NEGATIVE mg/dL
Spec Grav, UA: 1.025 (ref 1.010–1.025)
Urobilinogen, UA: 0.2 E.U./dL
pH, UA: 6 (ref 5.0–8.0)

## 2020-11-09 MED ORDER — CEPHALEXIN 500 MG PO CAPS: 500.0000 mg | ORAL_CAPSULE | Freq: Two times a day (BID) | ORAL | 0 refills | Status: AC

## 2020-11-09 NOTE — ED Triage Notes (Signed)
One week h/o urgency, frequency and chills. Notes at the onset she had pain during urination which has now resolved. Denies hematuria and constipation. Notes some diarrhea episodes.

## 2020-11-09 NOTE — Discharge Instructions (Signed)
Urine culture pending  Keflex twice daily Follow up with OBGYN if culture negative and still having symptoms

## 2020-11-09 NOTE — ED Provider Notes (Signed)
[name]    CSN: [id number] Arrival date & time: 11/09/20  1733      History   Chief Complaint Chief Complaint  Patient presents with  . Urinary Frequency    HPI Janice [name] is a 75 y.o. female history of prior MI, PE, uterine prolapse, presenting today for evaluation of possible UTI.  Reports over the past week has had urinary frequency urgency and associated chills.  Reports occasional dysuria.  Denies hematuria.  Reports history of UTI and symptoms similar.  Denies known fevers.  Has had some slight nausea.  Denies any vaginal symptoms of discharge itching irritation or odor.  Does have plans to follow-up with OB/GYN as she has had increased prolapse recently.  HPI  Past Medical History:  Diagnosis Date  . Acute myocardial infarction, unspecified site, episode of care unspecified   . Arthritis, rheumatoid (HCC)    hx  . Back pain, chronic   . Pulmonary embolism ([place])   . Scoliosis     Patient Active Problem List   Diagnosis Date Noted  . 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 Heart palpitations 02/24/2019  . History of MI (myocardial infarction) 02/24/2019  . SOB (shortness of breath) 02/24/2019  . Lower extremity edema 02/24/2019  . 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 BACK PAIN, CHRONIC 06/01/2009  . SCOLIOSIS 06/01/2009  . ARTHRITIS, RHEUMATOID, HX OF 06/01/2009    Past Surgical History:  Procedure Laterality Date  . bladder tack    . COLONOSCOPY    . HERNIA REPAIR    . TONSILLECTOMY      OB History   No obstetric history on file.      Home Medications    Prior to Admission medications   Medication Sig Start Date End Date Taking? Authorizing Provider  cephALEXin (KEFLEX) 500 MG capsule Take 1 capsule (500 mg total) by mouth 2 (two) times daily for 5 days. 11/09/20 11/14/20 Yes Alecxander Mainwaring C, PA-C  aspirin EC 81 MG tablet Take 81 mg by mouth  daily.    [provider]  Biotin 5000 MCG TABS Take 1 tablet by mouth daily.     [provider]  celecoxib (CELEBREX) 200 MG capsule TAKE 1 CAPSULE BY MOUTH EVERY DAY AS NEEDED FOR PAIN 01/15/19   [provider]  Cholecalciferol (D3 PO) Take 3,000 mcg by mouth.    [provider]  famotidine (PEPCID) 20 MG tablet Take 20 mg by mouth 2 (two) times daily as needed. 01/01/19   [provider]  Folic Acid (FOLATE PO) Take 1 tablet by mouth daily.     [provider]  HYDROcodone-homatropine (HYCODAN) 5-1.5 MG/5ML syrup Take 5 mLs by mouth every 8 (eight) hours as needed for cough. Patient not taking: Reported on 12/15/2019 11/27/19   Ok Edwards, PA-C  Menaquinone-7 (VITAMIN K2 PO) Take by mouth.    [provider]  methotrexate (RHEUMATREX) 2.5 MG tablet Take 2.5 mg by mouth once a week. 10 tabs every Tuesday     [provider]  Multiple Vitamins-Minerals (MULTIVITAL) tablet Take 1 tablet by mouth daily.      [provider]  nitroGLYCERIN (NITROSTAT) 0.4 MG SL tablet PLACE 1 TABLET (0.4 MG TOTAL) UNDER THE TONGUE EVERY 5 (FIVE) MINUTES AS NEEDED FOR CHEST PAIN. 06/21/20   Buford Dresser, MD  predniSONE (DELTASONE) 5 MG tablet TAKE 1 TABLET BY MOUTH EVERY DAY WITH FOOD OR MILK 01/16/19  [provider]  SYMBICORT 160-4.5 MCG/ACT inhaler TAKE 2 PUFFS BY MOUTH TWICE A DAY 06/17/20   Mannam, Praveen, MD  vitamin B-12 (CYANOCOBALAMIN) 1000 MCG tablet Take 1,000 mcg by mouth daily.    [provider]  Zinc Sulfate (ZINC 15 PO) Take by mouth in the morning, at noon, and at bedtime.    [provider]    Family History Family History  Problem Relation Age of Onset  . Cancer Mother     Social History Social History   Tobacco Use  . Smoking status: Former Research scientist (life sciences)  . Smokeless tobacco: Never Used  . Tobacco comment: has 7 plus pack-year smoking history, but quit in 1981.   Substance Use  Topics  . Alcohol use: Not Currently    Comment: rare etoh   . Drug use: No     Allergies   Erythromycin   Review of Systems Review of Systems  Constitutional: Negative for fever.  Respiratory: Negative for shortness of breath.   Cardiovascular: Negative for chest pain.  Gastrointestinal: Negative for abdominal pain, diarrhea, nausea and vomiting.  Genitourinary: Positive for dysuria and frequency. Negative for flank pain, genital sores, hematuria, menstrual problem, vaginal bleeding, vaginal discharge and vaginal pain.  Musculoskeletal: Negative for back pain.  Skin: Negative for rash.  Neurological: Negative for dizziness, light-headedness and headaches.     Physical Exam Triage Vital Signs ED Triage Vitals  Enc Vitals Group     BP      Pulse      Resp      Temp      Temp src      SpO2      Weight      Height      Head Circumference      Peak Flow      Pain Score      Pain Loc      Pain Edu?      Excl. in [place]?    No data found.  Updated Vital Signs BP 129/82 (BP Location: Right Arm)   Pulse 83   Temp (!) 97.5 F (36.4 C) (Oral)   Resp 18   SpO2 96%   Visual Acuity Right Eye Distance:   Left Eye Distance:   Bilateral Distance:    Right Eye Near:   Left Eye Near:    Bilateral Near:     Physical Exam Vitals and nursing note reviewed.  Constitutional:      Appearance: She is well-developed.     Comments: No acute distress  HENT:     Head: Normocephalic and atraumatic.     Nose: Nose normal.  Eyes:     Conjunctiva/sclera: Conjunctivae normal.  Cardiovascular:     Rate and Rhythm: Normal rate.  Pulmonary:     Effort: Pulmonary effort is normal. No respiratory distress.  Abdominal:     General: There is no distension.  Musculoskeletal:        General: Normal range of motion.     Cervical back: Neck supple.  Skin:    General: Skin is warm and dry.  Neurological:     Mental Status: She is alert and oriented to person, place, and time.       UC Treatments / Results  Labs (all labs ordered are listed, but only abnormal results are displayed) Labs Reviewed  POCT URINALYSIS DIP (MANUAL ENTRY) - Abnormal; Notable for the following components:      Result Value   Leukocytes, UA Trace (*)  All other components within normal limits  URINE CULTURE    EKG   Radiology No results found.  Procedures Procedures (including critical care time)  Medications Ordered in UC Medications - No data to display  Initial Impression / Assessment and Plan / UC Course  I have reviewed the triage vital signs and the nursing notes.  Pertinent labs & imaging results that were available during my care of the patient were reviewed by me and considered in my medical decision making (see chart for details).     UA overall relatively unremarkable, only trace leuks, urine culture pending given history, so shared decision making with patient did opt to go ahead and proceed with treatment with Keflex to treat based off of clinical symptoms as well as history of UTIs, if culture negative will have patient stop antibiotics and follow-up with OB/GYN if symptoms may be more correlated with prolapse rather than true infection.  Discussed strict return precautions. Patient verbalized understanding and is agreeable with plan.  Final Clinical Impressions(s) / UC Diagnoses   Final diagnoses:  Urinary frequency     Discharge Instructions     Urine culture pending  Keflex twice daily Follow up with OBGYN if culture negative and still having symptoms     ED Prescriptions    Medication Sig Dispense Auth. Provider   cephALEXin (KEFLEX) 500 MG capsule Take 1 capsule (500 mg total) by mouth 2 (two) times daily for 5 days. 10 capsule Norman Bier, [place] C, PA-C     PDMP not reviewed this encounter.   Janith Lima, [place] 11/09/20 1830

## 2020-11-12 ENCOUNTER — Telehealth (HOSPITAL_COMMUNITY): Payer: Self-pay | Admitting: Emergency Medicine

## 2020-11-12 LAB — URINE CULTURE: Culture: >=100000 — AB

## 2020-11-12 MED ORDER — NITROFURANTOIN MONOHYD MACRO 100 MG PO CAPS: 100.0000 mg | ORAL_CAPSULE | Freq: Two times a day (BID) | ORAL | 0 refills | Status: DC

## 2021-01-08 ENCOUNTER — Other Ambulatory Visit: Payer: Self-pay | Admitting: Pulmonary Disease

## 2021-01-12 DIAGNOSIS — Z86711 Personal history of pulmonary embolism: Secondary | ICD-10-CM | POA: Diagnosis not present

## 2021-01-12 DIAGNOSIS — M5136 Other intervertebral disc degeneration, lumbar region: Secondary | ICD-10-CM | POA: Diagnosis not present

## 2021-01-12 DIAGNOSIS — M0589 Other rheumatoid arthritis with rheumatoid factor of multiple sites: Secondary | ICD-10-CM | POA: Diagnosis not present

## 2021-01-12 DIAGNOSIS — M150 Primary generalized (osteo)arthritis: Secondary | ICD-10-CM | POA: Diagnosis not present

## 2021-01-12 DIAGNOSIS — M25571 Pain in right ankle and joints of right foot: Secondary | ICD-10-CM | POA: Diagnosis not present

## 2021-01-12 DIAGNOSIS — M5459 Other low back pain: Secondary | ICD-10-CM | POA: Diagnosis not present

## 2021-01-12 DIAGNOSIS — Z79899 Other long term (current) drug therapy: Secondary | ICD-10-CM | POA: Diagnosis not present

## 2021-01-12 DIAGNOSIS — M0630 Rheumatoid nodule, unspecified site: Secondary | ICD-10-CM | POA: Diagnosis not present

## 2021-01-12 DIAGNOSIS — I2510 Atherosclerotic heart disease of native coronary artery without angina pectoris: Secondary | ICD-10-CM | POA: Diagnosis not present

## 2021-01-12 DIAGNOSIS — M810 Age-related osteoporosis without current pathological fracture: Secondary | ICD-10-CM | POA: Diagnosis not present

## 2021-01-17 DIAGNOSIS — R5383 Other fatigue: Secondary | ICD-10-CM | POA: Diagnosis not present

## 2021-01-17 DIAGNOSIS — D509 Iron deficiency anemia, unspecified: Secondary | ICD-10-CM | POA: Diagnosis not present

## 2021-02-14 DIAGNOSIS — D509 Iron deficiency anemia, unspecified: Secondary | ICD-10-CM | POA: Diagnosis not present

## 2021-02-14 DIAGNOSIS — R5383 Other fatigue: Secondary | ICD-10-CM | POA: Diagnosis not present

## 2021-03-07 DIAGNOSIS — I2510 Atherosclerotic heart disease of native coronary artery without angina pectoris: Secondary | ICD-10-CM | POA: Diagnosis not present

## 2021-03-07 DIAGNOSIS — N3946 Mixed incontinence: Secondary | ICD-10-CM | POA: Diagnosis not present

## 2021-03-07 DIAGNOSIS — N813 Complete uterovaginal prolapse: Secondary | ICD-10-CM | POA: Diagnosis not present

## 2021-03-07 DIAGNOSIS — Z86711 Personal history of pulmonary embolism: Secondary | ICD-10-CM | POA: Diagnosis not present

## 2021-04-11 IMAGING — DX DG CHEST 2V
2 series · 2 of 2 positions shown · non-contrast
Comparison: 10/03/2019 CT. Most recent chest radiograph 05/16/2009.

CLINICAL DATA: Productive cough for 3 weeks. History of pulmonary
embolism. Ex-smoker.

EXAM:
CHEST - 2 VIEW

[chest pa]
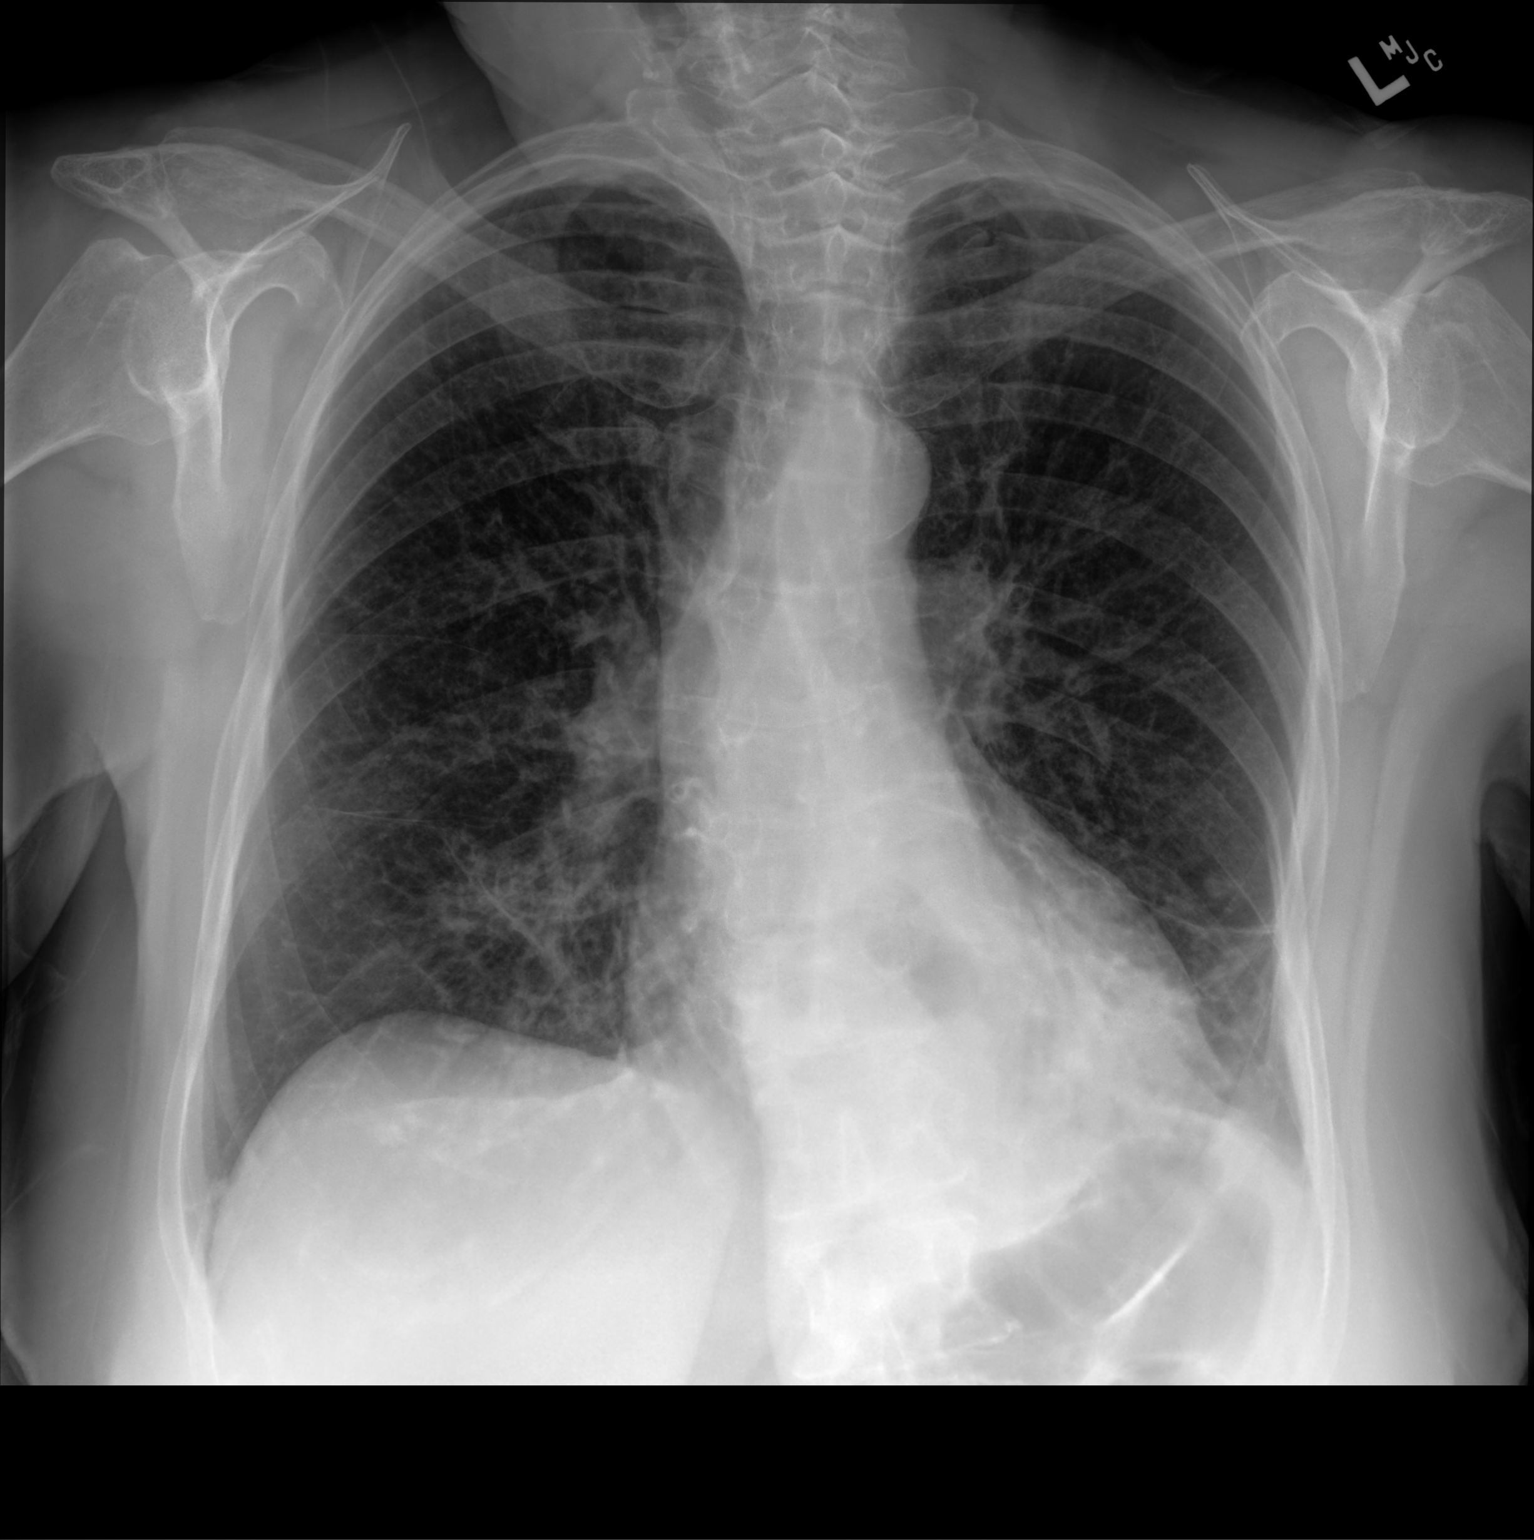

[chest lat]
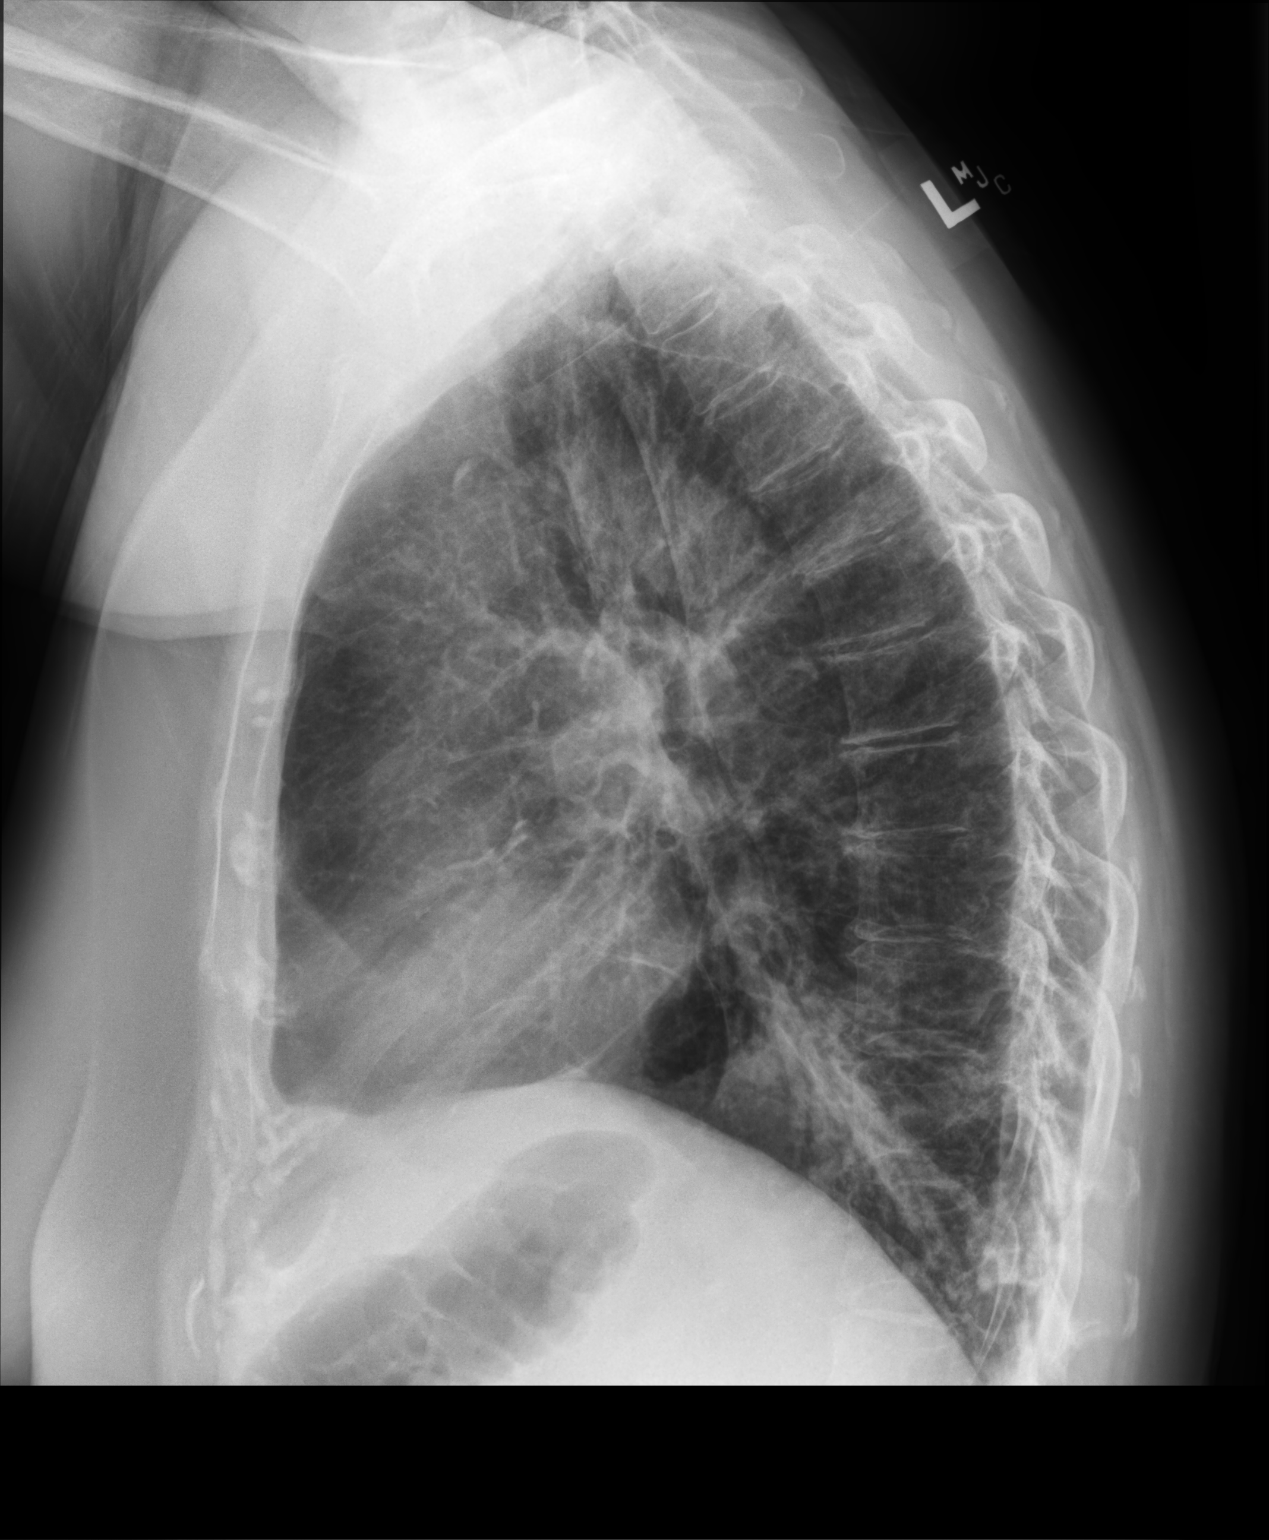

[2 of 2 positions shown; findings below may reference images not displayed]

FINDINGS: Patient rotated minimally right. Midline trachea. Normal heart size.
Moderate hiatal hernia. No pleural effusion or pneumothorax.
Relatively linear left lower lobe opacity which when correlated with
the scout film from the 10/03/2019 CT, is most consistent with
scarring. No new pulmonary opacity.
IMPRESSION: No acute cardiopulmonary disease.

Hiatal hernia.

Left lung base scarring.

## 2021-04-28 DIAGNOSIS — M25572 Pain in left ankle and joints of left foot: Secondary | ICD-10-CM | POA: Diagnosis not present

## 2021-04-28 DIAGNOSIS — M069 Rheumatoid arthritis, unspecified: Secondary | ICD-10-CM | POA: Diagnosis not present

## 2021-04-28 DIAGNOSIS — M13861 Other specified arthritis, right knee: Secondary | ICD-10-CM | POA: Diagnosis not present

## 2021-04-28 DIAGNOSIS — M25472 Effusion, left ankle: Secondary | ICD-10-CM | POA: Diagnosis not present

## 2021-05-05 DIAGNOSIS — I10 Essential (primary) hypertension: Secondary | ICD-10-CM | POA: Diagnosis not present

## 2021-05-05 DIAGNOSIS — Z8679 Personal history of other diseases of the circulatory system: Secondary | ICD-10-CM | POA: Diagnosis not present

## 2021-05-05 DIAGNOSIS — E785 Hyperlipidemia, unspecified: Secondary | ICD-10-CM | POA: Diagnosis not present

## 2021-05-05 DIAGNOSIS — I252 Old myocardial infarction: Secondary | ICD-10-CM | POA: Diagnosis not present

## 2021-05-13 ENCOUNTER — Ambulatory Visit
Admission: EM | Admit: 2021-05-13 | Discharge: 2021-05-13 | Disposition: A | Discharge status: Home / Self Care | Payer: Medicare HMO

## 2021-05-13 ENCOUNTER — Other Ambulatory Visit: Payer: Self-pay

## 2021-05-18 DIAGNOSIS — M0589 Other rheumatoid arthritis with rheumatoid factor of multiple sites: Secondary | ICD-10-CM | POA: Diagnosis not present

## 2021-05-18 DIAGNOSIS — M5136 Other intervertebral disc degeneration, lumbar region: Secondary | ICD-10-CM | POA: Diagnosis not present

## 2021-05-18 DIAGNOSIS — M150 Primary generalized (osteo)arthritis: Secondary | ICD-10-CM | POA: Diagnosis not present

## 2021-05-18 DIAGNOSIS — M79641 Pain in right hand: Secondary | ICD-10-CM | POA: Diagnosis not present

## 2021-05-18 DIAGNOSIS — M810 Age-related osteoporosis without current pathological fracture: Secondary | ICD-10-CM | POA: Diagnosis not present

## 2021-05-18 DIAGNOSIS — I2510 Atherosclerotic heart disease of native coronary artery without angina pectoris: Secondary | ICD-10-CM | POA: Diagnosis not present

## 2021-05-18 DIAGNOSIS — D649 Anemia, unspecified: Secondary | ICD-10-CM | POA: Diagnosis not present

## 2021-05-18 DIAGNOSIS — M0630 Rheumatoid nodule, unspecified site: Secondary | ICD-10-CM | POA: Diagnosis not present

## 2021-05-18 DIAGNOSIS — Z79899 Other long term (current) drug therapy: Secondary | ICD-10-CM | POA: Diagnosis not present

## 2021-05-18 DIAGNOSIS — Z86711 Personal history of pulmonary embolism: Secondary | ICD-10-CM | POA: Diagnosis not present

## 2021-06-01 ENCOUNTER — Ambulatory Visit
Admission: EM | Admit: 2021-06-01 | Discharge: 2021-06-01 | Disposition: A | Discharge status: Home / Self Care | Payer: Medicare HMO | Attending: Physician Assistant | Admitting: Physician Assistant

## 2021-06-01 ENCOUNTER — Encounter: Payer: Self-pay | Admitting: Emergency Medicine

## 2021-06-01 ENCOUNTER — Other Ambulatory Visit: Payer: Self-pay

## 2021-06-01 DIAGNOSIS — N3001 Acute cystitis with hematuria: Secondary | ICD-10-CM | POA: Diagnosis not present

## 2021-06-01 LAB — POCT URINALYSIS DIP (MANUAL ENTRY)
Bilirubin, UA: NEGATIVE
Glucose, UA: NEGATIVE mg/dL
Ketones, POC UA: NEGATIVE mg/dL
Nitrite, UA: POSITIVE — AB
Protein Ur, POC: 30 mg/dL — AB
Spec Grav, UA: 1.02 (ref 1.010–1.025)
Urobilinogen, UA: 0.2 E.U./dL
pH, UA: 6.5 (ref 5.0–8.0)

## 2021-06-01 MED ORDER — CIPROFLOXACIN HCL 500 MG PO TABS: 500.0000 mg | ORAL_TABLET | Freq: Two times a day (BID) | ORAL | 0 refills | Status: DC

## 2021-06-01 NOTE — ED Provider Notes (Signed)
[name]    CSN: [id number] Arrival date & time: 06/01/21  1129      History   Chief Complaint Chief Complaint  Patient presents with   Dysuria    HPI Janice [name] is a 75 y.o. female.   Patient here today for evaluation of dysuria, urinary frequency and urinary urgency that started about a week ago.  She reports that she has had chills but no fever.  She has had some nausea but no vomiting.  She reports she is recently started to have some back pain.  She does not report treatment for symptoms.  The history is provided by the patient.  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      Home Medications    Prior to Admission medications   Medication Sig Start Date End Date Taking? Authorizing Provider  aspirin EC 81 MG tablet Take 81 mg by mouth daily.   Yes [provider]  Biotin 5000 MCG TABS Take 1 tablet by mouth daily.    Yes [provider]  celecoxib (CELEBREX) 200 MG capsule TAKE 1 CAPSULE BY MOUTH EVERY DAY AS  NEEDED FOR PAIN 01/15/19  Yes [provider]  Cholecalciferol (D3 PO) Take 3,000 mcg by mouth.   Yes [provider]  ciprofloxacin (CIPRO) 500 MG tablet Take 1 tablet (500 mg total) by mouth every 12 (twelve) hours. 06/01/21  Yes Francene Finders, PA-C  famotidine (PEPCID) 20 MG tablet Take 20 mg by mouth 2 (two) times daily as needed. 01/01/19  Yes [provider]  Folic Acid (FOLATE PO) Take 1 tablet by mouth daily.    Yes [provider]  methotrexate (RHEUMATREX) 2.5 MG tablet Take 2.5 mg by mouth once a week. 10 tabs every Tuesday    Yes [provider]  Multiple Vitamins-Minerals (MULTIVITAL) tablet Take 1 tablet by mouth daily.     Yes [provider]  nitroGLYCERIN (NITROSTAT) 0.4 MG SL tablet PLACE 1 TABLET (0.4 MG TOTAL) UNDER THE TONGUE EVERY 5 (FIVE) MINUTES AS NEEDED FOR CHEST PAIN. 06/21/20  Yes Buford Dresser, MD  SYMBICORT 160-4.5 MCG/ACT inhaler TAKE 2 PUFFS BY MOUTH TWICE A DAY 01/10/21  Yes Mannam, Praveen, MD  vitamin B-12 (CYANOCOBALAMIN) 1000 MCG tablet Take 1,000 mcg by mouth daily.   Yes [provider]  Zinc Sulfate (ZINC 15 PO) Take by mouth in the morning, at noon, and at bedtime.   Yes [provider]  HYDROcodone-homatropine (HYCODAN) 5-1.5 MG/5ML syrup Take 5 mLs by mouth every 8 (eight) hours as needed for cough. Patient not taking: Reported on 12/15/2019 11/27/19   Ok Edwards, PA-C  Menaquinone-7 (VITAMIN K2 PO) Take by mouth.    [provider]  nitrofurantoin, macrocrystal-monohydrate, (MACROBID) 100 MG capsule Take 1 capsule (100 mg total) by mouth 2 (two) times daily. 11/12/20   Lamptey, Myrene Galas, MD  predniSONE (DELTASONE) 5 MG tablet TAKE 1 TABLET BY MOUTH EVERY DAY WITH FOOD OR MILK 01/16/19   [provider]    Family History Family History  Problem Relation Age of Onset   Cancer Mother     Social History Social History   Tobacco Use   Smoking status: Former    Smokeless tobacco: Never   Tobacco comments:    has 7 plus pack-year smoking history, but quit in 1981.   Substance Use Topics   Alcohol use: Not Currently    Comment: rare etoh    Drug use: No     Allergies   Erythromycin   Review of Systems Review of Systems  Constitutional:  Positive for chills. Negative for fever.  Respiratory:  Negative for shortness of breath.   Cardiovascular:  Negative for chest pain.  Gastrointestinal:  Positive for nausea. Negative for abdominal pain and vomiting.  Genitourinary:  Positive for dysuria and frequency.  Musculoskeletal:  Positive for back pain.    Physical Exam Triage Vital Signs ED Triage Vitals [06/01/21 1158]  Enc Vitals Group     BP (!) 148/83     Pulse Rate 89     Resp      Temp 98.5 F (36.9 C)     Temp Source Oral     SpO2 95 %     Weight 190 lb (86.2 kg)     Height 5\' 6"  (1.676 m)     Head Circumference      Peak Flow      Pain Score 5     Pain Loc      Pain Edu?      Excl. in [place]?    No data found.  Updated Vital Signs BP (!) 148/83 (BP Location: Left Arm)    Pulse 89    Temp 98.5 F (36.9 C) (Oral)    Ht 5\' 6"  (1.676 m)    Wt 190 lb (86.2 kg)    SpO2 95%    BMI 30.67 kg/m   Physical Exam Vitals and nursing note reviewed.  Constitutional:      General: She is not in acute distress.    Appearance: Normal appearance. She is not ill-appearing.  HENT:     Head: Normocephalic and atraumatic.     Nose: Nose normal.  Cardiovascular:     Rate and Rhythm: Normal rate and regular rhythm.     Heart sounds: Normal heart sounds. No murmur heard. Pulmonary:     Effort: Pulmonary effort is normal. No respiratory distress.     Breath sounds: Normal breath sounds. No wheezing, rhonchi or rales.  Skin:    General: Skin is warm and dry.  Neurological:     Mental Status: She is alert.  Psychiatric:        Mood and Affect: Mood normal.        Thought Content: Thought content normal.     UC Treatments / Results   Labs (all labs ordered are listed, but only abnormal results are displayed) Labs Reviewed  POCT URINALYSIS DIP (MANUAL  ENTRY) - Abnormal; Notable for the following components:      Result Value   Clarity, UA hazy (*)    Blood, UA trace-intact (*)    Protein Ur, POC =30 (*)    Nitrite, UA Positive (*)    Leukocytes, UA Large (3+) (*)    All other components within normal limits  URINE CULTURE    EKG   Radiology No results found.  Procedures Procedures (including critical care time)  Medications Ordered in UC Medications - No data to display  Initial Impression / Assessment and Plan / UC Course  I have reviewed the triage vital signs and the nursing notes.  Pertinent labs & imaging results that were available during my care of the patient were reviewed by me and considered in my medical decision making (see chart for details).    UA findings consistent with UTI.  Cipro prescribed for same.  Urine culture ordered.  Encouraged follow-up if symptoms fail to improve or worsen.  Final Clinical Impressions(s) / UC Diagnoses   Final diagnoses:  Acute cystitis with hematuria   Discharge Instructions   None    ED Prescriptions     Medication Sig Dispense Auth. Provider   ciprofloxacin (CIPRO) 500 MG tablet Take 1 tablet (500 mg total) by mouth every 12 (twelve) hours. 10 tablet Francene Finders, PA-C      PDMP not reviewed this encounter.   Francene Finders, PA-C 06/01/21 1222

## 2021-06-01 NOTE — ED Triage Notes (Signed)
Patient c/o dysuria, urgency and frequency x 1 week.  Afebrile.  Patient denies any OTC meds.

## 2021-06-03 LAB — URINE CULTURE: Culture: >=100000 — AB

## 2021-06-06 DIAGNOSIS — N3946 Mixed incontinence: Secondary | ICD-10-CM | POA: Diagnosis not present

## 2021-06-06 DIAGNOSIS — N813 Complete uterovaginal prolapse: Secondary | ICD-10-CM | POA: Diagnosis not present

## 2021-06-27 ENCOUNTER — Ambulatory Visit: Payer: Medicare HMO | Admitting: Podiatry

## 2021-06-27 ENCOUNTER — Ambulatory Visit (INDEPENDENT_AMBULATORY_CARE_PROVIDER_SITE_OTHER): Payer: Medicare HMO

## 2021-06-27 ENCOUNTER — Other Ambulatory Visit: Payer: Self-pay

## 2021-06-27 DIAGNOSIS — Z0000 Encounter for general adult medical examination without abnormal findings: Secondary | ICD-10-CM | POA: Insufficient documentation

## 2021-06-27 DIAGNOSIS — M2012 Hallux valgus (acquired), left foot: Secondary | ICD-10-CM | POA: Diagnosis not present

## 2021-07-01 ENCOUNTER — Encounter: Payer: Self-pay | Admitting: Podiatry

## 2021-07-03 NOTE — Progress Notes (Signed)
Subjective: 76 y.o. female presents today as a new patient for evaluation of a symptomatic bunion and hammertoe to the left foot.  Patient does have a history of bunion surgery over 25 years ago.  She says that her right foot is doing very well however over the last several years she has developed recurrence of her left foot bunion.  It is very symptomatic despite conservative care including wider shoes and shoe gear modifications.  She would like to discuss possible revision of the bunion to better align the foot and alleviate her symptoms.  She presents for further treatment and evaluation  Past Medical History:  Diagnosis Date   Acute myocardial infarction, unspecified site, episode of care unspecified    Arthritis, rheumatoid (HCC)    hx   Back pain, chronic    Pulmonary embolism (HCC)    Scoliosis    Past Surgical History:  Procedure Laterality Date   bladder tack     COLONOSCOPY     HERNIA REPAIR     TONSILLECTOMY     Allergies  Allergen Reactions   Erythromycin       Objective: Physical Exam General: The patient is alert and oriented x3 in no acute distress.  Dermatology: Skin is cool, dry and supple bilateral lower extremities. Negative for open lesions or macerations.  Vascular: Palpable pedal pulses bilaterally. No edema or erythema noted. Capillary refill within normal limits.  Neurological: Epicritic and protective threshold grossly intact bilaterally.   Musculoskeletal Exam: Clinical evidence of bunion deformity noted to the respective foot. There is moderate pain on palpation range of motion of the first MPJ. Lateral deviation of the hallux noted consistent with hallux abductovalgus.  There is also evidence of a hammertoe contracture deformity to the second digit with overlap of the third digit.  This toe is also symptomatic and rubs against her shoes.  Prominent fifth metatarsal head also noted to the left foot with lateral deviation of the metatarsal head and  prominence which irritates her foot especially in close toed shoes  Radiographic Exam: Increased intermetatarsal angle greater than 15 with a hallux abductus angle greater than 30 noted on AP view. Moderate degenerative changes noted within the first MPJ. Hammertoe contracture also noted with an elongated second metatarsal with subluxation at the MTP joint.  Lateral deviation of the hallux and second toe at the level of the MTP also noted  Assessment: 1. HAV w/ bunion deformity left 2.  Hammertoe second left 3.  Tailor's bunion left   Plan of Care:  1. Patient was evaluated. X-Rays reviewed. 2. Today we discussed the conservative versus surgical management of the presenting pathology. The patient opts for surgical management. All possible complications and details of the procedure were explained. All patient questions were answered. No guarantees were expressed or implied. 3. Authorization for surgery was initiated today. Surgery will consist of bunionectomy with osteotomy left.  Hammertoe arthroplasty with MTP capsulotomy second left.  Weil shortening osteotomy second metatarsal left.  Tailor's bunionectomy with fifth metatarsal osteotomy left. 4.  Patient does live alone.  She states that her daughter will stay with her postoperatively to assist with recovery 5.  Return to clinic 1 week postop  *Patient's daughter is Redmond Pulling, DPM [facility]  Dr. Edrick Kins, DPM    [address]  [place], [place] [address]                Office [phone]  Fax [phone]

## 2021-07-06 ENCOUNTER — Encounter: Payer: Self-pay | Admitting: Podiatry

## 2021-07-08 ENCOUNTER — Encounter: Payer: Self-pay | Admitting: Podiatry

## 2021-07-11 ENCOUNTER — Telehealth (HOSPITAL_BASED_OUTPATIENT_CLINIC_OR_DEPARTMENT_OTHER): Payer: Self-pay | Admitting: Cardiology

## 2021-07-11 NOTE — Telephone Encounter (Signed)
° °  Pre-operative Risk Assessment    Patient Name: Janice [name]  DOB: Jul 31, 1945 MRN: [id number]   **Appt scheduled with Dr. Harrell Gave on 07/25/21**    Request for Surgical Clearance    Procedure:   Bunionectomy with Osteotomy left, Hammertoe Arthroplasty with MTP Capsulotomy second left, Weil Shortening Osteotomy second metatarsal left, Tailor's Bunionectomy with fifth metatarsal left.   Date of Surgery:  Clearance 08/11/21                                 Surgeon: Daylene Katayama, DPM--would be done at outpatient surgery center Surgeon's Group or Practice Name:  [facility] Phone number:  [phone] Fax number:  [phone]   Type of Clearance Requested:   - Medical  - Pharmacy:  Hold Aspirin   (How long to hold if needed?)   Type of Anesthesia:  General    Additional requests/questions:  Please advise surgeon/provider what medications should be held. Are there any contra-indications?  Barbaraann Faster   07/11/2021, 5:03 PM    **Can respond via Maudie Flakes or fax back to number provided per Daylene Katayama, DPM.**

## 2021-07-12 ENCOUNTER — Telehealth: Payer: Self-pay | Admitting: Urology

## 2021-07-12 NOTE — Telephone Encounter (Signed)
DOS - 08/11/21  DOUBLE OSTETOTOMY LEFT --- 28299 METATARSAL OSTEOTOMY 2,5 LEFT --- 87681 CAPSULOTOMY 2 LEFT --- 15726 HAMMERTOE REPAIR 2 LEFT --- 20355   [employer] EFFECTIVE DATE - 06/19/20  SPOKE WITH KRIS L. WITH [employer] AND SHE STATED THAT FOR CPT CODES 97416, [id number], [phone] AND  [id number] NO PRIOR AUTH IS REQUIRED.   REF # [id number]

## 2021-07-19 NOTE — Telephone Encounter (Signed)
She has not been seen by [place] since 2021 and has an upcoming appt. We will evaluate at that time.

## 2021-07-22 NOTE — Progress Notes (Signed)
Cardiology Office Note:    Date:  07/25/2021   ID:  Janice [name], Janice [name] February 16, 1946, MRN [id number]  PCP:  Janice Gravel, MD  Cardiologist:  Janice Dresser, MD PhD  Referring MD: Janice Gravel, MD   CC: follow up  History of Present Illness:    Janice [name] is a 76 y.o. female with a hx of CAD with MI 2010, rheumatoid arthritis, history of prior PE (provoked after she broke her foot/casted and then placed on OCP, told clots were small), anemia of unclear etiology who is seen for follow up today. I initially met her 02/17/2019 as a new consult at the request of Janice Gravel, MD for the evaluation and management of CAD.  Today: She is in need of pre-op clearance for multiple procedures on her left foot, to be performed 08/11/21 by Daylene Katayama, DPM.  Overall, she is feeling good. She is able to be active, but has significant pain with wearing her shoes. Lately her most strenuous activity was mopping the kitchen floor. She denies any exertional symptoms at that time.  She has not needed to take nitroglycerin.  She denies any palpitations, chest pain, or shortness of breath. No lightheadedness, headaches, syncope, orthopnea, PND, or lower extremity edema. No hematuria or hematochezia.   Past Medical History:  Diagnosis Date   Acute myocardial infarction, unspecified site, episode of care unspecified    Arthritis, rheumatoid (HCC)    hx   Back pain, chronic    Pulmonary embolism (HCC)    Scoliosis     Past Surgical History:  Procedure Laterality Date   bladder tack     COLONOSCOPY     HERNIA REPAIR     TONSILLECTOMY      Current Medications: Current Outpatient Medications on File Prior to Visit  Medication Sig   aspirin EC 81 MG tablet Take 81 mg by mouth daily.   Biotin 5000 MCG TABS Take 1 tablet by mouth daily.    Calcium Carb-Cholecalciferol 600-5 MG-MCG TABS 1 tablet with food   celecoxib (CELEBREX) 200 MG capsule TAKE 1 CAPSULE BY MOUTH EVERY DAY AS NEEDED FOR  PAIN   celecoxib (CELEBREX) 200 MG capsule once daily as needed   Cholecalciferol (D3 PO) Take 3,000 mcg by mouth.   ciprofloxacin (CIPRO) 500 MG tablet Take 1 tablet (500 mg total) by mouth every 12 (twelve) hours.   ENBREL SURECLICK 50 MG/ML injection Inject into the skin.   famotidine (PEPCID) 20 MG tablet Take 20 mg by mouth 2 (two) times daily as needed.   ferrous sulfate 325 (65 FE) MG tablet 1 tablet   Folic Acid (FOLATE PO) Take 1 tablet by mouth daily.    HYDROcodone-homatropine (HYCODAN) 5-1.5 MG/5ML syrup Take 5 mLs by mouth every 8 (eight) hours as needed for cough.   Menaquinone-7 (VITAMIN K2 PO) Take by mouth.   methotrexate (RHEUMATREX) 2.5 MG tablet Take 2.5 mg by mouth once a week. 10 tabs every Tuesday    Multiple Vitamins-Minerals (MULTIVITAL) tablet Take 1 tablet by mouth daily.     nitrofurantoin, macrocrystal-monohydrate, (MACROBID) 100 MG capsule Take 1 capsule (100 mg total) by mouth 2 (two) times daily.   nitroGLYCERIN (NITROSTAT) 0.4 MG SL tablet PLACE 1 TABLET (0.4 MG TOTAL) UNDER THE TONGUE EVERY 5 (FIVE) MINUTES AS NEEDED FOR CHEST PAIN.   pantoprazole (PROTONIX) 40 MG tablet Take 1 tablet by mouth 2 (two) times daily.   predniSONE (DELTASONE) 5 MG tablet TAKE 1 TABLET BY MOUTH EVERY DAY WITH  FOOD OR MILK   SYMBICORT 160-4.5 MCG/ACT inhaler TAKE 2 PUFFS BY MOUTH TWICE A DAY   traMADol (ULTRAM) 50 MG tablet 1 tablet as needed   vitamin B-12 (CYANOCOBALAMIN) 1000 MCG tablet Take 1,000 mcg by mouth daily.   Zinc Sulfate (ZINC 15 PO) Take by mouth in the morning, at noon, and at bedtime.   alendronate (FOSAMAX) 70 MG tablet alendronate 70 mg tablet (Patient not taking: Reported on 07/25/2021)   No current facility-administered medications on file prior to visit.     Allergies:   Erythromycin   Social History   Tobacco Use   Smoking status: Former   Smokeless tobacco: Never   Tobacco comments:    has 7 plus pack-year smoking history, but quit in 1981.    Substance Use Topics   Alcohol use: Not Currently    Comment: rare etoh    Drug use: No     Family History: The patient's family history includes Cancer in her mother.  ROS:   Please see the history of present illness. (+) Bilateral foot pain All other systems are reviewed and negative.    EKGs/Labs/Other Studies Reviewed:    The following studies were reviewed today:  CT Chest 10/03/2019: FINDINGS: Cardiovascular: Heart is normal size. Calcified plaque over the left main, left anterior descending and lateral circumflex coronary arteries. Thoracic aorta is normal in caliber. Remaining vascular structures are unremarkable.   Mediastinum/Nodes: No significant mediastinal or hilar adenopathy. Remaining mediastinal structures are notable for moderate size hiatal hernia.   Lungs/Pleura: Lungs are adequately inflated and demonstrate a small left pleural effusion with mild linear atelectasis over the lingula and left lower lobe. There is a pleural based 7-8 mm nodular density without significant change compared to 2010 and therefore benign. 3 mm nodular density over the left apex unchanged. Minimal linear scarring over the posterior right lower lobe. Mild bronchiectatic change over the medial lower lobes. Minimal opacification over several distal bronchi in this region suggesting mild mucous plugging.   Upper Abdomen: Mild calcified plaque over the abdominal aorta. No acute findings.   Musculoskeletal: Mild degenerative change of the spine.   IMPRESSION: 1. Small left pleural effusion. Minimal linear scarring over the lingula, left lower lobe and posterior right base. Mild associated bronchiectasis in the medial lower lobes with subtle mucous plugging.   2. Aortic Atherosclerosis (ICD10-I70.0). Atherosclerotic coronary artery disease.   3.  Moderate size hiatal hernia.  Echo 03/05/19  1. The left ventricle has normal systolic function, with an ejection fraction of  55-60%. The cavity size was normal. Left ventricular diastolic Doppler parameters are consistent with impaired relaxation.  2. The right ventricle has normal systolic function. The cavity was normal. There is no increase in right ventricular wall thickness.  3. The mitral valve is grossly normal. There is mild mitral annular calcification present.  4. The aortic valve is tricuspid. Moderate thickening of the aortic valve. Moderate calcification of the aortic valve. Aortic valve regurgitation is trivial by color flow Doppler.  5. The aorta is normal unless otherwise noted.  Lexiscan 03/05/19 Nuclear stress EF: 47%. visually, the EF appears to be greater than 47% and appears to be normal There was no ST segment deviation noted during stress. This is a low risk study. no evidence of ischemia or previous infarction The study is normal.   Cath 05/17/2009: LM nonobstructive CAD LAD sequential 30% lesions and distal diffuse plaque LCx no obstructive disease RCA no obstructive disease  Cardiac Cath Findings:  Left ventricular angiogram was performed in the RAO projection       which shows mild global left ventricular systolic dysfunction with       ejection fraction of 45%.  No significant mitral regurgitation is       noted.      IMPRESSION:   1. Mild nonobstructive coronary artery disease.   2. Mild global left ventricular systolic dysfunction.      RECOMMENDATIONS:  I recommend medical management at this time.  We will   start a statin medication as well as a low dose of an ACE inhibitor.   The patient will be treated with Plavix for 1 month given her acute   coronary syndrome during this presentation.  We will check an   echocardiogram today.  She will be moved to a telemetry bed with her   heart rhythm monitored over the next 24 hours.   Echo 05/21/2009   - Left ventricle: The cavity size was normal. Systolic function was     normal. The estimated ejection fraction was in the range  of 55% to     60%. Wall motion was normal; there were no regional wall motion     abnormalities. Doppler parameters are consistent with abnormal     left ventricular relaxation (grade 1 diastolic dysfunction).   - Right ventricle: The cavity size was mildly dilated.   - Right atrium: The atrium was mildly dilated.   Impressions:    - Echobright area at RV apex on apical four chamber view most likely     represents prominent trabeculae.   EKG:  EKG is personally reviewed.   07/25/2021: NSR at 66 bpm 02/17/19: NSR, PRWP  Recent Labs: No results found for requested labs within last 8760 hours.   Recent Lipid Panel    Component Value Date/Time   CHOL  05/17/2009 1230    151        ATP III CLASSIFICATION:  <200     mg/dL   Desirable  200-239  mg/dL   Borderline High  >=240    mg/dL   High          TRIG 76 05/17/2009 1230   HDL 58 05/17/2009 1230   CHOLHDL 2.6 05/17/2009 1230   VLDL 15 05/17/2009 1230   LDLCALC  05/17/2009 1230    78        Total Cholesterol/HDL:CHD Risk Coronary Heart Disease Risk Table                     Men   Women  1/2 Average Risk   3.4   3.3  Average Risk       5.0   4.4  2 X Average Risk   9.6   7.1  3 X Average Risk  23.4   11.0        Use the calculated Patient Ratio above and the CHD Risk Table to determine the patient's CHD Risk.        ATP III CLASSIFICATION (LDL):  <100     mg/dL   Optimal  100-129  mg/dL   Near or Above                    Optimal  130-159  mg/dL   Borderline  160-189  mg/dL   High  >190     mg/dL   Very High    Physical Exam:    VS:  BP 128/80  Pulse 66    Ht '5\' 6"'  (1.676 m)    Wt 187 lb 14.4 oz (85.2 kg)    SpO2 97%    BMI 30.33 kg/m     Wt Readings from Last 3 Encounters:  07/25/21 187 lb 14.4 oz (85.2 kg)  06/01/21 190 lb (86.2 kg)  12/15/19 185 lb 6.4 oz (84.1 kg)    GEN: Well nourished, well developed in no acute distress HEENT: Normal, moist mucous membranes NECK: No JVD CARDIAC: regular rhythm, normal  S1 and S2, no rubs or gallops. No murmur. VASCULAR: Radial and DP pulses 2+ bilaterally. No carotid bruits RESPIRATORY:  Clear to auscultation without rales, wheezing or rhonchi  ABDOMEN: Soft, non-tender, non-distended MUSCULOSKELETAL:  Ambulates independently SKIN: Warm and dry, trace bilateral LE edema NEUROLOGIC:  Alert and oriented x 3. No focal neuro deficits noted. PSYCHIATRIC:  Normal affect   ASSESSMENT:    1. Pre-op evaluation   2. Pure hypercholesterolemia   3. Nonocclusive coronary atherosclerosis of native coronary artery   4. History of MI (myocardial infarction)   5. Cardiac risk counseling   6. Counseling on health promotion and disease prevention     PLAN:    Preoperative cardiovascular evaluation: -She denies any active symptoms, and she can achieve 3.5-4 METs of activity, limitations due to foot pain.  -prior echo, stress test reassuring -Based on available date, patient's RCRI score = 1 for prior MI, which carries a 6% 30-day risk of death, MI, or cardiac arrest.  According to ACC/AHA Guidelines, no further testing is needed.  Proceed with surgery at acceptable risk.     Aspirin can be held 3-5 days prior to procedure, based on anticipated bleeding risk of surgery, and it should be restarted as soon as cleared from a surgical perspective.  History of MI, nonobstructive CAD Hypercholesterolemia -continue ASA 81 mg -would not use celecoxib given her prior history of MI, but she states this helps substantially with her pain. She prefers to keep -continue atorvastatin 40 mg daily -goal LDL <70.  Cardiac risk counseling and prevention recommendations: -recommend heart healthy/Mediterranean diet, with whole grains, fruits, vegetable, fish, lean meats, nuts, and olive oil. Limit salt. -recommend moderate walking, 3-5 times/week for 30-50 minutes each session. Aim for at least 150 minutes.week. Goal should be pace of 3 miles/hours, or walking 1.5 miles in 30  minutes -recommend avoidance of tobacco products. Avoid excess alcohol.  Plan for follow up: 2 years or sooner PRN  Janice Dresser, MD, PhD [place]   [facility]   Medication Adjustments/Labs and Tests Ordered: Current medicines are reviewed at length with the patient today.  Concerns regarding medicines are outlined above.   Orders Placed This Encounter  Procedures   EKG 12-Lead   No orders of the defined types were placed in this encounter.  Patient Instructions  Medication Instructions:  Your Physician recommend you continue on your current medication as directed.    *If you need a refill on your cardiac medications before your next appointment, please call your pharmacy*   Lab Work: None ordered today   Testing/Procedures: None ordered today   Follow-Up: At [facility], you and your health needs are our priority.  As part of our continuing mission to provide you with exceptional heart care, we have created designated Provider Care Teams.  These Care Teams include your primary Cardiologist (physician) and Advanced Practice Providers (APPs -  Physician Assistants and Nurse Practitioners) who all work together to provide you with the care  you need, when you need it.  We recommend signing up for the patient portal called "MyChart".  Sign up information is provided on this After Visit Summary.  MyChart is used to connect with patients for Virtual Visits (Telemedicine).  Patients are able to view lab/test results, encounter notes, upcoming appointments, etc.  Non-urgent messages can be sent to your provider as well.   To learn more about what you can do with MyChart, go to [email or web address].    Your next appointment:   2 year(s)  The format for your next appointment:   In Person  Provider:   Buford Dresser, MD       [facility],acting as a scribe for Janice Dresser, MD.,have documented all relevant documentation on the behalf of  Janice Dresser, MD,as directed by  Janice Dresser, MD while in the presence of Janice Dresser, MD.  I, Janice Dresser, MD, have reviewed all documentation for this visit. The documentation on 07/25/21 for the exam, diagnosis, procedures, and orders are all accurate and complete.   Signed, Janice Dresser, MD PhD 07/25/2021  [place]

## 2021-07-25 ENCOUNTER — Other Ambulatory Visit: Payer: Self-pay

## 2021-07-25 ENCOUNTER — Encounter (HOSPITAL_BASED_OUTPATIENT_CLINIC_OR_DEPARTMENT_OTHER): Payer: Self-pay | Admitting: Cardiology

## 2021-07-25 ENCOUNTER — Ambulatory Visit (HOSPITAL_BASED_OUTPATIENT_CLINIC_OR_DEPARTMENT_OTHER): Payer: Medicare HMO | Admitting: Cardiology

## 2021-07-25 VITALS — BP 128/80 | HR 66 | Ht 66.0 in | Wt 187.9 lb

## 2021-07-25 DIAGNOSIS — I2510 Atherosclerotic heart disease of native coronary artery without angina pectoris: Secondary | ICD-10-CM | POA: Diagnosis not present

## 2021-07-25 DIAGNOSIS — Z01818 Encounter for other preprocedural examination: Secondary | ICD-10-CM

## 2021-07-25 DIAGNOSIS — I252 Old myocardial infarction: Secondary | ICD-10-CM

## 2021-07-25 DIAGNOSIS — Z7189 Other specified counseling: Secondary | ICD-10-CM | POA: Diagnosis not present

## 2021-07-25 DIAGNOSIS — E7800 Pure hypercholesterolemia, unspecified: Secondary | ICD-10-CM | POA: Diagnosis not present

## 2021-07-25 NOTE — Patient Instructions (Signed)

## 2021-08-08 DIAGNOSIS — R300 Dysuria: Secondary | ICD-10-CM | POA: Diagnosis not present

## 2021-08-08 DIAGNOSIS — N390 Urinary tract infection, site not specified: Secondary | ICD-10-CM | POA: Diagnosis not present

## 2021-08-10 DIAGNOSIS — D649 Anemia, unspecified: Secondary | ICD-10-CM | POA: Diagnosis not present

## 2021-08-10 DIAGNOSIS — M5136 Other intervertebral disc degeneration, lumbar region: Secondary | ICD-10-CM | POA: Diagnosis not present

## 2021-08-10 DIAGNOSIS — I2510 Atherosclerotic heart disease of native coronary artery without angina pectoris: Secondary | ICD-10-CM | POA: Diagnosis not present

## 2021-08-10 DIAGNOSIS — M0589 Other rheumatoid arthritis with rheumatoid factor of multiple sites: Secondary | ICD-10-CM | POA: Diagnosis not present

## 2021-08-10 DIAGNOSIS — M0630 Rheumatoid nodule, unspecified site: Secondary | ICD-10-CM | POA: Diagnosis not present

## 2021-08-10 DIAGNOSIS — M150 Primary generalized (osteo)arthritis: Secondary | ICD-10-CM | POA: Diagnosis not present

## 2021-08-10 DIAGNOSIS — Z86711 Personal history of pulmonary embolism: Secondary | ICD-10-CM | POA: Diagnosis not present

## 2021-08-10 DIAGNOSIS — Z79899 Other long term (current) drug therapy: Secondary | ICD-10-CM | POA: Diagnosis not present

## 2021-08-10 DIAGNOSIS — M810 Age-related osteoporosis without current pathological fracture: Secondary | ICD-10-CM | POA: Diagnosis not present

## 2021-08-17 ENCOUNTER — Encounter: Payer: Medicare HMO | Admitting: Podiatry

## 2021-08-18 ENCOUNTER — Other Ambulatory Visit: Payer: Self-pay | Admitting: Pulmonary Disease

## 2021-08-24 ENCOUNTER — Encounter: Payer: Medicare HMO | Admitting: Podiatry

## 2021-09-07 ENCOUNTER — Encounter: Payer: Medicare HMO | Admitting: Podiatry

## 2021-09-26 DIAGNOSIS — N390 Urinary tract infection, site not specified: Secondary | ICD-10-CM | POA: Diagnosis not present

## 2021-09-26 DIAGNOSIS — R109 Unspecified abdominal pain: Secondary | ICD-10-CM | POA: Diagnosis not present

## 2021-09-26 DIAGNOSIS — N8110 Cystocele, unspecified: Secondary | ICD-10-CM | POA: Diagnosis not present

## 2021-10-20 DIAGNOSIS — R1011 Right upper quadrant pain: Secondary | ICD-10-CM | POA: Diagnosis not present

## 2021-10-20 DIAGNOSIS — D508 Other iron deficiency anemias: Secondary | ICD-10-CM | POA: Diagnosis not present

## 2021-10-20 DIAGNOSIS — E559 Vitamin D deficiency, unspecified: Secondary | ICD-10-CM | POA: Diagnosis not present

## 2021-10-20 DIAGNOSIS — R5381 Other malaise: Secondary | ICD-10-CM | POA: Diagnosis not present

## 2021-10-20 DIAGNOSIS — D509 Iron deficiency anemia, unspecified: Secondary | ICD-10-CM | POA: Diagnosis not present

## 2021-10-20 DIAGNOSIS — K219 Gastro-esophageal reflux disease without esophagitis: Secondary | ICD-10-CM | POA: Diagnosis not present

## 2021-10-20 DIAGNOSIS — I10 Essential (primary) hypertension: Secondary | ICD-10-CM | POA: Diagnosis not present

## 2021-10-20 DIAGNOSIS — M069 Rheumatoid arthritis, unspecified: Secondary | ICD-10-CM | POA: Diagnosis not present

## 2021-10-20 DIAGNOSIS — M159 Polyosteoarthritis, unspecified: Secondary | ICD-10-CM | POA: Diagnosis not present

## 2021-10-20 DIAGNOSIS — E7800 Pure hypercholesterolemia, unspecified: Secondary | ICD-10-CM | POA: Diagnosis not present

## 2021-10-20 DIAGNOSIS — R69 Illness, unspecified: Secondary | ICD-10-CM | POA: Diagnosis not present

## 2021-10-20 DIAGNOSIS — R102 Pelvic and perineal pain: Secondary | ICD-10-CM | POA: Diagnosis not present

## 2021-10-20 DIAGNOSIS — N8110 Cystocele, unspecified: Secondary | ICD-10-CM | POA: Diagnosis not present

## 2021-10-20 DIAGNOSIS — R5383 Other fatigue: Secondary | ICD-10-CM | POA: Diagnosis not present

## 2021-10-25 DIAGNOSIS — N83201 Unspecified ovarian cyst, right side: Secondary | ICD-10-CM | POA: Diagnosis not present

## 2021-10-25 DIAGNOSIS — I2510 Atherosclerotic heart disease of native coronary artery without angina pectoris: Secondary | ICD-10-CM | POA: Diagnosis not present

## 2021-10-25 DIAGNOSIS — M4316 Spondylolisthesis, lumbar region: Secondary | ICD-10-CM | POA: Diagnosis not present

## 2021-10-25 DIAGNOSIS — M419 Scoliosis, unspecified: Secondary | ICD-10-CM | POA: Diagnosis not present

## 2021-10-25 DIAGNOSIS — I700 Atherosclerosis of aorta: Secondary | ICD-10-CM | POA: Diagnosis not present

## 2021-10-25 DIAGNOSIS — J90 Pleural effusion, not elsewhere classified: Secondary | ICD-10-CM | POA: Diagnosis not present

## 2021-10-25 DIAGNOSIS — R102 Pelvic and perineal pain: Secondary | ICD-10-CM | POA: Diagnosis not present

## 2021-10-25 DIAGNOSIS — K5730 Diverticulosis of large intestine without perforation or abscess without bleeding: Secondary | ICD-10-CM | POA: Diagnosis not present

## 2021-10-25 DIAGNOSIS — N83202 Unspecified ovarian cyst, left side: Secondary | ICD-10-CM | POA: Diagnosis not present

## 2021-10-25 DIAGNOSIS — K449 Diaphragmatic hernia without obstruction or gangrene: Secondary | ICD-10-CM | POA: Diagnosis not present

## 2021-10-25 DIAGNOSIS — K5790 Diverticulosis of intestine, part unspecified, without perforation or abscess without bleeding: Secondary | ICD-10-CM | POA: Diagnosis not present

## 2021-10-25 DIAGNOSIS — N281 Cyst of kidney, acquired: Secondary | ICD-10-CM | POA: Diagnosis not present

## 2021-10-28 DIAGNOSIS — R338 Other retention of urine: Secondary | ICD-10-CM | POA: Diagnosis not present

## 2021-10-28 DIAGNOSIS — N813 Complete uterovaginal prolapse: Secondary | ICD-10-CM | POA: Diagnosis not present

## 2021-10-28 DIAGNOSIS — N952 Postmenopausal atrophic vaginitis: Secondary | ICD-10-CM | POA: Diagnosis not present

## 2021-10-28 DIAGNOSIS — N3946 Mixed incontinence: Secondary | ICD-10-CM | POA: Diagnosis not present

## 2021-10-28 DIAGNOSIS — N816 Rectocele: Secondary | ICD-10-CM | POA: Diagnosis not present

## 2021-11-01 ENCOUNTER — Telehealth: Payer: Self-pay | Admitting: Pulmonary Disease

## 2021-11-01 DIAGNOSIS — J90 Pleural effusion, not elsewhere classified: Secondary | ICD-10-CM | POA: Diagnosis not present

## 2021-11-01 DIAGNOSIS — R109 Unspecified abdominal pain: Secondary | ICD-10-CM | POA: Diagnosis not present

## 2021-11-01 DIAGNOSIS — R1011 Right upper quadrant pain: Secondary | ICD-10-CM | POA: Diagnosis not present

## 2021-11-02 ENCOUNTER — Encounter: Payer: Self-pay | Admitting: Pulmonary Disease

## 2021-11-02 ENCOUNTER — Ambulatory Visit: Payer: Medicare HMO | Admitting: Pulmonary Disease

## 2021-11-02 ENCOUNTER — Telehealth: Payer: Self-pay | Admitting: Pulmonary Disease

## 2021-11-02 ENCOUNTER — Ambulatory Visit (INDEPENDENT_AMBULATORY_CARE_PROVIDER_SITE_OTHER)
Admission: RE | Admit: 2021-11-02 | Discharge: 2021-11-02 | Disposition: A | Discharge status: Home / Self Care | Payer: Medicare HMO | Source: Ambulatory Visit | Attending: Pulmonary Disease | Admitting: Pulmonary Disease

## 2021-11-02 VITALS — BP 118/72 | HR 88 | Temp 98.3°F | Ht 66.0 in | Wt 181.2 lb

## 2021-11-02 DIAGNOSIS — J479 Bronchiectasis, uncomplicated: Secondary | ICD-10-CM

## 2021-11-02 DIAGNOSIS — K449 Diaphragmatic hernia without obstruction or gangrene: Secondary | ICD-10-CM | POA: Diagnosis not present

## 2021-11-02 DIAGNOSIS — R1310 Dysphagia, unspecified: Secondary | ICD-10-CM | POA: Diagnosis not present

## 2021-11-02 DIAGNOSIS — J90 Pleural effusion, not elsewhere classified: Secondary | ICD-10-CM

## 2021-11-02 LAB — CBC WITH DIFFERENTIAL/PLATELET
Basophils Absolute: 0 10*3/uL (ref 0.0–0.1)
Basophils Relative: 0.8 % (ref 0.0–3.0)
Eosinophils Absolute: 0.4 10*3/uL (ref 0.0–0.7)
Eosinophils Relative: 7.2 % — ABNORMAL HIGH (ref 0.0–5.0)
HCT: 34.2 % — ABNORMAL LOW (ref 36.0–46.0)
Hemoglobin: 10.8 g/dL — ABNORMAL LOW (ref 12.0–15.0)
Lymphocytes Relative: 14.8 % (ref 12.0–46.0)
Lymphs Abs: 0.9 10*3/uL (ref 0.7–4.0)
MCHC: 31.6 g/dL (ref 30.0–36.0)
MCV: 83.5 fl (ref 78.0–100.0)
Monocytes Absolute: 0.6 10*3/uL (ref 0.1–1.0)
Monocytes Relative: 9.3 % (ref 3.0–12.0)
Neutro Abs: 4.1 10*3/uL (ref 1.4–7.7)
Neutrophils Relative %: 67.9 % (ref 43.0–77.0)
Platelets: 535 10*3/uL — ABNORMAL HIGH (ref 150.0–400.0)
RBC: 4.1 Mil/uL (ref 3.87–5.11)
RDW: 19.7 % — ABNORMAL HIGH (ref 11.5–15.5)
WBC: 6.1 10*3/uL (ref 4.0–10.5)

## 2021-11-02 LAB — BASIC METABOLIC PANEL
BUN: 17 mg/dL (ref 6–23)
CO2: 25 mEq/L (ref 19–32)
Calcium: 9.8 mg/dL (ref 8.4–10.5)
Chloride: 102 mEq/L (ref 96–112)
Creatinine, Ser: 0.71 mg/dL (ref 0.40–1.20)
GFR: 83.69 mL/min (ref >60.00)
Glucose, Bld: 114 mg/dL — ABNORMAL HIGH (ref 70–99)
Potassium: 4 mEq/L (ref 3.5–5.1)
Sodium: 138 mEq/L (ref 135–145)

## 2021-11-02 LAB — C-REACTIVE PROTEIN: CRP: 4 mg/dL (ref 0.5–20.0)

## 2021-11-02 MED ORDER — IOHEXOL 300 MG/ML  SOLN
80.0000 mL | Freq: Once | INTRAMUSCULAR | Status: AC | PRN
  Administered 2021-11-02: 80 mL via INTRAVENOUS

## 2021-11-02 NOTE — Patient Instructions (Addendum)
We will schedule you for CT Chest scan for contrast to evaluate the loculated pleural effusion. We will call you with the results and next steps. ? ?We will check labs today for the scan and for possible rheumatoid involvement of the effusion. ? ?Complete course of doxycyline for now ? ? ?

## 2021-11-02 NOTE — Telephone Encounter (Signed)
Okay with me 

## 2021-11-02 NOTE — H&P (View-Only) (Signed)
Synopsis: Referred in May 2023 for Acute visit  Subjective:   PATIENT ID: [name]: female DOB: [birth year], MRN: [id number]   HPI  Chief Complaint  Patient presents with   Acute Visit    Increased SOB over the past 2-3 weeks. Recently had a CT at [facility] that showed pleural effusion. Non-productive cough.     Talah Cookston is a 76 year old woman, former smoker with rheumatoid arthritis on methotrexate and enbrel and bronchiectasis who returns to pulmonary clinic for acute visit for progressive shortness of breath.   She has progressive dyspnea over the past [redacted] weeks along with occasional night sweats that require her to change her clothes/sheets. She denies fevers and chills. She complained of right sided flank pain and anterior chest pain that was worse with cough and deep inspiration. She had CT abdominal scan at [facility] that noted basilar infiltrates, bronchiectasis and loculated right pleural effusion. She was started on doxycycline which has helped improve her symptoms somewhat. She most recently had night sweats last night. She reports increase symptoms with trouble swallowing solids over the past month.   She has history of rheumatoid arthritis which tends to affect her hands mostly.   She has been on symbiocrt 160-4.58mg for her breathing.   She is accompanied by her daughter.    Past Medical History:  Diagnosis Date   Acute myocardial infarction, unspecified site, episode of care unspecified    Arthritis, rheumatoid (HCC)    hx   Back pain, chronic    Pulmonary embolism (HCC)    Scoliosis      Family History  Problem Relation Age of Onset   Cancer Mother      Social History   Socioeconomic History   Marital status: Married    Spouse name: Not on file   Number of children: Not on file   Years of education: Not on file   Highest education level: Not on file  Occupational History   Not on file  Tobacco Use   Smoking status: Former    Smokeless tobacco: Never   Tobacco comments:    has 7 plus pack-year smoking history, but quit in 1981.   Substance and Sexual Activity   Alcohol use: Not Currently    Comment: rare etoh    Drug use: No   Sexual activity: Not Currently  Other Topics Concern   Not on file  Social History Narrative   [place] She works full time in a [employer] doing nonexertional work. No herbal medications, regular diet. No regular exercise.    Social Determinants of Health   Financial Resource Strain: Not on file  Food Insecurity: Not on file  Transportation Needs: Not on file  Physical Activity: Not on file  Stress: Not on file  Social Connections: Not on file  Intimate Partner Violence: Not on file     Allergies  Allergen Reactions   Erythromycin      Outpatient Medications Prior to Visit  Medication Sig Dispense Refill   aspirin EC 81 MG tablet Take 81 mg by mouth daily.     Biotin 5000 MCG TABS Take 1 tablet by mouth daily.      Calcium Carb-Cholecalciferol 600-5 MG-MCG TABS 1 tablet with food     doxycycline (VIBRAMYCIN) 100 MG capsule Take 100 mg by mouth 2 (two) times daily.     ENBREL SURECLICK 50 MG/ML injection Inject into the skin.  famotidine (PEPCID) 20 MG tablet Take 20 mg by mouth 2 (two) times daily as needed.     methotrexate (RHEUMATREX) 2.5 MG tablet Take 2.5 mg by mouth once a week. 10 tabs every Tuesday      Multiple Vitamins-Minerals (MULTIVITAL) tablet Take 1 tablet by mouth daily.       nitroGLYCERIN (NITROSTAT) 0.4 MG SL tablet PLACE 1 TABLET (0.4 MG TOTAL) UNDER THE TONGUE EVERY 5 (FIVE) MINUTES AS NEEDED FOR CHEST PAIN. 25 tablet 3   pantoprazole (PROTONIX) 40 MG tablet Take 1 tablet by mouth 2 (two) times daily.     SYMBICORT 160-4.5 MCG/ACT inhaler TAKE 2 PUFFS BY MOUTH TWICE A DAY 10.2 each 5   vitamin B-12 (CYANOCOBALAMIN) 1000 MCG tablet Take 1,000 mcg by mouth daily.     Zinc Sulfate (ZINC 15 PO) Take by mouth  in the morning, at noon, and at bedtime.     alendronate (FOSAMAX) 70 MG tablet alendronate 70 mg tablet (Patient not taking: Reported on 07/25/2021)     celecoxib (CELEBREX) 200 MG capsule TAKE 1 CAPSULE BY MOUTH EVERY DAY AS NEEDED FOR PAIN     celecoxib (CELEBREX) 200 MG capsule once daily as needed     Cholecalciferol (D3 PO) Take 3,000 mcg by mouth.     ciprofloxacin (CIPRO) 500 MG tablet Take 1 tablet (500 mg total) by mouth every 12 (twelve) hours. 10 tablet 0   ferrous sulfate 325 (65 FE) MG tablet 1 tablet     Folic Acid (FOLATE PO) Take 1 tablet by mouth daily.      HYDROcodone-homatropine (HYCODAN) 5-1.5 MG/5ML syrup Take 5 mLs by mouth every 8 (eight) hours as needed for cough. 60 mL 0   Menaquinone-7 (VITAMIN K2 PO) Take by mouth.     nitrofurantoin, macrocrystal-monohydrate, (MACROBID) 100 MG capsule Take 1 capsule (100 mg total) by mouth 2 (two) times daily. 10 capsule 0   predniSONE (DELTASONE) 5 MG tablet TAKE 1 TABLET BY MOUTH EVERY DAY WITH FOOD OR MILK     traMADol (ULTRAM) 50 MG tablet 1 tablet as needed     No facility-administered medications prior to visit.   Review of Systems  Constitutional:  Positive for diaphoresis. Negative for chills, fever, malaise/fatigue and weight loss.  HENT:  Negative for congestion, sinus pain and sore throat.   Eyes: Negative.   Respiratory:  Positive for shortness of breath. Negative for cough, hemoptysis, sputum production and wheezing.   Cardiovascular:  Positive for chest pain. Negative for palpitations, orthopnea, claudication and leg swelling.  Gastrointestinal:  Negative for abdominal pain, heartburn, nausea and vomiting.  Genitourinary: Negative.   Musculoskeletal:  Negative for joint pain and myalgias.  Skin:  Negative for rash.  Neurological:  Negative for weakness.  Endo/Heme/Allergies: Negative.   Psychiatric/Behavioral: Negative.     Objective:   Vitals:   11/02/21 1052  BP: 118/72  Pulse: 88  Temp: 98.3 F (36.8  C)  TempSrc: Oral  SpO2: 96%  Weight: 181 lb 3.2 oz (82.2 kg)  Height: '5\' 6"'$  (1.676 m)   Physical Exam Constitutional:      General: She is not in acute distress.    Appearance: She is not ill-appearing.  HENT:     Head: Normocephalic and atraumatic.  Eyes:     General: No scleral icterus.    Conjunctiva/sclera: Conjunctivae normal.     Pupils: Pupils are equal, round, and reactive to light.  Cardiovascular:     Rate and Rhythm: Normal rate and  regular rhythm.     Pulses: Normal pulses.     Heart sounds: Normal heart sounds. No murmur heard. Pulmonary:     Effort: Pulmonary effort is normal.     Breath sounds: Examination of the right-lower field reveals decreased breath sounds. Decreased breath sounds present. No wheezing, rhonchi or rales.  Abdominal:     General: Bowel sounds are normal.     Palpations: Abdomen is soft.  Musculoskeletal:     Right lower leg: No edema.     Left lower leg: No edema.  Lymphadenopathy:     Cervical: No cervical adenopathy.  Skin:    General: Skin is warm and dry.  Neurological:     General: No focal deficit present.     Mental Status: She is alert.  Psychiatric:        Mood and Affect: Mood normal.        Behavior: Behavior normal.        Thought Content: Thought content normal.        Judgment: Judgment normal.    CBC    Component Value Date/Time   WBC 6.1 11/02/2021 1148   RBC 4.10 11/02/2021 1148   HGB 10.8 (L) 11/02/2021 1148   HCT 34.2 (L) 11/02/2021 1148   PLT 535.0 (H) 11/02/2021 1148   MCV 83.5 11/02/2021 1148   MCHC 31.6 11/02/2021 1148   RDW 19.7 (H) 11/02/2021 1148   LYMPHSABS 0.9 11/02/2021 1148   MONOABS 0.6 11/02/2021 1148   EOSABS 0.4 11/02/2021 1148   BASOSABS 0.0 11/02/2021 1148      Latest Ref Rng & Units 11/02/2021   11:48 AM 05/16/2009   10:47 AM  BMP  Glucose 70 - 99 mg/dL 114   113    BUN 6 - 23 mg/dL 17   14    Creatinine 0.40 - 1.20 mg/dL 0.71   0.96    Sodium 135 - 145 mEq/L 138   145     Potassium 3.5 - 5.1 mEq/L 4.0   4.1    Chloride 96 - 112 mEq/L 102   110    CO2 19 - 32 mEq/L 25   28    Calcium 8.4 - 10.5 mg/dL 9.8   9.4     Chest imaging: CT Chest w/contrast 11/02/21 Mediastinum/Nodes: No enlarged mediastinal, hilar, or axillary lymph nodes. There is a moderate hiatal hernia. Thyroid gland, trachea, and esophagus demonstrate no significant findings.   Lungs/Pleura: There is a small loculated right pleural effusion on the right with fluid both anteriorly and posteriorly. There is mild associated atelectasis with no significant pleural thickening or pulmonary mass. There is no pleural effusion on the left and the left lung is clear. There is no pneumothorax on either side. Bilateral bronchiectasis with a lower lung predominance is noted.  PFT:     View : No data to display.          Labs:  Path:  Echo:  Heart Catheterization:  Assessment & Plan:   Loculated pleural effusion - Plan: Basic Metabolic Panel (BMET), CBC with Differential, C-reactive protein, Rheumatoid Factor, Cyclic citrul peptide antibody, IgG, ANA w/Reflex, CT Chest W Contrast, ANA w/Reflex, Cyclic citrul peptide antibody, IgG, Rheumatoid Factor, C-reactive protein, CBC with Differential, Basic Metabolic Panel (BMET), CANCELED: CT Chest W Contrast  Dysphagia, unspecified type - Plan: SLP modified barium swallow  Hiatal hernia  Bronchiectasis without complication (HCC)  Discussion: Mitsuko Luera is a 76 year old woman, former smoker with rheumatoid arthritis on  methotrexate and enbrel and bronchiectasis who returns to pulmonary clinic for acute visit for progressive shortness of breath.   She has loculated right pleural effusion concerning for possible parapneumonic effusion vs rheumatoid arthritis associated pleural effusion. She has hiatal hernia on CT Chest scan and recent history concerning for increased aspiration risk.   We will check general inflammatory markers and RA  markers to evaluate for disease activity.   We will plan for thoracentesis to sample the pleural fluid if amenable based on ultrasound evaluation versus empirically treating with course of augmentin.  We will refer her [facility] to perform MBS evaluation of her dysphagia.   She can complete her course of doxycycline but will likely need a course of augmentin given the concern for aspiration and parapneumonic effusion.  Follow up as scheduled with Dr. Ander Slade.  Freda Jackson, MD [facility] Office: [phone]     Current Outpatient Medications:    aspirin EC 81 MG tablet, Take 81 mg by mouth daily., Disp: , Rfl:    Biotin 5000 MCG TABS, Take 1 tablet by mouth daily. , Disp: , Rfl:    Calcium Carb-Cholecalciferol 600-5 MG-MCG TABS, 1 tablet with food, Disp: , Rfl:    doxycycline (VIBRAMYCIN) 100 MG capsule, Take 100 mg by mouth 2 (two) times daily., Disp: , Rfl:    ENBREL SURECLICK 50 MG/ML injection, Inject into the skin., Disp: , Rfl:    famotidine (PEPCID) 20 MG tablet, Take 20 mg by mouth 2 (two) times daily as needed., Disp: , Rfl:    methotrexate (RHEUMATREX) 2.5 MG tablet, Take 2.5 mg by mouth once a week. 10 tabs every Tuesday , Disp: , Rfl:    Multiple Vitamins-Minerals (MULTIVITAL) tablet, Take 1 tablet by mouth daily.  , Disp: , Rfl:    nitroGLYCERIN (NITROSTAT) 0.4 MG SL tablet, PLACE 1 TABLET (0.4 MG TOTAL) UNDER THE TONGUE EVERY 5 (FIVE) MINUTES AS NEEDED FOR CHEST PAIN., Disp: 25 tablet, Rfl: 3   pantoprazole (PROTONIX) 40 MG tablet, Take 1 tablet by mouth 2 (two) times daily., Disp: , Rfl:    SYMBICORT 160-4.5 MCG/ACT inhaler, TAKE 2 PUFFS BY MOUTH TWICE A DAY, Disp: 10.2 each, Rfl: 5   vitamin B-12 (CYANOCOBALAMIN) 1000 MCG tablet, Take 1,000 mcg by mouth daily., Disp: , Rfl:

## 2021-11-02 NOTE — Progress Notes (Signed)
? ?Synopsis: Referred in May 2023 for Acute visit ? ?Subjective:  ? ?PATIENT ID: Janice [name] GENDER: female DOB: [birth year], MRN: [id number] ? ? ?HPI ? ?Chief Complaint  ?Patient presents with  ? Acute Visit  ?  Increased SOB over the past 2-3 weeks. Recently had a CT at [facility] that showed pleural effusion. Non-productive cough.   ? ? ?Janice [name] is a 76 year old woman, former smoker with rheumatoid arthritis on methotrexate and enbrel and bronchiectasis who returns to pulmonary clinic for acute visit for progressive shortness of breath.  ? ?She has progressive dyspnea over the past [redacted] weeks along with occasional night sweats that require [name] to change [name] clothes/sheets. She denies fevers and chills. She complained of right sided flank pain and anterior chest pain that was worse with cough and deep inspiration. She had CT abdominal scan at [facility] that noted basilar infiltrates, bronchiectasis and loculated right pleural effusion. She was started on doxycycline which has helped improve [name] symptoms somewhat. She most recently had night sweats last night. She reports increase symptoms with trouble swallowing solids over the past month.  ? ?She has history of rheumatoid arthritis which tends to affect [name] hands mostly.  ? ?She has been on symbiocrt 160-4.64mg for [name] breathing.  ? ?She is accompanied by [name] daughter.  ? ? ?Past Medical History:  ?Diagnosis Date  ? Acute myocardial infarction, unspecified site, episode of care unspecified   ? Arthritis, rheumatoid (HCC)   ? hx  ? Back pain, chronic   ? Pulmonary embolism ([facility]   ? Scoliosis   ?  ? ?Family History  ?Problem Relation Age of Onset  ? Cancer Mother   ?  ? ?Social History  ? ?Socioeconomic History  ? Marital status: Married  ?  Spouse name: Not on file  ? Number of children: Not on file  ? Years of education: Not on file  ? Highest education level: Not on file  ?Occupational History  ? Not on file  ?Tobacco Use  ? Smoking status: Former  ?  Smokeless tobacco: Never  ? Tobacco comments:  ?  has 7 plus pack-year smoking history, but quit in 1981.   ?Substance and Sexual Activity  ? Alcohol use: Not Currently  ?  Comment: rare etoh   ? Drug use: No  ? Sexual activity: Not Currently  ?Other Topics Concern  ? Not on file  ?Social History Narrative  ? [name] She works full time in a [employer] doing nonexertional work. No herbal medications, regular diet. No regular exercise.   ? ?Social Determinants of Health  ? ?Financial Resource Strain: Not on file  ?Food Insecurity: Not on file  ?Transportation Needs: Not on file  ?Physical Activity: Not on file  ?Stress: Not on file  ?Social Connections: Not on file  ?Intimate Partner Violence: Not on file  ?  ? ?Allergies  ?Allergen Reactions  ? Erythromycin   ?  ? ?Outpatient Medications Prior to Visit  ?Medication Sig Dispense Refill  ? aspirin EC 81 MG tablet Take 81 mg by mouth daily.    ? Biotin 5000 MCG TABS Take 1 tablet by mouth daily.     ? Calcium Carb-Cholecalciferol 600-5 MG-MCG TABS 1 tablet with food    ? doxycycline (VIBRAMYCIN) 100 MG capsule Take 100 mg by mouth 2 (two) times daily.    ? ENBREL SURECLICK 50 MG/ML injection Inject into the skin.    ?  famotidine (PEPCID) 20 MG tablet Take 20 mg by mouth 2 (two) times daily as needed.    ? methotrexate (RHEUMATREX) 2.5 MG tablet Take 2.5 mg by mouth once a week. 10 tabs every Tuesday     ? Multiple Vitamins-Minerals (MULTIVITAL) tablet Take 1 tablet by mouth daily.      ? nitroGLYCERIN (NITROSTAT) 0.4 MG SL tablet PLACE 1 TABLET (0.4 MG TOTAL) UNDER THE TONGUE EVERY 5 (FIVE) MINUTES AS NEEDED FOR CHEST PAIN. 25 tablet 3  ? pantoprazole (PROTONIX) 40 MG tablet Take 1 tablet by mouth 2 (two) times daily.    ? SYMBICORT 160-4.5 MCG/ACT inhaler TAKE 2 PUFFS BY MOUTH TWICE A DAY 10.2 each 5  ? vitamin B-12 (CYANOCOBALAMIN) 1000 MCG tablet Take 1,000 mcg by mouth daily.    ? Zinc Sulfate (ZINC 15 PO) Take by mouth  in the morning, at noon, and at bedtime.    ? alendronate (FOSAMAX) 70 MG tablet alendronate 70 mg tablet (Patient not taking: Reported on 07/25/2021)    ? celecoxib (CELEBREX) 200 MG capsule TAKE 1 CAPSULE BY MOUTH EVERY DAY AS NEEDED FOR PAIN    ? celecoxib (CELEBREX) 200 MG capsule once daily as needed    ? Cholecalciferol (D3 PO) Take 3,000 mcg by mouth.    ? ciprofloxacin (CIPRO) 500 MG tablet Take 1 tablet (500 mg total) by mouth every 12 (twelve) hours. 10 tablet 0  ? ferrous sulfate 325 (65 FE) MG tablet 1 tablet    ? Folic Acid (FOLATE PO) Take 1 tablet by mouth daily.     ? HYDROcodone-homatropine (HYCODAN) 5-1.5 MG/5ML syrup Take 5 mLs by mouth every 8 (eight) hours as needed for cough. 60 mL 0  ? Menaquinone-7 (VITAMIN K2 PO) Take by mouth.    ? nitrofurantoin, macrocrystal-monohydrate, (MACROBID) 100 MG capsule Take 1 capsule (100 mg total) by mouth 2 (two) times daily. 10 capsule 0  ? predniSONE (DELTASONE) 5 MG tablet TAKE 1 TABLET BY MOUTH EVERY DAY WITH FOOD OR MILK    ? traMADol (ULTRAM) 50 MG tablet 1 tablet as needed    ? ?No facility-administered medications prior to visit.  ? ?Review of Systems  ?Constitutional:  Positive for diaphoresis. Negative for chills, fever, malaise/fatigue and weight loss.  ?HENT:  Negative for congestion, sinus pain and sore throat.   ?Eyes: Negative.   ?Respiratory:  Positive for shortness of breath. Negative for cough, hemoptysis, sputum production and wheezing.   ?Cardiovascular:  Positive for chest pain. Negative for palpitations, orthopnea, claudication and leg swelling.  ?Gastrointestinal:  Negative for abdominal pain, heartburn, nausea and vomiting.  ?Genitourinary: Negative.   ?Musculoskeletal:  Negative for joint pain and myalgias.  ?Skin:  Negative for rash.  ?Neurological:  Negative for weakness.  ?Endo/Heme/Allergies: Negative.   ?Psychiatric/Behavioral: Negative.    ? ?Objective:  ? ?Vitals:  ? 11/02/21 1052  ?BP: 118/72  ?Pulse: 88  ?Temp: 98.3 ?F (36.8  ?C)  ?TempSrc: Oral  ?SpO2: 96%  ?Weight: 181 lb 3.2 oz (82.2 kg)  ?Height: '5\' 6"'$  (1.676 m)  ? ?Physical Exam ?Constitutional:   ?   General: She is not in acute distress. ?   Appearance: She is not ill-appearing.  ?HENT:  ?   Head: Normocephalic and atraumatic.  ?Eyes:  ?   General: No scleral icterus. ?   Conjunctiva/sclera: Conjunctivae normal.  ?   Pupils: Pupils are equal, round, and reactive to light.  ?Cardiovascular:  ?   Rate and Rhythm: Normal rate and  regular rhythm.  ?   Pulses: Normal pulses.  ?   Heart sounds: Normal heart sounds. No murmur heard. ?Pulmonary:  ?   Effort: Pulmonary effort is normal.  ?   Breath sounds: Examination of the right-lower field reveals decreased breath sounds. Decreased breath sounds present. No wheezing, rhonchi or rales.  ?Abdominal:  ?   General: Bowel sounds are normal.  ?   Palpations: Abdomen is soft.  ?Musculoskeletal:  ?   Right lower leg: No edema.  ?   Left lower leg: No edema.  ?Lymphadenopathy:  ?   Cervical: No cervical adenopathy.  ?Skin: ?   General: Skin is warm and dry.  ?Neurological:  ?   General: No focal deficit present.  ?   Mental Status: She is alert.  ?Psychiatric:     ?   Mood and Affect: Mood normal.     ?   Behavior: Behavior normal.     ?   Thought Content: Thought content normal.     ?   Judgment: Judgment normal.  ? ? ?CBC ?   ?Component Value Date/Time  ? WBC 6.1 11/02/2021 1148  ? RBC 4.10 11/02/2021 1148  ? HGB 10.8 (L) 11/02/2021 1148  ? HCT 34.2 (L) 11/02/2021 1148  ? PLT 535.0 (H) 11/02/2021 1148  ? MCV 83.5 11/02/2021 1148  ? MCHC 31.6 11/02/2021 1148  ? RDW 19.7 (H) 11/02/2021 1148  ? LYMPHSABS 0.9 11/02/2021 1148  ? MONOABS 0.6 11/02/2021 1148  ? EOSABS 0.4 11/02/2021 1148  ? BASOSABS 0.0 11/02/2021 1148  ? ? ?  Latest Ref Rng & Units 11/02/2021  ? 11:48 AM 05/16/2009  ? 10:47 AM  ?BMP  ?Glucose 70 - 99 mg/dL 114   113    ?BUN 6 - 23 mg/dL 17   14    ?Creatinine 0.40 - 1.20 mg/dL 0.71   0.96    ?Sodium 135 - 145 mEq/L 138   145     ?Potassium 3.5 - 5.1 mEq/L 4.0   4.1    ?Chloride 96 - 112 mEq/L 102   110    ?CO2 19 - 32 mEq/L 25   28    ?Calcium 8.4 - 10.5 mg/dL 9.8   9.4    ? ?Chest imaging: ?CT Chest w/contrast 11/02/21 ?Mediastinum/No

## 2021-11-02 NOTE — Telephone Encounter (Signed)
Ok to switch 

## 2021-11-02 NOTE — Telephone Encounter (Signed)
Dr. Erin Fulling has been made aware of provider switch so he can put this on AVS. Nothing further needed. ?

## 2021-11-02 NOTE — Telephone Encounter (Signed)
Called and spoke with pt's daughter Sharyn Lull letting her know the process that has to be done prior to [place] able to schedule an appt for pt to see Dr. Jenetta Downer and she verbalized understanding. While speaking with Sharyn Lull, she stated that pt has been having more SOB and she feels like she needs to be seen so I have scheduled pt an acute visit with Dr. Erin Fulling at [facility] said if that ended up not working that she would call the office back to get it cancelled/rescheduled. ? ? ? ? ?Dr. Vaughan Browner, please advise if you are okay with pt switching to Dr. Jenetta Downer and Dr. Jenetta Downer, please advise if you are okay taking over pt's care. ?

## 2021-11-02 NOTE — Telephone Encounter (Signed)
Please let patient know her CT Chest scan shows small loculated right pleural effusion.  ? ?We can schedule her for ultrasound evaluation on Friday at [facility] where I will be working and if the effusion is safe to drain some fluid we will do so via thoracentesis. ? ?Risks of the procedure include bleeding, infection and pneumothorax (collapse of the lung).  ? ?If she would like to move forward with the thoracentesis, please have her scheduled on Friday afternoon at [place]. She is not on any blood thinners or antiplatelets. She does not need to be NPO. No anesthesia needed.  ? ?Thanks, ?Freda Jackson, MD ?[facility] ?Office: [phone] ? ? ?

## 2021-11-03 LAB — RHEUMATOID FACTOR: Rheumatoid fact SerPl-aCnc: 354 IU/mL — ABNORMAL HIGH (ref <14)

## 2021-11-03 LAB — CYCLIC CITRUL PEPTIDE ANTIBODY, IGG: Cyclic Citrullin Peptide Ab: >250 UNITS — ABNORMAL HIGH

## 2021-11-03 NOTE — Telephone Encounter (Signed)
If you can't go to [facility] tomorrow and it needs to be done tomorrow - [facility] can place order for thoracentesis and we can call [facility] and get it scheduled thru them for radiologist (or whoever does them when it's not one of our providers).

## 2021-11-03 NOTE — Telephone Encounter (Signed)
Called and spoke with patient. She verbalized understanding and wishes to proceed with the thoracentesis. She is aware that this will need to be done at [facility].    PCCs, can we get her scheduled for a thoracentesis tomorrow afternoon at [facility] with Dr. Erin Fulling?

## 2021-11-03 NOTE — Telephone Encounter (Signed)
If you are going to do it at [facility] yourself I believe I just need to call them like I did at [place] and they will set up without an order.  I don't know if you are planning to go over there yourself.  If someone else is going to do it then a formal order will need to be placed and [facility] will schedule off of it.

## 2021-11-03 NOTE — Telephone Encounter (Signed)
I called [facility] and spoke to [place].  She states all outpatient procedures have to be done at [facility].  Dr Erin Fulling do you want me to try to get it scheduled at [facility]?

## 2021-11-03 NOTE — Telephone Encounter (Signed)
Pt has been scheduled for tomorrow afternoon at [facility] per JD.  Spoke to pt & made her aware.

## 2021-11-04 ENCOUNTER — Encounter (HOSPITAL_COMMUNITY): Payer: Self-pay | Admitting: Pulmonary Disease

## 2021-11-04 ENCOUNTER — Telehealth: Payer: Self-pay | Admitting: Pulmonary Disease

## 2021-11-04 ENCOUNTER — Encounter (HOSPITAL_COMMUNITY)
Admission: RE | Disposition: A | Discharge status: Home / Self Care | Payer: Self-pay | Source: Home / Self Care | Attending: Pulmonary Disease

## 2021-11-04 ENCOUNTER — Ambulatory Visit (HOSPITAL_COMMUNITY)
Admission: RE | Admit: 2021-11-04 | Discharge: 2021-11-04 | Disposition: A | Discharge status: Home / Self Care | Payer: Medicare HMO | Attending: Pulmonary Disease | Admitting: Pulmonary Disease

## 2021-11-04 DIAGNOSIS — Z79899 Other long term (current) drug therapy: Secondary | ICD-10-CM | POA: Insufficient documentation

## 2021-11-04 DIAGNOSIS — Z538 Procedure and treatment not carried out for other reasons: Secondary | ICD-10-CM | POA: Diagnosis not present

## 2021-11-04 DIAGNOSIS — K449 Diaphragmatic hernia without obstruction or gangrene: Secondary | ICD-10-CM | POA: Diagnosis not present

## 2021-11-04 DIAGNOSIS — R1310 Dysphagia, unspecified: Secondary | ICD-10-CM | POA: Insufficient documentation

## 2021-11-04 DIAGNOSIS — Z87891 Personal history of nicotine dependence: Secondary | ICD-10-CM | POA: Diagnosis not present

## 2021-11-04 DIAGNOSIS — M069 Rheumatoid arthritis, unspecified: Secondary | ICD-10-CM | POA: Insufficient documentation

## 2021-11-04 DIAGNOSIS — R768 Other specified abnormal immunological findings in serum: Secondary | ICD-10-CM | POA: Insufficient documentation

## 2021-11-04 DIAGNOSIS — Z79631 Long term (current) use of antimetabolite agent: Secondary | ICD-10-CM | POA: Diagnosis not present

## 2021-11-04 DIAGNOSIS — J479 Bronchiectasis, uncomplicated: Secondary | ICD-10-CM | POA: Insufficient documentation

## 2021-11-04 DIAGNOSIS — J90 Pleural effusion, not elsewhere classified: Secondary | ICD-10-CM

## 2021-11-04 SURGERY — CANCELLED PROCEDURE

## 2021-11-04 MED ORDER — PREDNISONE 20 MG PO TABS: 40.0000 mg | ORAL_TABLET | Freq: Every day | ORAL | 0 refills | Status: AC

## 2021-11-04 MED ORDER — AMOXICILLIN-POT CLAVULANATE 875-125 MG PO TABS: 1.0000 | ORAL_TABLET | Freq: Two times a day (BID) | ORAL | 0 refills | Status: AC

## 2021-11-04 NOTE — Discharge Instructions (Signed)
-   Start prednisone '40mg'$  daily until follow up visit in 2 weeks - Start Augmentin 1 tab twice daily for 14 days  Please call our office with any further questions

## 2021-11-04 NOTE — Progress Notes (Signed)
Procedure canceled per Dr Erin Fulling. Patient assisted to car in care of family.

## 2021-11-04 NOTE — Op Note (Signed)
Chest Ultrasound Report: Right pleural space was evaluated via ultrasound. She has small complicated pleural effusion with loculations and septations present. There was no safe area to perform thoracentesis. Dr. Noemi Chapel evaluated imaging with me and agreed of difficult space to perform procedure. Please see image uploaded by Dr. Carlis Abbott.   Plan: - given patients elevated rheumatoid factor and CCP abs along with coming off her immunosuppressive therapy and increased joint symptoms of her hands I am concerned for rheumatoid flare involving her right pleural space. - We will treated with '40mg'$  of prednisone daily and schedule follow up in 2 weeks - Will treat with augmentin incase of parapneumonic process from pneumonia.   Discussed plan and reviewed [place] images with her family at bedside.  Freda Jackson, MD [facility] Office: [phone]   See Amion for personal pager PCCM on call pager [phone] until 7pm. Please call Elink 7p-7a. [phone]

## 2021-11-04 NOTE — Interval H&P Note (Signed)
History and Physical Interval Note:  11/04/2021 1:45 PM  Janice [name]  has presented today for surgery, with the diagnosis of pleural effusion.  The various methods of treatment have been discussed with the patient and family. After consideration of risks, benefits and other options for treatment, the patient has consented to  Procedure(s): THORACENTESIS (N/A) as a surgical intervention.  The patient's history has been reviewed, patient examined, no change in status, stable for surgery.  I have reviewed the patient's chart and labs.  Questions were answered to the patient's satisfaction.     Freddi Starr

## 2021-11-04 NOTE — Telephone Encounter (Signed)
Please schedule patient for follow up visit on 6/1 or 6/2 to monitor her treatment plan on prednisone.   I gave her all instructions today after ultrasound of her chest.   Please let her know date/time of follow up visit.  Thanks, Freda Jackson, MD [facility] Office: [phone]

## 2021-11-04 NOTE — Telephone Encounter (Signed)
Called patient and got her scheduled for follow up on 11/21/2021 at 9am. This was Dr Lisabeth Pick next available appointment. Nothing further needed

## 2021-11-05 LAB — ANA W/REFLEX: ANA Titer 1: POSITIVE — AB

## 2021-11-05 LAB — ENA+DNA/DS+ANTICH+CENTRO+FA...
Anti JO-1: <0.2 AI (ref 0.0–0.9)
Antiribosomal P Antibodies: <0.2 AI (ref 0.0–0.9)
Centromere Ab Screen: <0.2 AI (ref 0.0–0.9)
Chromatin Ab SerPl-aCnc: 0.9 AI (ref 0.0–0.9)
ENA RNP Ab: 0.2 AI (ref 0.0–0.9)
ENA SM Ab Ser-aCnc: <0.2 AI (ref 0.0–0.9)
ENA SSA (RO) Ab: 0.2 AI (ref 0.0–0.9)
ENA SSB (LA) Ab: <0.2 AI (ref 0.0–0.9)
Scleroderma (Scl-70) (ENA) Antibody, IgG: <0.2 AI (ref 0.0–0.9)
Smith/RNP Antibodies: 0.2 AI (ref 0.0–0.9)
Speckled Pattern: 1:320 {titer} — ABNORMAL HIGH
dsDNA Ab: <1 IU/mL (ref 0–9)

## 2021-11-07 DIAGNOSIS — M5136 Other intervertebral disc degeneration, lumbar region: Secondary | ICD-10-CM | POA: Diagnosis not present

## 2021-11-07 DIAGNOSIS — Z79899 Other long term (current) drug therapy: Secondary | ICD-10-CM | POA: Diagnosis not present

## 2021-11-07 DIAGNOSIS — D649 Anemia, unspecified: Secondary | ICD-10-CM | POA: Diagnosis not present

## 2021-11-07 DIAGNOSIS — M0589 Other rheumatoid arthritis with rheumatoid factor of multiple sites: Secondary | ICD-10-CM | POA: Diagnosis not present

## 2021-11-07 DIAGNOSIS — M150 Primary generalized (osteo)arthritis: Secondary | ICD-10-CM | POA: Diagnosis not present

## 2021-11-07 DIAGNOSIS — I2510 Atherosclerotic heart disease of native coronary artery without angina pectoris: Secondary | ICD-10-CM | POA: Diagnosis not present

## 2021-11-07 DIAGNOSIS — M810 Age-related osteoporosis without current pathological fracture: Secondary | ICD-10-CM | POA: Diagnosis not present

## 2021-11-07 DIAGNOSIS — M0630 Rheumatoid nodule, unspecified site: Secondary | ICD-10-CM | POA: Diagnosis not present

## 2021-11-07 DIAGNOSIS — Z86711 Personal history of pulmonary embolism: Secondary | ICD-10-CM | POA: Diagnosis not present

## 2021-11-08 ENCOUNTER — Ambulatory Visit: Payer: Medicare HMO | Admitting: Obstetrics and Gynecology

## 2021-11-09 ENCOUNTER — Telehealth: Payer: Self-pay | Admitting: Urology

## 2021-11-09 DIAGNOSIS — N952 Postmenopausal atrophic vaginitis: Secondary | ICD-10-CM | POA: Diagnosis not present

## 2021-11-09 DIAGNOSIS — Z4689 Encounter for fitting and adjustment of other specified devices: Secondary | ICD-10-CM | POA: Diagnosis not present

## 2021-11-09 DIAGNOSIS — R338 Other retention of urine: Secondary | ICD-10-CM | POA: Diagnosis not present

## 2021-11-09 DIAGNOSIS — N813 Complete uterovaginal prolapse: Secondary | ICD-10-CM | POA: Diagnosis not present

## 2021-11-09 DIAGNOSIS — N3946 Mixed incontinence: Secondary | ICD-10-CM | POA: Diagnosis not present

## 2021-11-09 NOTE — Telephone Encounter (Signed)
DOS - 12/08/21   DOUBLE OSTETOTOMY LEFT --- 72091 METATARSAL OSTEOTOMY 2,5 LEFT --- 98022 CAPSULOTOMY 2 LEFT --- 17981 HAMMERTOE REPAIR 2 LEFT --- 02548     [employer] EFFECTIVE DATE - 06/19/20   SPOKE WITH MAX M. WITH [employer] AND SHE STATED THAT FOR CPT CODES 62824, 28308, 17530 AND  10404 NO PRIOR AUTH IS REQUIRED.    REF # [id number]

## 2021-11-18 DIAGNOSIS — R1310 Dysphagia, unspecified: Secondary | ICD-10-CM | POA: Diagnosis not present

## 2021-11-21 ENCOUNTER — Ambulatory Visit: Payer: Medicare HMO | Admitting: Pulmonary Disease

## 2021-11-21 ENCOUNTER — Ambulatory Visit (INDEPENDENT_AMBULATORY_CARE_PROVIDER_SITE_OTHER): Payer: Medicare HMO

## 2021-11-21 ENCOUNTER — Encounter: Payer: Self-pay | Admitting: Pulmonary Disease

## 2021-11-21 ENCOUNTER — Encounter: Payer: Self-pay | Admitting: Podiatry

## 2021-11-21 VITALS — BP 124/72 | HR 100 | Ht 66.0 in | Wt 183.6 lb

## 2021-11-21 DIAGNOSIS — Z792 Long term (current) use of antibiotics: Secondary | ICD-10-CM | POA: Diagnosis not present

## 2021-11-21 DIAGNOSIS — J90 Pleural effusion, not elsewhere classified: Secondary | ICD-10-CM

## 2021-11-21 DIAGNOSIS — J479 Bronchiectasis, uncomplicated: Secondary | ICD-10-CM

## 2021-11-21 DIAGNOSIS — J9811 Atelectasis: Secondary | ICD-10-CM | POA: Diagnosis not present

## 2021-11-21 DIAGNOSIS — R0602 Shortness of breath: Secondary | ICD-10-CM | POA: Diagnosis not present

## 2021-11-21 DIAGNOSIS — R059 Cough, unspecified: Secondary | ICD-10-CM | POA: Diagnosis not present

## 2021-11-21 MED ORDER — SULFAMETHOXAZOLE-TRIMETHOPRIM 800-160 MG PO TABS: 1.0000 | ORAL_TABLET | ORAL | 0 refills | Status: DC

## 2021-11-21 MED ORDER — IPRATROPIUM-ALBUTEROL 0.5-2.5 (3) MG/3ML IN SOLN: 3.0000 mL | Freq: Four times a day (QID) | RESPIRATORY_TRACT | 1 refills | Status: DC | PRN

## 2021-11-21 NOTE — Progress Notes (Signed)
Synopsis: Referred in May 2023 for Acute visit  Subjective:   PATIENT ID: Janice [name] GENDER: female DOB: October 07, 1945, MRN: [id number]  HPI  Chief Complaint  Patient presents with   Follow-up    F/U after starting prednisone. Has finished the abx. Denies any chest pain.     Janice [name] is a 76 year old woman, former smoker with rheumatoid arthritis on methotrexate and enbrel and bronchiectasis who returns to pulmonary clinic for follow up of pleural effusion.   She was scheduled for thoracentesis 11/04/21 but based on pleural [place], the effusion was very small and complicated with loculations and septations. Dr. Noemi Chapel viewed the pleural space as well and deemed it too risky for procedure so no thoracentesis was performed.  She was started on '40mg'$  of prednisone for concern of rheuamtoid associated pleural effusion given she had been off [name] rheumatoid arthritis treatment. She was also treated with augmentin for concern of possible parapneumonic effusion in setting of aspiration. MBS was performed at [facility].   Labs 5/17 showed hemoglobin 10.8g/dL, peripheral eosinophil count of 400, ANA 1:320 speckled pattern, CCP >250 and RF 354.   She reports resolution of the right sided chest pain and night sweats since completing 14 day course of augmentin and starting '40mg'$  of prednisone daily. She has resumed [name] methotrexate and enbrel for [name] RA. [name] joint pains are much better.   She now has increased cough over the past 4-5 days and shortness of breath.  She remains on symbicort 160-4.67mg 2 puffs twice daily.   Initial OV 11/02/21 She has progressive dyspnea over the past [redacted] weeks along with occasional night sweats that require [name] to change [name] clothes/sheets. She denies fevers and chills. She complained of right sided flank pain and anterior chest pain that was worse with cough and deep inspiration. [facility],  bronchiectasis and loculated right pleural effusion. She was started on doxycycline which has helped improve [name] symptoms somewhat. She most recently had night sweats last night. She reports increase symptoms with trouble swallowing solids over the past month.   She has history of rheumatoid arthritis which tends to affect [name] hands mostly.   She has been on symbiocrt 160-4.595m for [name] breathing.   She is accompanied by [name] daughter.    Past Medical History:  Diagnosis Date   Acute myocardial infarction, unspecified site, episode of care unspecified    Arthritis, rheumatoid (HCC)    hx   Back pain, chronic    Pulmonary embolism (HCC)    Scoliosis      Family History  Problem Relation Age of Onset   Cancer Mother      Social History   Socioeconomic History   Marital status: Married    Spouse name: Not on file   Number of children: Not on file   Years of education: Not on file   Highest education level: Not on file  Occupational History   Not on file  Tobacco Use   Smoking status: Former   Smokeless tobacco: Never   Tobacco comments:    has 7 plus pack-year smoking history, but quit in 1981.   Substance and Sexual Activity   Alcohol use: Not Currently    Comment: rare etoh    Drug use: No   Sexual activity: Not Currently  Other Topics Concern   Not on file  Social History Narrative   L[place] in [name].  She works full time in a [employer], doing nonexertional work. No herbal medications, regular diet. No regular exercise.    Social Determinants of Health   Financial Resource Strain: Not on file  Food Insecurity: Not on file  Transportation Needs: Not on file  Physical Activity: Not on file  Stress: Not on file  Social Connections: Not on file  Intimate Partner Violence: Not on file     Allergies  Allergen Reactions   Erythromycin      Outpatient Medications Prior to Visit  Medication Sig Dispense Refill   ALPRAZolam  (XANAX) 0.5 MG tablet Take 0.5 mg by mouth daily as needed for anxiety.     aspirin EC 81 MG tablet Take 81 mg by mouth daily.     Biotin 5000 MCG TABS Take 5,000 mcg by mouth daily.     Cholecalciferol (VITAMIN D3 PO) Take 1 capsule by mouth daily.     ENBREL SURECLICK 50 MG/ML injection Inject 50 mg into the skin once a week.     famotidine (PEPCID) 20 MG tablet Take 20 mg by mouth daily.     methotrexate (RHEUMATREX) 2.5 MG tablet Take 20 mg by mouth once a week.     Multiple Vitamins-Minerals (MULTIVITAL) tablet Take 1 tablet by mouth daily.       nitroGLYCERIN (NITROSTAT) 0.4 MG SL tablet PLACE 1 TABLET (0.4 MG TOTAL) UNDER THE TONGUE EVERY 5 (FIVE) MINUTES AS NEEDED FOR CHEST PAIN. 25 tablet 3   pantoprazole (PROTONIX) 40 MG tablet Take 1 tablet by mouth daily.     predniSONE (DELTASONE) 20 MG tablet Take 2 tablets (40 mg total) by mouth daily with breakfast. 60 tablet 0   SYMBICORT 160-4.5 MCG/ACT inhaler TAKE 2 PUFFS BY MOUTH TWICE A DAY 10.2 each 5   vitamin B-12 (CYANOCOBALAMIN) 1000 MCG tablet Take 1,000 mcg by mouth daily.     No facility-administered medications prior to visit.   Review of Systems  Constitutional:  Positive for diaphoresis. Negative for chills, fever, malaise/fatigue and weight loss.  HENT:  Negative for congestion, sinus pain and sore throat.   Eyes: Negative.   Respiratory:  Positive for shortness of breath. Negative for cough, hemoptysis, sputum production and wheezing.   Cardiovascular:  Positive for chest pain. Negative for palpitations, orthopnea, claudication and leg swelling.  Gastrointestinal:  Negative for abdominal pain, heartburn, nausea and vomiting.  Genitourinary: Negative.   Musculoskeletal:  Negative for joint pain and myalgias.  Skin:  Negative for rash.  Neurological:  Negative for weakness.  Endo/Heme/Allergies: Negative.   Psychiatric/Behavioral: Negative.     Objective:   Vitals:   11/21/21 0853  BP: 124/72  Pulse: 100  SpO2: 97%   Weight: 183 lb 9.6 oz (83.3 kg)  Height: '5\' 6"'$  (1.676 m)    Physical Exam Constitutional:      General: She is not in acute distress.    Appearance: She is not ill-appearing.  HENT:     Head: Normocephalic and atraumatic.  Eyes:     General: No scleral icterus.    Conjunctiva/sclera: Conjunctivae normal.  Cardiovascular:     Rate and Rhythm: Normal rate and regular rhythm.     Pulses: Normal pulses.     Heart sounds: Normal heart sounds. No murmur heard. Pulmonary:     Effort: Pulmonary effort is normal.     Breath sounds: Examination of the right-lower field reveals decreased breath sounds. Decreased breath sounds present. No wheezing, rhonchi or rales.  Musculoskeletal:  Right lower leg: No edema.     Left lower leg: No edema.  Skin:    General: Skin is warm and dry.  Neurological:     General: No focal deficit present.     Mental Status: She is alert.  Psychiatric:        Mood and Affect: Mood normal.        Behavior: Behavior normal.        Thought Content: Thought content normal.        Judgment: Judgment normal.    CBC    Component Value Date/Time   WBC 6.1 11/02/2021 1148   RBC 4.10 11/02/2021 1148   HGB 10.8 (L) 11/02/2021 1148   HCT 34.2 (L) 11/02/2021 1148   PLT 535.0 (H) 11/02/2021 1148   MCV 83.5 11/02/2021 1148   MCHC 31.6 11/02/2021 1148   RDW 19.7 (H) 11/02/2021 1148   LYMPHSABS 0.9 11/02/2021 1148   MONOABS 0.6 11/02/2021 1148   EOSABS 0.4 11/02/2021 1148   BASOSABS 0.0 11/02/2021 1148      Latest Ref Rng & Units 11/02/2021   11:48 AM 05/16/2009   10:47 AM  BMP  Glucose 70 - 99 mg/dL 114   113    BUN 6 - 23 mg/dL 17   14    Creatinine 0.40 - 1.20 mg/dL 0.71   0.96    Sodium 135 - 145 mEq/L 138   145    Potassium 3.5 - 5.1 mEq/L 4.0   4.1    Chloride 96 - 112 mEq/L 102   110    CO2 19 - 32 mEq/L 25   28    Calcium 8.4 - 10.5 mg/dL 9.8   9.4     Chest imaging: CXR 11/21/21 Small right pleural effusion without significant change from  prior chest CT. This minimally tracks into the minor fissure, unchanged. Subsegmental linear opacities at the right lung base. No new or progressive airspace disease. The heart is normal in size. Retrocardiac hiatal hernia. No pulmonary edema. No pneumothorax. Mild thoracic scoliosis and spondylosis.  CT Chest w/contrast 11/02/21 Mediastinum/Nodes: No enlarged mediastinal, hilar, or axillary lymph nodes. There is a moderate hiatal hernia. Thyroid gland, trachea, and esophagus demonstrate no significant findings.   Lungs/Pleura: There is a small loculated right pleural effusion on the right with fluid both anteriorly and posteriorly. There is mild associated atelectasis with no significant pleural thickening or pulmonary mass. There is no pleural effusion on the left and the left lung is clear. There is no pneumothorax on either side. Bilateral bronchiectasis with a lower lung predominance is noted.  PFT:     View : No data to display.          Labs:  Path:  Echo:  Heart Catheterization:  Assessment & Plan:   Loculated pleural effusion - Plan: DG Chest 2 View  Bronchiectasis without complication (HCC)  SOB (shortness of breath) - Plan: DG Chest 2 View  Prophylactic antibiotic - Plan: sulfamethoxazole-trimethoprim (BACTRIM DS) 800-160 MG tablet  Discussion: Janice [name] is a 75 year old woman, former smoker with rheumatoid arthritis on methotrexate and enbrel and bronchiectasis who returns to pulmonary clinic for follow up of pleural effusion.   She has improved right sided chest pain and night sweats since augmentin course. The pleural effusion remains stable on chest radiograph today. No other findings on chest radiograph to explain [name] increased cough. We are awaiting MBS results from [facility]. We will start [name] on duoneb nebulizer treatments  followed by flutter valve to treat [name] bronchiectasis and the new cough.   We will continue prednisone taper along  with bactrim prophylaxis antibiotics.   Prednisone taper: '40mg'$  daily through 6/16 '30mg'$  daily 6/17 to 7/1 '20mg'$  daily 7/2 to 7/16 '10mg'$  daily 7/17 to 7/31 '5mg'$  daily 8/1 to 8/15  Continue bactrim antibiotic M-W-F until 7/17   Follow up in 1 month.  Freda Jackson, MD [facility] Office: [phone]     Current Outpatient Medications:    ALPRAZolam (XANAX) 0.5 MG tablet, Take 0.5 mg by mouth daily as needed for anxiety., Disp: , Rfl:    aspirin EC 81 MG tablet, Take 81 mg by mouth daily., Disp: , Rfl:    Biotin 5000 MCG TABS, Take 5,000 mcg by mouth daily., Disp: , Rfl:    Cholecalciferol (VITAMIN D3 PO), Take 1 capsule by mouth daily., Disp: , Rfl:    ENBREL SURECLICK 50 MG/ML injection, Inject 50 mg into the skin once a week., Disp: , Rfl:    famotidine (PEPCID) 20 MG tablet, Take 20 mg by mouth daily., Disp: , Rfl:    methotrexate (RHEUMATREX) 2.5 MG tablet, Take 20 mg by mouth once a week., Disp: , Rfl:    Multiple Vitamins-Minerals (MULTIVITAL) tablet, Take 1 tablet by mouth daily.  , Disp: , Rfl:    nitroGLYCERIN (NITROSTAT) 0.4 MG SL tablet, PLACE 1 TABLET (0.4 MG TOTAL) UNDER THE TONGUE EVERY 5 (FIVE) MINUTES AS NEEDED FOR CHEST PAIN., Disp: 25 tablet, Rfl: 3   pantoprazole (PROTONIX) 40 MG tablet, Take 1 tablet by mouth daily., Disp: , Rfl:    predniSONE (DELTASONE) 20 MG tablet, Take 2 tablets (40 mg total) by mouth daily with breakfast., Disp: 60 tablet, Rfl: 0   sulfamethoxazole-trimethoprim (BACTRIM DS) 800-160 MG tablet, Take 1 tablet by mouth 3 (three) times a week., Disp: 16 tablet, Rfl: 0   SYMBICORT 160-4.5 MCG/ACT inhaler, TAKE 2 PUFFS BY MOUTH TWICE A DAY, Disp: 10.2 each, Rfl: 5   vitamin B-12 (CYANOCOBALAMIN) 1000 MCG tablet, Take 1,000 mcg by mouth daily., Disp: , Rfl:

## 2021-11-21 NOTE — Patient Instructions (Addendum)
Your chest x-ray shows persistent small right pleural effusion. We will await final radiologist read.  We will start you on duoneb nebulizer treatments 2-3 times per day followed by flutter valve twice daily.   Prednisone taper: '40mg'$  daily through 6/16 '30mg'$  daily 6/17 to 7/1 '20mg'$  daily 7/2 to 7/16 '10mg'$  daily 7/17 to 7/31 '5mg'$  daily 8/1 to 8/15  Continue bactrim antibiotic M-W-F until 7/17  Discuss with your rheumatologist about stopping or plan to come of methotrexate.  Follow up in 4 weeks

## 2021-11-22 DIAGNOSIS — J479 Bronchiectasis, uncomplicated: Secondary | ICD-10-CM | POA: Diagnosis not present

## 2021-11-24 ENCOUNTER — Encounter: Payer: Self-pay | Admitting: Pulmonary Disease

## 2021-12-01 ENCOUNTER — Encounter: Payer: Self-pay | Admitting: Pulmonary Disease

## 2021-12-02 MED ORDER — PREDNISONE 10 MG PO TABS: ORAL_TABLET | ORAL | 0 refills | Status: AC

## 2021-12-14 ENCOUNTER — Encounter: Payer: Medicare HMO | Admitting: Podiatry

## 2021-12-14 DIAGNOSIS — N3941 Urge incontinence: Secondary | ICD-10-CM | POA: Diagnosis not present

## 2021-12-14 DIAGNOSIS — R339 Retention of urine, unspecified: Secondary | ICD-10-CM | POA: Diagnosis not present

## 2021-12-14 DIAGNOSIS — N952 Postmenopausal atrophic vaginitis: Secondary | ICD-10-CM | POA: Diagnosis not present

## 2021-12-14 DIAGNOSIS — N813 Complete uterovaginal prolapse: Secondary | ICD-10-CM | POA: Diagnosis not present

## 2021-12-14 DIAGNOSIS — Z4689 Encounter for fitting and adjustment of other specified devices: Secondary | ICD-10-CM | POA: Diagnosis not present

## 2021-12-14 DIAGNOSIS — N393 Stress incontinence (female) (male): Secondary | ICD-10-CM | POA: Diagnosis not present

## 2021-12-22 DIAGNOSIS — N393 Stress incontinence (female) (male): Secondary | ICD-10-CM | POA: Diagnosis not present

## 2021-12-28 ENCOUNTER — Encounter: Payer: Medicare HMO | Admitting: Podiatry

## 2021-12-30 ENCOUNTER — Ambulatory Visit: Payer: Medicare HMO | Admitting: Pulmonary Disease

## 2021-12-30 ENCOUNTER — Encounter: Payer: Self-pay | Admitting: Pulmonary Disease

## 2021-12-30 VITALS — BP 116/82 | HR 70 | Ht 66.0 in | Wt 188.0 lb

## 2021-12-30 DIAGNOSIS — J90 Pleural effusion, not elsewhere classified: Secondary | ICD-10-CM | POA: Diagnosis not present

## 2021-12-30 DIAGNOSIS — J479 Bronchiectasis, uncomplicated: Secondary | ICD-10-CM | POA: Diagnosis not present

## 2021-12-30 DIAGNOSIS — R1312 Dysphagia, oropharyngeal phase: Secondary | ICD-10-CM

## 2021-12-30 DIAGNOSIS — N813 Complete uterovaginal prolapse: Secondary | ICD-10-CM | POA: Diagnosis not present

## 2021-12-30 NOTE — Patient Instructions (Addendum)
You have mild oropharyngeal dysphagia based on your swallow study.   Prednisone Taper: '20mg'$  daily 7/2 to 7/16 '10mg'$  daily 7/17 to 7/31 '5mg'$  daily 8/1 to 8/15  Follow up in 2 months

## 2021-12-30 NOTE — Progress Notes (Unsigned)
Synopsis: Referred in May 2023 for Acute visit  Subjective:   PATIENT ID: Janice [name] Her GENDER: female DOB: 08/28/1945, MRN: [id number]  HPI  Chief Complaint  Patient presents with   Follow-up    1 mo f/u, states she has been doing well since last visit.    Janice [name] is a 76 year old woman, former smoker with rheumatoid arthritis on methotrexate and enbrel and bronchiectasis who returns to pulmonary clinic for follow up of pleural effusion and bronchiectasis.   She was tapered down from '40mg'$  daily at last visit to '20mg'$  daily. She has been on bactrim 3 times weekly. She was started on duoneb treatments and flutter valve therapy for her bronchiectasis.   She reports her right sided pain has resolved and she denies cough, sputum production, fevers or night sweats..  She is followed by Dr. Dossie Der of Rheumatology. She has resumed enbrel and denies any joint aches at this time.   She had MBS performed at [facility] with evidence of mild oropharyngeal dysphagia.   OV 11/21/21 She was scheduled for thoracentesis 11/04/21 but based on pleural [place], the effusion was very small and complicated with loculations and septations. Dr. Noemi Chapel viewed the pleural space as well and deemed it too risky for procedure so no thoracentesis was performed.  She was started on '40mg'$  of prednisone for concern of rheuamtoid associated pleural effusion given she had been off her rheumatoid arthritis treatment. She was also treated with augmentin for concern of possible parapneumonic effusion in setting of aspiration. MBS was performed at [facility].   Labs 5/17 showed hemoglobin 10.8g/dL, peripheral eosinophil count of 400, ANA 1:320 speckled pattern, CCP >250 and RF 354.   She reports resolution of the right sided chest pain and night sweats since completing 14 day course of augmentin and starting '40mg'$  of prednisone daily. She has resumed her methotrexate and enbrel for her RA. Her joint pains  are much better.   She now has increased cough over the past 4-5 days and shortness of breath.  She remains on symbicort 160-4.16mg 2 puffs twice daily.   Initial OV 11/02/21 She has progressive dyspnea over the past [redacted] weeks along with occasional night sweats that require her to change her clothes/sheets. She denies fevers and chills. She complained of right sided flank pain and anterior chest pain that was worse with cough and deep inspiration. [facility], bronchiectasis and loculated right pleural effusion. She was started on doxycycline which has helped improve her symptoms somewhat. She most recently had night sweats last night. She reports increase symptoms with trouble swallowing solids over the past month.   She has history of rheumatoid arthritis which tends to affect her hands mostly.   She has been on symbiocrt 160-4.542m for her breathing.   She is accompanied by her daughter.    Past Medical History:  Diagnosis Date   Acute myocardial infarction, unspecified site, episode of care unspecified    Arthritis, rheumatoid (HCC)    hx   Back pain, chronic    Pulmonary embolism (HCC)    Scoliosis      Family History  Problem Relation Age of Onset   Cancer Mother      Social History   Socioeconomic History   Marital status: Married    Spouse name: Not on file   Number of children: Not on file   Years of education: Not on file  Highest education level: Not on file  Occupational History   Not on file  Tobacco Use   Smoking status: Former   Smokeless tobacco: Never   Tobacco comments:    has 7 plus pack-year smoking history, but quit in 1981.   Substance and Sexual Activity   Alcohol use: Not Currently    Comment: rare etoh    Drug use: No   Sexual activity: Not Currently  Other Topics Concern   Not on file  Social History Narrative   Lives in [place] with her daughter. She works full time in a  [employer], doing nonexertional work. No herbal medications, regular diet. No regular exercise.    Social Determinants of Health   Financial Resource Strain: Not on file  Food Insecurity: Not on file  Transportation Needs: Not on file  Physical Activity: Not on file  Stress: Not on file  Social Connections: Not on file  Intimate Partner Violence: Not on file     Allergies  Allergen Reactions   Erythromycin      Outpatient Medications Prior to Visit  Medication Sig Dispense Refill   ALPRAZolam (XANAX) 0.5 MG tablet Take 0.5 mg by mouth daily as needed for anxiety.     aspirin EC 81 MG tablet Take 81 mg by mouth daily.     Biotin 5000 MCG TABS Take 5,000 mcg by mouth daily.     Cholecalciferol (VITAMIN D3 PO) Take 1 capsule by mouth daily.     ENBREL SURECLICK 50 MG/ML injection Inject 50 mg into the skin once a week.     famotidine (PEPCID) 20 MG tablet Take 20 mg by mouth daily.     ipratropium-albuterol (DUONEB) 0.5-2.5 (3) MG/3ML SOLN Take 3 mLs by nebulization every 6 (six) hours as needed. 1080 mL 1   methotrexate (RHEUMATREX) 2.5 MG tablet Take 20 mg by mouth once a week.     Multiple Vitamins-Minerals (MULTIVITAL) tablet Take 1 tablet by mouth daily.       nitroGLYCERIN (NITROSTAT) 0.4 MG SL tablet PLACE 1 TABLET (0.4 MG TOTAL) UNDER THE TONGUE EVERY 5 (FIVE) MINUTES AS NEEDED FOR CHEST PAIN. 25 tablet 3   pantoprazole (PROTONIX) 40 MG tablet Take 1 tablet by mouth daily.     predniSONE (DELTASONE) 10 MG tablet Take 3 tablets (30 mg total) by mouth daily with breakfast for 15 days, THEN 2 tablets (20 mg total) daily with breakfast for 15 days, THEN 1 tablet (10 mg total) daily with breakfast for 15 days, THEN 0.5 tablets (5 mg total) daily with breakfast for 15 days. 97.5 tablet 0   sulfamethoxazole-trimethoprim (BACTRIM DS) 800-160 MG tablet Take 1 tablet by mouth 3 (three) times a week. 16 tablet 0   SYMBICORT 160-4.5 MCG/ACT inhaler TAKE 2 PUFFS BY MOUTH TWICE  A DAY 10.2 each 5   vitamin B-12 (CYANOCOBALAMIN) 1000 MCG tablet Take 1,000 mcg by mouth daily.     No facility-administered medications prior to visit.   Review of Systems  Constitutional:  Negative for chills, diaphoresis, fever, malaise/fatigue and weight loss.  HENT:  Negative for congestion, sinus pain and sore throat.   Eyes: Negative.   Respiratory:  Negative for cough, hemoptysis, sputum production, shortness of breath and wheezing.   Cardiovascular:  Negative for chest pain, palpitations, orthopnea, claudication and leg swelling.  Gastrointestinal:  Negative for abdominal pain, heartburn, nausea and vomiting.  Genitourinary: Negative.   Musculoskeletal:  Negative for joint pain and myalgias.  Skin:  Negative for  rash.  Neurological:  Negative for weakness.  Endo/Heme/Allergies: Negative.   Psychiatric/Behavioral: Negative.      Objective:   Vitals:   12/30/21 1123  BP: 116/82  Pulse: 70  SpO2: 97%  Weight: 188 lb (85.3 kg)  Height: '5\' 6"'$  (1.676 m)   Physical Exam Constitutional:      General: She is not in acute distress.    Appearance: She is not ill-appearing.  HENT:     Head: Normocephalic and atraumatic.  Eyes:     General: No scleral icterus.    Conjunctiva/sclera: Conjunctivae normal.  Cardiovascular:     Rate and Rhythm: Normal rate and regular rhythm.     Pulses: Normal pulses.     Heart sounds: Normal heart sounds. No murmur heard. Pulmonary:     Effort: Pulmonary effort is normal.     Breath sounds: No wheezing, rhonchi or rales.  Musculoskeletal:     Right lower leg: No edema.     Left lower leg: No edema.  Skin:    General: Skin is warm and dry.  Neurological:     General: No focal deficit present.     Mental Status: She is alert.  Psychiatric:        Mood and Affect: Mood normal.        Behavior: Behavior normal.        Thought Content: Thought content normal.        Judgment: Judgment normal.    CBC    Component Value Date/Time    WBC 6.1 11/02/2021 1148   RBC 4.10 11/02/2021 1148   HGB 10.8 (L) 11/02/2021 1148   HCT 34.2 (L) 11/02/2021 1148   PLT 535.0 (H) 11/02/2021 1148   MCV 83.5 11/02/2021 1148   MCHC 31.6 11/02/2021 1148   RDW 19.7 (H) 11/02/2021 1148   LYMPHSABS 0.9 11/02/2021 1148   MONOABS 0.6 11/02/2021 1148   EOSABS 0.4 11/02/2021 1148   BASOSABS 0.0 11/02/2021 1148      Latest Ref Rng & Units 11/02/2021   11:48 AM 05/16/2009   10:47 AM  BMP  Glucose 70 - 99 mg/dL 114  113   BUN 6 - 23 mg/dL 17  14   Creatinine 0.40 - 1.20 mg/dL 0.71  0.96   Sodium 135 - 145 mEq/L 138  145   Potassium 3.5 - 5.1 mEq/L 4.0  4.1   Chloride 96 - 112 mEq/L 102  110   CO2 19 - 32 mEq/L 25  28   Calcium 8.4 - 10.5 mg/dL 9.8  9.4    Chest imaging: CXR 11/21/21 Small right pleural effusion without significant change from prior chest CT. This minimally tracks into the minor fissure, unchanged. Subsegmental linear opacities at the right lung base. No new or progressive airspace disease. The heart is normal in size. Retrocardiac hiatal hernia. No pulmonary edema. No pneumothorax. Mild thoracic scoliosis and spondylosis.  CT Chest w/contrast 11/02/21 Mediastinum/Nodes: No enlarged mediastinal, hilar, or axillary lymph nodes. There is a moderate hiatal hernia. Thyroid gland, trachea, and esophagus demonstrate no significant findings.   Lungs/Pleura: There is a small loculated right pleural effusion on the right with fluid both anteriorly and posteriorly. There is mild associated atelectasis with no significant pleural thickening or pulmonary mass. There is no pleural effusion on the left and the left lung is clear. There is no pneumothorax on either side. Bilateral bronchiectasis with a lower lung predominance is noted.  PFT:     No data to display  Labs:  Path:  Echo:  Heart Catheterization:  Assessment & Plan:   No diagnosis found.  Discussion: Janice [name] is a 76 year old woman,  former smoker with rheumatoid arthritis on methotrexate and enbrel and bronchiectasis who returns to pulmonary clinic for follow up of pleural effusion.   Her symptoms of right sided chest pain, night sweats and cough have resolved with extended course of augmentin and prolonged steroid taper. Her MBS did show mild oropharyngeal dysphagia so aspiration could have lead to parapneumonic effusion vs rheumatoid arthritis effusion.  She will continue on prednisone taper as below: '20mg'$  daily 7/2 to 7/16 '10mg'$  daily 7/17 to 7/31 '5mg'$  daily 8/1 to 8/15  Continue bactrim antibiotic M-W-F until 7/17  Follow up in 2 months.  Freda Jackson, MD [facility] Office: [phone]     Current Outpatient Medications:    ALPRAZolam (XANAX) 0.5 MG tablet, Take 0.5 mg by mouth daily as needed for anxiety., Disp: , Rfl:    aspirin EC 81 MG tablet, Take 81 mg by mouth daily., Disp: , Rfl:    Biotin 5000 MCG TABS, Take 5,000 mcg by mouth daily., Disp: , Rfl:    Cholecalciferol (VITAMIN D3 PO), Take 1 capsule by mouth daily., Disp: , Rfl:    ENBREL SURECLICK 50 MG/ML injection, Inject 50 mg into the skin once a week., Disp: , Rfl:    famotidine (PEPCID) 20 MG tablet, Take 20 mg by mouth daily., Disp: , Rfl:    ipratropium-albuterol (DUONEB) 0.5-2.5 (3) MG/3ML SOLN, Take 3 mLs by nebulization every 6 (six) hours as needed., Disp: 1080 mL, Rfl: 1   methotrexate (RHEUMATREX) 2.5 MG tablet, Take 20 mg by mouth once a week., Disp: , Rfl:    Multiple Vitamins-Minerals (MULTIVITAL) tablet, Take 1 tablet by mouth daily.  , Disp: , Rfl:    nitroGLYCERIN (NITROSTAT) 0.4 MG SL tablet, PLACE 1 TABLET (0.4 MG TOTAL) UNDER THE TONGUE EVERY 5 (FIVE) MINUTES AS NEEDED FOR CHEST PAIN., Disp: 25 tablet, Rfl: 3   pantoprazole (PROTONIX) 40 MG tablet, Take 1 tablet by mouth daily., Disp: , Rfl:    predniSONE (DELTASONE) 10 MG tablet, Take 3 tablets (30 mg total) by mouth daily with breakfast for 15 days,  THEN 2 tablets (20 mg total) daily with breakfast for 15 days, THEN 1 tablet (10 mg total) daily with breakfast for 15 days, THEN 0.5 tablets (5 mg total) daily with breakfast for 15 days., Disp: 97.5 tablet, Rfl: 0   sulfamethoxazole-trimethoprim (BACTRIM DS) 800-160 MG tablet, Take 1 tablet by mouth 3 (three) times a week., Disp: 16 tablet, Rfl: 0   SYMBICORT 160-4.5 MCG/ACT inhaler, TAKE 2 PUFFS BY MOUTH TWICE A DAY, Disp: 10.2 each, Rfl: 5   vitamin B-12 (CYANOCOBALAMIN) 1000 MCG tablet, Take 1,000 mcg by mouth daily., Disp: , Rfl:

## 2022-01-02 ENCOUNTER — Encounter: Payer: Self-pay | Admitting: Pulmonary Disease

## 2022-01-11 ENCOUNTER — Encounter: Payer: Medicare HMO | Admitting: Podiatry

## 2022-01-26 ENCOUNTER — Telehealth: Payer: Self-pay | Admitting: Pulmonary Disease

## 2022-01-26 NOTE — Telephone Encounter (Signed)
We can send in a prescription for anoro 1 puff daily to continue on which would be similar to the medications in duoneb treatments and last her all day if this would be more convenient for her. Otherwise she can continue the nebulizer treatments as needed for wheezing/shortness of breath.  Thanks, JD

## 2022-01-26 NOTE — Telephone Encounter (Signed)
Called and spoke with pt who wants to know if she should still be taking the duoneb sol. Pt said when she was first prescribed the medication she was told to take it every day at least 2-3 times a day. The instructions on the Rx say to take it every 6 hours as needed.   Pt said she ran out of the sol about 1.5 weeks ago and she has not gotten a refill of it yet. Pt said that she has been taking her husband to treatments and while taking him, she has had some increased SOB and also has had some wheezing involved.  Dr. Erin Fulling, please advise on this for pt.

## 2022-01-27 MED ORDER — ANORO ELLIPTA 62.5-25 MCG/ACT IN AEPB: 1.0000 | INHALATION_SPRAY | Freq: Every day | RESPIRATORY_TRACT | 11 refills | Status: DC

## 2022-01-27 NOTE — Telephone Encounter (Signed)
Spoke with the pt and notified of response per Dr Erin Fulling. She verbalized understanding. Nothing further needed. Rx sent to pharm.

## 2022-02-07 DIAGNOSIS — M5136 Other intervertebral disc degeneration, lumbar region: Secondary | ICD-10-CM | POA: Diagnosis not present

## 2022-02-07 DIAGNOSIS — M810 Age-related osteoporosis without current pathological fracture: Secondary | ICD-10-CM | POA: Diagnosis not present

## 2022-02-07 DIAGNOSIS — D649 Anemia, unspecified: Secondary | ICD-10-CM | POA: Diagnosis not present

## 2022-02-07 DIAGNOSIS — M0630 Rheumatoid nodule, unspecified site: Secondary | ICD-10-CM | POA: Diagnosis not present

## 2022-02-07 DIAGNOSIS — R5383 Other fatigue: Secondary | ICD-10-CM | POA: Diagnosis not present

## 2022-02-07 DIAGNOSIS — E559 Vitamin D deficiency, unspecified: Secondary | ICD-10-CM | POA: Diagnosis not present

## 2022-02-07 DIAGNOSIS — M0589 Other rheumatoid arthritis with rheumatoid factor of multiple sites: Secondary | ICD-10-CM | POA: Diagnosis not present

## 2022-02-07 DIAGNOSIS — I2510 Atherosclerotic heart disease of native coronary artery without angina pectoris: Secondary | ICD-10-CM | POA: Diagnosis not present

## 2022-02-07 DIAGNOSIS — M150 Primary generalized (osteo)arthritis: Secondary | ICD-10-CM | POA: Diagnosis not present

## 2022-02-07 DIAGNOSIS — Z79899 Other long term (current) drug therapy: Secondary | ICD-10-CM | POA: Diagnosis not present

## 2022-02-07 DIAGNOSIS — Z86711 Personal history of pulmonary embolism: Secondary | ICD-10-CM | POA: Diagnosis not present

## 2022-02-14 ENCOUNTER — Other Ambulatory Visit: Payer: Self-pay | Admitting: Pulmonary Disease

## 2022-02-22 ENCOUNTER — Encounter: Payer: Self-pay | Admitting: Pulmonary Disease

## 2022-02-22 ENCOUNTER — Ambulatory Visit (INDEPENDENT_AMBULATORY_CARE_PROVIDER_SITE_OTHER): Payer: Medicare HMO

## 2022-02-22 ENCOUNTER — Ambulatory Visit: Payer: Medicare HMO | Admitting: Pulmonary Disease

## 2022-02-22 VITALS — BP 118/62 | HR 92 | Temp 98.4°F | Ht 66.0 in | Wt 190.8 lb

## 2022-02-22 DIAGNOSIS — J90 Pleural effusion, not elsewhere classified: Secondary | ICD-10-CM

## 2022-02-22 DIAGNOSIS — J479 Bronchiectasis, uncomplicated: Secondary | ICD-10-CM

## 2022-02-22 DIAGNOSIS — K449 Diaphragmatic hernia without obstruction or gangrene: Secondary | ICD-10-CM | POA: Diagnosis not present

## 2022-02-22 DIAGNOSIS — R0602 Shortness of breath: Secondary | ICD-10-CM | POA: Diagnosis not present

## 2022-02-22 DIAGNOSIS — J470 Bronchiectasis with acute lower respiratory infection: Secondary | ICD-10-CM | POA: Diagnosis not present

## 2022-02-22 DIAGNOSIS — R1312 Dysphagia, oropharyngeal phase: Secondary | ICD-10-CM

## 2022-02-22 DIAGNOSIS — M069 Rheumatoid arthritis, unspecified: Secondary | ICD-10-CM

## 2022-02-22 MED ORDER — PREDNISONE 10 MG PO TABS: ORAL_TABLET | ORAL | 0 refills | Status: AC

## 2022-02-22 MED ORDER — AMOXICILLIN-POT CLAVULANATE 875-125 MG PO TABS: 1.0000 | ORAL_TABLET | Freq: Two times a day (BID) | ORAL | 0 refills | Status: DC

## 2022-02-22 MED ORDER — IPRATROPIUM-ALBUTEROL 0.5-2.5 (3) MG/3ML IN SOLN: RESPIRATORY_TRACT | 1 refills | Status: DC

## 2022-02-22 MED ORDER — BUDESONIDE-FORMOTEROL FUMARATE 160-4.5 MCG/ACT IN AERO: 2.0000 | INHALATION_SPRAY | Freq: Two times a day (BID) | RESPIRATORY_TRACT | 6 refills | Status: DC

## 2022-02-22 NOTE — Progress Notes (Signed)
Synopsis: Referred in May 2023 for Acute visit  Subjective:   PATIENT ID: Janice [name] GENDER: female DOB: [birth year], MRN: [id number]  HPI  Chief Complaint  Patient presents with   Acute Visit    Pt is here for acute visit. Pt states the SOB started about 3 weeks ago. Pt states the SOB is worse when she is up walking. Iron was reported low. Pt states the Anoro made [name] cough and SOB worse.    Janice [name] is a 76 year old woman, former smoker with rheumatoid arthritis on methotrexate and enbrel and bronchiectasis who returns to pulmonary clinic for acute visit.   She completed steroid taper on 8/15 for pleural effusion possibly related to RA flare.   She reports increasing dyspnea over the past month since stopping [name] steroid taper. She also reports [name] rheumatoid arthritis is flaring at this time with diffuse joint pains of the shoulders, wrists and ankles. She was seen by rheumatoogy 2 weeks ago where she reports blood work showed iron deficiency and is working with [name] primary care on starting iron supplementation.   She remains on enbrel for [name] RA. She is not on methotrexate anymore.   Along with the progressive dyspnea, she reports increase in wheezing. She also reports having chills last Wednesday night and Thursday morning. She is coughing up more mucous. She has run out of duoneb solution and is waiting on [name] mail order from the pharmacy. She has stopped using anoro due to cough.   She had labs done 2 weeks ago at [facility].   She does report an aspiration episode recently.  OV 12/30/21 She was tapered down from '40mg'$  daily at last visit to '20mg'$  daily. She has been on bactrim 3 times weekly. She was started on duoneb treatments and flutter valve therapy for [name] bronchiectasis.   She reports [name] right sided pain has resolved and she denies cough, sputum production, fevers or night sweats..  She is followed by Dr. Dossie Der of Rheumatology. She has  resumed enbrel and denies any joint aches at this time.   She had MBS performed at [facility] with evidence of mild oropharyngeal dysphagia.   OV 11/21/21 She was scheduled for thoracentesis 11/04/21 but based on pleural [place], the effusion was very small and complicated with loculations and septations. Dr. Noemi Chapel viewed the pleural space as well and deemed it too risky for procedure so no thoracentesis was performed.  She was started on '40mg'$  of prednisone for concern of rheuamtoid associated pleural effusion given she had been off [name] rheumatoid arthritis treatment. She was also treated with augmentin for concern of possible parapneumonic effusion in setting of aspiration. MBS was performed at [facility].   Labs 5/17 showed hemoglobin 10.8g/dL, peripheral eosinophil count of 400, ANA 1:320 speckled pattern, CCP >250 and RF 354.   She reports resolution of the right sided chest pain and night sweats since completing 14 day course of augmentin and starting '40mg'$  of prednisone daily. She has resumed [name] methotrexate and enbrel for [name] RA. [name] joint pains are much better.   She now has increased cough over the past 4-5 days and shortness of breath.  She remains on symbicort 160-4.71mg 2 puffs twice daily.   Initial OV 11/02/21 She has progressive dyspnea over the past [redacted] weeks along with occasional night sweats that require [name] to change [name] clothes/sheets. She denies fevers and chills. She complained of right sided flank pain and anterior chest pain that  was worse with cough and deep inspiration. She had CT abdominal scan at [facility] that noted basilar infiltrates, bronchiectasis and loculated right pleural effusion. She was started on doxycycline which has helped improve [name] symptoms somewhat. She most recently had night sweats last night. She reports increase symptoms with trouble swallowing solids over the past month.   She has history of rheumatoid arthritis which tends to affect [name]  hands mostly.   She has been on symbiocrt 160-4.80mg for [name] breathing.   She is accompanied by [name] daughter.    Past Medical History:  Diagnosis Date   Acute myocardial infarction, unspecified site, episode of care unspecified    Arthritis, rheumatoid (HCC)    hx   Back pain, chronic    Pulmonary embolism (HCC)    Scoliosis      Family History  Problem Relation Age of Onset   Cancer Mother      Social History   Socioeconomic History   Marital status: Married    Spouse name: Not on file   Number of children: Not on file   Years of education: Not on file   Highest education level: Not on file  Occupational History   Not on file  Tobacco Use   Smoking status: Former   Smokeless tobacco: Never   Tobacco comments:    has 7 plus pack-year smoking history, but quit in 1981.   Substance and Sexual Activity   Alcohol use: Not Currently    Comment: rare etoh    Drug use: No   Sexual activity: Not Currently  Other Topics Concern   Not on file  Social History Narrative   [name]. She works full time in a [employer] doing nonexertional work. No herbal medications, regular diet. No regular exercise.    Social Determinants of Health   Financial Resource Strain: Not on file  Food Insecurity: Not on file  Transportation Needs: Not on file  Physical Activity: Not on file  Stress: Not on file  Social Connections: Not on file  Intimate Partner Violence: Not on file     Allergies  Allergen Reactions   Erythromycin      Outpatient Medications Prior to Visit  Medication Sig Dispense Refill   ALPRAZolam (XANAX) 0.5 MG tablet Take 0.5 mg by mouth daily as needed for anxiety.     aspirin EC 81 MG tablet Take 81 mg by mouth daily.     Biotin 5000 MCG TABS Take 5,000 mcg by mouth daily.     Cholecalciferol (VITAMIN D3 PO) Take 1 capsule by mouth daily.     ENBREL SURECLICK 50 MG/ML injection Inject 50 mg into the skin once a week.      famotidine (PEPCID) 20 MG tablet Take 20 mg by mouth daily.     Multiple Vitamins-Minerals (MULTIVITAL) tablet Take 1 tablet by mouth daily.       nitroGLYCERIN (NITROSTAT) 0.4 MG SL tablet PLACE 1 TABLET (0.4 MG TOTAL) UNDER THE TONGUE EVERY 5 (FIVE) MINUTES AS NEEDED FOR CHEST PAIN. 25 tablet 3   pantoprazole (PROTONIX) 40 MG tablet Take 1 tablet by mouth daily.     vitamin B-12 (CYANOCOBALAMIN) 1000 MCG tablet Take 1,000 mcg by mouth daily.     ipratropium-albuterol (DUONEB) 0.5-2.5 (3) MG/3ML SOLN NEBULIZE CONTENTS OF 1 VIAL EVERY 6 HOURS AS NEEDED 360 mL 11   sulfamethoxazole-trimethoprim (BACTRIM DS) 800-160 MG tablet Take 1 tablet by mouth 3 (three) times a week. 1Enon  tablet 0   methotrexate (RHEUMATREX) 2.5 MG tablet Take 20 mg by mouth once a week. (Patient not taking: Reported on 02/22/2022)     SYMBICORT 160-4.5 MCG/ACT inhaler TAKE 2 PUFFS BY MOUTH TWICE A DAY (Patient not taking: Reported on 02/22/2022) 10.2 each 5   umeclidinium-vilanterol (ANORO ELLIPTA) 62.5-25 MCG/ACT AEPB Inhale 1 puff into the lungs daily. (Patient not taking: Reported on 02/22/2022) 60 each 11   No facility-administered medications prior to visit.   Review of Systems  Constitutional:  Positive for chills and malaise/fatigue. Negative for diaphoresis, fever and weight loss.  HENT:  Negative for congestion, sinus pain and sore throat.   Eyes: Negative.   Respiratory:  Positive for cough, shortness of breath and wheezing. Negative for hemoptysis and sputum production.   Cardiovascular:  Negative for chest pain, palpitations, orthopnea, claudication and leg swelling.  Gastrointestinal:  Negative for abdominal pain, heartburn, nausea and vomiting.  Genitourinary: Negative.   Musculoskeletal:  Negative for joint pain and myalgias.  Skin:  Negative for rash.  Neurological:  Negative for weakness.  Endo/Heme/Allergies: Negative.   Psychiatric/Behavioral: Negative.      Objective:   Vitals:   02/22/22 0959  BP:  118/62  Pulse: 92  Temp: 98.4 F (36.9 C)  TempSrc: Oral  SpO2: 95%  Weight: 190 lb 12.8 oz (86.5 kg)  Height: '5\' 6"'$  (1.676 m)   Physical Exam Constitutional:      General: She is not in acute distress.    Appearance: She is not ill-appearing.  HENT:     Head: Normocephalic and atraumatic.  Eyes:     General: No scleral icterus.    Conjunctiva/sclera: Conjunctivae normal.  Cardiovascular:     Rate and Rhythm: Normal rate and regular rhythm.     Pulses: Normal pulses.     Heart sounds: Normal heart sounds. No murmur heard. Pulmonary:     Effort: Pulmonary effort is normal.     Breath sounds: Wheezing present. No rhonchi or rales.  Musculoskeletal:     Right lower leg: No edema.     Left lower leg: No edema.  Skin:    General: Skin is warm and dry.  Neurological:     General: No focal deficit present.     Mental Status: She is alert.  Psychiatric:        Mood and Affect: Mood normal.        Behavior: Behavior normal.        Thought Content: Thought content normal.        Judgment: Judgment normal.    CBC    Component Value Date/Time   WBC 6.1 11/02/2021 1148   RBC 4.10 11/02/2021 1148   HGB 10.8 (L) 11/02/2021 1148   HCT 34.2 (L) 11/02/2021 1148   PLT 535.0 (H) 11/02/2021 1148   MCV 83.5 11/02/2021 1148   MCHC 31.6 11/02/2021 1148   RDW 19.7 (H) 11/02/2021 1148   LYMPHSABS 0.9 11/02/2021 1148   MONOABS 0.6 11/02/2021 1148   EOSABS 0.4 11/02/2021 1148   BASOSABS 0.0 11/02/2021 1148      Latest Ref Rng & Units 11/02/2021   11:48 AM 05/16/2009   10:47 AM  BMP  Glucose 70 - 99 mg/dL 114  113   BUN 6 - 23 mg/dL 17  14   Creatinine 0.40 - 1.20 mg/dL 0.71  0.96   Sodium 135 - 145 mEq/L 138  145   Potassium 3.5 - 5.1 mEq/L 4.0  4.1   Chloride 96 -  112 mEq/L 102  110   CO2 19 - 32 mEq/L 25  28   Calcium 8.4 - 10.5 mg/dL 9.8  9.4    Chest imaging: CXR 02/22/22 Small hiatal hernia.   Bronchitic changes with new atelectasis versus infiltrate LEFT  lower lobe.   Vague nodular foci in the mid upper lungs bilaterally; CT chest recommended to exclude developing pulmonary nodules.  CXR 11/21/21 Small right pleural effusion without significant change from prior chest CT. This minimally tracks into the minor fissure, unchanged. Subsegmental linear opacities at the right lung base. No new or progressive airspace disease. The heart is normal in size. Retrocardiac hiatal hernia. No pulmonary edema. No pneumothorax. Mild thoracic scoliosis and spondylosis.  CT Chest w/contrast 11/02/21 Mediastinum/Nodes: No enlarged mediastinal, hilar, or axillary lymph nodes. There is a moderate hiatal hernia. Thyroid gland, trachea, and esophagus demonstrate no significant findings.   Lungs/Pleura: There is a small loculated right pleural effusion on the right with fluid both anteriorly and posteriorly. There is mild associated atelectasis with no significant pleural thickening or pulmonary mass. There is no pleural effusion on the left and the left lung is clear. There is no pneumothorax on either side. Bilateral bronchiectasis with a lower lung predominance is noted.  PFT:     No data to display          Labs:  Path:  Echo:  Heart Catheterization:  Assessment & Plan:   Bronchiectasis with acute lower respiratory infection ([place]) - Plan: ipratropium-albuterol (DUONEB) 0.5-2.5 (3) MG/3ML SOLN, DG Chest 2 View, Respiratory or Resp and Sputum Culture, budesonide-formoterol (SYMBICORT) 160-4.5 MCG/ACT inhaler, amoxicillin-clavulanate (AUGMENTIN) 875-125 MG tablet, predniSONE (DELTASONE) 10 MG tablet  Rheumatoid arthritis flare (HCC) - Plan: DG Chest 2 View  Loculated pleural effusion  Oropharyngeal dysphagia  Discussion: Janice [name] is a 76 year old woman, former smoker with rheumatoid arthritis on methotrexate and enbrel and bronchiectasis who returns to pulmonary clinic for acute visit.   She has left lower lobe infiltrate and  recent aspiration event concerning for aspiration pneumonia vs CAP/bronchiecatsis exacerbation vs inflammatory lung disease due to RA.   She is to use [name] duoneb treatments 3 times per day with flutter valve for airway clearance.   We will start [name] on augmentin BID for 14 days and extended steroid taper.   We will check sputum culture today.   Follow up in 6 months.  Freda Jackson, MD [facility] Office: [phone]     Current Outpatient Medications:    ALPRAZolam (XANAX) 0.5 MG tablet, Take 0.5 mg by mouth daily as needed for anxiety., Disp: , Rfl:    amoxicillin-clavulanate (AUGMENTIN) 875-125 MG tablet, Take 1 tablet by mouth 2 (two) times daily., Disp: 28 tablet, Rfl: 0   aspirin EC 81 MG tablet, Take 81 mg by mouth daily., Disp: , Rfl:    Biotin 5000 MCG TABS, Take 5,000 mcg by mouth daily., Disp: , Rfl:    budesonide-formoterol (SYMBICORT) 160-4.5 MCG/ACT inhaler, Inhale 2 puffs into the lungs 2 (two) times daily., Disp: 1 each, Rfl: 6   Cholecalciferol (VITAMIN D3 PO), Take 1 capsule by mouth daily., Disp: , Rfl:    ENBREL SURECLICK 50 MG/ML injection, Inject 50 mg into the skin once a week., Disp: , Rfl:    famotidine (PEPCID) 20 MG tablet, Take 20 mg by mouth daily., Disp: , Rfl:    Multiple Vitamins-Minerals (MULTIVITAL) tablet, Take 1 tablet by mouth daily.  , Disp: , Rfl:  nitroGLYCERIN (NITROSTAT) 0.4 MG SL tablet, PLACE 1 TABLET (0.4 MG TOTAL) UNDER THE TONGUE EVERY 5 (FIVE) MINUTES AS NEEDED FOR CHEST PAIN., Disp: 25 tablet, Rfl: 3   pantoprazole (PROTONIX) 40 MG tablet, Take 1 tablet by mouth daily., Disp: , Rfl:    predniSONE (DELTASONE) 10 MG tablet, Take 2 tablets (20 mg total) by mouth daily with breakfast for 5 days, THEN 1.5 tablets (15 mg total) daily with breakfast for 5 days, THEN 1 tablet (10 mg total) daily with breakfast for 5 days, THEN 0.5 tablets (5 mg total) daily with breakfast for 5 days., Disp: 25 tablet, Rfl: 0   vitamin  B-12 (CYANOCOBALAMIN) 1000 MCG tablet, Take 1,000 mcg by mouth daily., Disp: , Rfl:    ipratropium-albuterol (DUONEB) 0.5-2.5 (3) MG/3ML SOLN, NEBULIZE CONTENTS OF 1 VIAL EVERY 6 HOURS AS NEEDED, Disp: 360 mL, Rfl: 1

## 2022-02-22 NOTE — Patient Instructions (Addendum)
We will check a chest x-ray today  We will check a sputum culture today  I am concerned your rheumatoid arthritis is affecting your lungs or you are having a flare of your bronchiectasis.   Start duoneb nebulizer treatments 3 times per day followed by flutter valve therapy for mucous clearance  Stop using anoro.   You can resume symbicort inhaler 2 puffs twice daily - rinse mouth out after each use  We will call you with the results of the chest x-ray and determine whether we need to add a steroid taper and antibiotics  Follow up in 6 months

## 2022-02-24 DIAGNOSIS — D508 Other iron deficiency anemias: Secondary | ICD-10-CM | POA: Diagnosis not present

## 2022-02-25 ENCOUNTER — Encounter: Payer: Self-pay | Admitting: Pulmonary Disease

## 2022-02-27 ENCOUNTER — Telehealth: Payer: Self-pay

## 2022-03-01 ENCOUNTER — Other Ambulatory Visit: Payer: Self-pay | Admitting: Oncology

## 2022-03-01 DIAGNOSIS — D508 Other iron deficiency anemias: Secondary | ICD-10-CM

## 2022-03-01 NOTE — Progress Notes (Signed)
[facility]  [address] [place],  [place]  [address] [phone]  Clinic Day:  03/02/2022  Referring physician: No ref. provider found   HISTORY OF PRESENT ILLNESS:  The patient is a 76 y.o. female  who I was asked to consult upon for iron deficiency anemia.  Recent labs showed a low hemoglobin of 8.8, with a low MCV of 80.  Iron studies done recently showed a low ferritin of 9.5, a low serum iron of 11, a TIBC of 455, and a low iron saturation of 2%.  According to the patient, she has had some spotty vaginal bleeding which she attributes to her pessary being placed 3 months ago.  She also believes she has had occasional rectal bleeding, which she attributes to external hemorrhoids.  She denies having other overt forms of blood loss.  She claims to have had a colonoscopy 5 years ago for which only polyps were seen and removed.  She has had ice cravings over the past 2 months.  This, in conjunction with her weakness, had her concerned about iron deficiency anemia being an issue.  Of note, she did need a blood transfusion last year due to anemia.  She recalls her mother having anemia.  To her knowledge, there is no other family history of anemia or other hematologic disorders.  PAST MEDICAL HISTORY:   Past Medical History:  Diagnosis Date   Acute myocardial infarction, unspecified site, episode of care unspecified    Anemia    Arthritis, rheumatoid (HCC)    hx   Back pain, chronic    GERD (gastroesophageal reflux disease)    Pulmonary embolism (HCC)    Scoliosis    Skin cancer     PAST SURGICAL HISTORY:   Past Surgical History:  Procedure Laterality Date   bladder tack     BUNIONECTOMY Bilateral    COLONOSCOPY W/ POLYPECTOMY     HERNIA REPAIR     RECTOCELE REPAIR     TONSILLECTOMY     TUBAL LIGATION      CURRENT MEDICATIONS:   Current Outpatient Medications  Medication Sig Dispense Refill   ALPRAZolam (XANAX) 0.5 MG tablet Take 0.5 mg  by mouth daily as needed for anxiety.     amoxicillin-clavulanate (AUGMENTIN) 875-125 MG tablet Take 1 tablet by mouth 2 (two) times daily. 28 tablet 0   aspirin EC 81 MG tablet Take 81 mg by mouth daily.     Biotin 5000 MCG TABS Take 5,000 mcg by mouth daily.     budesonide-formoterol (SYMBICORT) 160-4.5 MCG/ACT inhaler Inhale 2 puffs into the lungs 2 (two) times daily. 1 each 6   Cholecalciferol (VITAMIN D3 PO) Take 1 capsule by mouth daily.     ENBREL SURECLICK 50 MG/ML injection Inject 50 mg into the skin once a week.     famotidine (PEPCID) 20 MG tablet Take 20 mg by mouth daily.     ipratropium-albuterol (DUONEB) 0.5-2.5 (3) MG/3ML SOLN NEBULIZE CONTENTS OF 1 VIAL EVERY 6 HOURS AS NEEDED 360 mL 1   Multiple Vitamins-Minerals (MULTIVITAL) tablet Take 1 tablet by mouth daily.       nitroGLYCERIN (NITROSTAT) 0.4 MG SL tablet PLACE 1 TABLET (0.4 MG TOTAL) UNDER THE TONGUE EVERY 5 (FIVE) MINUTES AS NEEDED FOR CHEST PAIN. 25 tablet 3   pantoprazole (PROTONIX) 40 MG tablet Take 1 tablet by mouth daily.     predniSONE (DELTASONE) 10 MG tablet Take 2 tablets (20 mg total) by mouth daily with  breakfast for 5 days, THEN 1.5 tablets (15 mg total) daily with breakfast for 5 days, THEN 1 tablet (10 mg total) daily with breakfast for 5 days, THEN 0.5 tablets (5 mg total) daily with breakfast for 5 days. 25 tablet 0   vitamin B-12 (CYANOCOBALAMIN) 1000 MCG tablet Take 1,000 mcg by mouth daily.     No current facility-administered medications for this visit.    ALLERGIES:   Allergies  Allergen Reactions   Erythromycin     FAMILY HISTORY:   Family History  Problem Relation Age of Onset   Lung cancer Mother    Esophageal cancer Mother    Colon cancer Maternal Grandmother     SOCIAL HISTORY:  The patient was born and raised in [place].  She currently lives in [place] with her husband of 72 years.  They have 3 children, 1 of whom is deceased.  They also have 6 grandchildren.  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  She smoked a half a pack of cigarettes daily for 15 years before quitting 42 years ago.  There is no history of alcohol abuse.  REVIEW OF SYSTEMS:  Review of Systems  Constitutional:  Negative for fatigue and fever.  HENT:   Negative for hearing loss and sore throat.   Eyes:  Negative for eye problems.  Respiratory:  Positive for cough and shortness of breath. Negative for chest tightness and hemoptysis.   Cardiovascular:  Negative for chest pain and palpitations.  Gastrointestinal:  Positive for blood in stool. Negative for abdominal distention, abdominal pain, constipation, diarrhea, nausea and vomiting.  Endocrine: Negative for hot flashes.  Genitourinary:  Negative for difficulty urinating, dysuria, frequency, hematuria and nocturia.   Musculoskeletal:  Positive for arthralgias. Negative for back pain, gait problem and myalgias.  Skin: Negative.  Negative for itching and rash.  Neurological: Negative.  Negative for dizziness, extremity weakness, gait problem, headaches, light-headedness and numbness.  Hematological: Negative.   Psychiatric/Behavioral: Negative.  Negative for depression and suicidal ideas. The patient is not nervous/anxious.     PHYSICAL EXAM:  Blood pressure (!) 143/84, pulse 80, temperature 98.7 F (37.1 C), resp. rate 14, height '5\' 6"'$  (1.676 m), weight 192 lb 4.8 oz (87.2 kg), SpO2 96 %. Wt Readings from Last 3 Encounters:  03/02/22 192 lb 4.8 oz (87.2 kg)  02/22/22 190 lb 12.8 oz (86.5 kg)  12/30/21 188 lb (85.3 kg)   Body mass index is 31.04 kg/m. Performance status (ECOG): 1 - Symptomatic but completely ambulatory Physical Exam Constitutional:      Appearance: Normal appearance. She is not ill-appearing.  HENT:     Mouth/Throat:     Mouth: Mucous membranes are moist.     Pharynx: Oropharynx is clear. No oropharyngeal exudate or posterior oropharyngeal erythema.  Cardiovascular:     Rate and Rhythm:  Normal rate and regular rhythm.     Heart sounds: No murmur heard.    No friction rub. No gallop.  Pulmonary:     Effort: Pulmonary effort is normal. No respiratory distress.     Breath sounds: Normal breath sounds. No wheezing, rhonchi or rales.  Abdominal:     General: Bowel sounds are normal. There is no distension.     Palpations: Abdomen is soft. There is no mass.     Tenderness: There is no abdominal tenderness.  Musculoskeletal:        General: No swelling.     Right lower leg: No edema.  Left lower leg: No edema.  Lymphadenopathy:     Cervical: No cervical adenopathy.     Upper Body:     Right upper body: No supraclavicular or axillary adenopathy.     Left upper body: No supraclavicular or axillary adenopathy.     Lower Body: No right inguinal adenopathy. No left inguinal adenopathy.  Skin:    General: Skin is warm.     Coloration: Skin is not jaundiced.     Findings: No lesion or rash.  Neurological:     General: No focal deficit present.     Mental Status: She is alert and oriented to person, place, and time. Mental status is at baseline.  Psychiatric:        Mood and Affect: Mood normal.        Behavior: Behavior normal.        Thought Content: Thought content normal.    LABS:      Latest Ref Rng & Units 03/02/2022   12:00 AM 11/02/2021   11:48 AM 12/15/2019   11:32 AM  CBC  WBC  16.0     6.1  6.9   Hemoglobin 12.0 - 16.0 9.3     10.8  13.2   Hematocrit 36 - 46 31     34.2  40.4   Platelets 150 - 400 K/uL 732     535.0  292.0      This result is from an external source.      Latest Ref Rng & Units 03/02/2022   12:00 AM 11/02/2021   11:48 AM 05/16/2009   10:47 AM  CMP  Glucose 70 - 99 mg/dL  114  113   BUN 4 - '21 17     17  14   '$ Creatinine 0.5 - 1.1 0.7     0.71  0.96   Sodium 137 - 147 137     138  145   Potassium 3.5 - 5.1 mEq/L 4.4     4.0  4.1   Chloride 99 - 108 103     102  110   CO2 13 - '22 25     25  28   '$ Calcium 8.7 - 10.7 9.7     9.8  9.4    Total Protein 6.0 - 8.3 g/dL   6.8   Total Bilirubin 0.3 - 1.2 mg/dL   0.5   Alkaline Phos 25 - 125 34      40   AST 13 - 35 28      42   ALT 7 - 35 U/L 19      35      This result is from an external source.    Latest Reference Range & Units 03/02/22 14:05  Iron 28 - 170 ug/dL 14 (L)  UIBC ug/dL 476  TIBC 250 - 450 ug/dL 490 (H)  Saturation Ratios 10.4 - 31.8 % 3 (L)  Ferritin 11 - 307 ng/mL 7 (L)  Folate >5.9 ng/mL 32.6  Vitamin B12 180 - 914 pg/mL 2,739 (H)  (L): Data is abnormally low (H): Data is abnormally high  ASSESSMENT & PLAN:  A 76 y.o. female who I was asked to consult upon for iron deficiency anemia.  I will arrange for her to receive IV iron over these next few weeks to rapidly replenish her iron stores and normalize her hemoglobin.  I do believe she warrants a GI work-up to ensure no ominous GI tract process is behind her iron  deficiency anemia.  I will refer her to Dr. Lyndel Safe for further evaluation.  Otherwise, I will see her back in 3 months to reassess her iron and hemoglobin levels to see how well she responded to her upcoming IV iron.  The patient understands all the plans discussed today and is in agreement with them.  I do appreciate No ref. provider found for his new consult.   Londyn Hotard Macarthur Critchley, MD

## 2022-03-02 ENCOUNTER — Other Ambulatory Visit: Payer: Self-pay | Admitting: Oncology

## 2022-03-02 ENCOUNTER — Inpatient Hospital Stay: Payer: Medicare HMO

## 2022-03-02 ENCOUNTER — Encounter: Payer: Self-pay | Admitting: Oncology

## 2022-03-02 ENCOUNTER — Inpatient Hospital Stay: Payer: Medicare HMO | Attending: Oncology | Admitting: Oncology

## 2022-03-02 VITALS — BP 143/84 | HR 80 | Temp 98.7°F | Resp 14 | Ht 66.0 in | Wt 192.3 lb

## 2022-03-02 DIAGNOSIS — D649 Anemia, unspecified: Secondary | ICD-10-CM | POA: Diagnosis not present

## 2022-03-02 DIAGNOSIS — R531 Weakness: Secondary | ICD-10-CM | POA: Diagnosis not present

## 2022-03-02 DIAGNOSIS — D508 Other iron deficiency anemias: Secondary | ICD-10-CM

## 2022-03-02 DIAGNOSIS — D509 Iron deficiency anemia, unspecified: Secondary | ICD-10-CM | POA: Diagnosis not present

## 2022-03-02 DIAGNOSIS — Z87891 Personal history of nicotine dependence: Secondary | ICD-10-CM

## 2022-03-02 LAB — CBC AND DIFFERENTIAL
HCT: 31 — AB (ref 36–46)
Hemoglobin: 9.3 — AB (ref 12.0–16.0)
Neutrophils Absolute: 14.4
Platelets: 732 10*3/uL — AB (ref 150–400)
WBC: 16

## 2022-03-02 LAB — FOLATE: Folate: 32.6 ng/mL (ref >5.9)

## 2022-03-02 LAB — IRON AND TIBC
Iron: 14 ug/dL — ABNORMAL LOW (ref 28–170)
Saturation Ratios: 3 % — ABNORMAL LOW (ref 10.4–31.8)
TIBC: 490 ug/dL — ABNORMAL HIGH (ref 250–450)
UIBC: 476 ug/dL

## 2022-03-02 LAB — BASIC METABOLIC PANEL
BUN: 17 (ref 4–21)
CO2: 25 — AB (ref 13–22)
Chloride: 103 (ref 99–108)
Creatinine: 0.7 (ref 0.5–1.1)
Glucose: 135
Potassium: 4.4 mEq/L (ref 3.5–5.1)
Sodium: 137 (ref 137–147)

## 2022-03-02 LAB — CBC: RBC: 4.31 (ref 3.87–5.11)

## 2022-03-02 LAB — HEPATIC FUNCTION PANEL
ALT: 19 U/L (ref 7–35)
AST: 28 (ref 13–35)
Alkaline Phosphatase: 34 (ref 25–125)
Bilirubin, Total: 0.4

## 2022-03-02 LAB — COMPREHENSIVE METABOLIC PANEL
Albumin: 4.4 (ref 3.5–5.0)
Calcium: 9.7 (ref 8.7–10.7)

## 2022-03-02 LAB — FERRITIN: Ferritin: 7 ng/mL — ABNORMAL LOW (ref 11–307)

## 2022-03-02 LAB — VITAMIN B12: Vitamin B-12: 2739 pg/mL — ABNORMAL HIGH (ref 180–914)

## 2022-03-03 ENCOUNTER — Telehealth: Payer: Self-pay | Admitting: Oncology

## 2022-03-03 DIAGNOSIS — N3941 Urge incontinence: Secondary | ICD-10-CM | POA: Diagnosis not present

## 2022-03-03 DIAGNOSIS — N3946 Mixed incontinence: Secondary | ICD-10-CM | POA: Diagnosis not present

## 2022-03-03 DIAGNOSIS — N393 Stress incontinence (female) (male): Secondary | ICD-10-CM | POA: Diagnosis not present

## 2022-03-03 DIAGNOSIS — R339 Retention of urine, unspecified: Secondary | ICD-10-CM | POA: Diagnosis not present

## 2022-03-03 DIAGNOSIS — Z4689 Encounter for fitting and adjustment of other specified devices: Secondary | ICD-10-CM | POA: Diagnosis not present

## 2022-03-03 DIAGNOSIS — N813 Complete uterovaginal prolapse: Secondary | ICD-10-CM | POA: Diagnosis not present

## 2022-03-03 DIAGNOSIS — T8389XA Other specified complication of genitourinary prosthetic devices, implants and grafts, initial encounter: Secondary | ICD-10-CM | POA: Diagnosis not present

## 2022-03-03 DIAGNOSIS — N952 Postmenopausal atrophic vaginitis: Secondary | ICD-10-CM | POA: Diagnosis not present

## 2022-03-03 NOTE — Telephone Encounter (Signed)
Appts have been scheduled. Pt and her daughter aware of appt dates and times.   Scheduling Message Entered by Juanetta Beets on 03/02/2022 at  3:58 PM Priority: Routine <No visit type provided>  Department: [facility]  Provider:   Appointment Notes:  Appts in for feraheme but insurance prefers venofer so please add 3 more appts and let pt know.  Scheduling Notes:

## 2022-03-08 ENCOUNTER — Other Ambulatory Visit: Payer: Self-pay | Admitting: Pharmacist

## 2022-03-09 ENCOUNTER — Ambulatory Visit: Payer: Medicare HMO

## 2022-03-09 MED FILL — Iron Sucrose Inj 20 MG/ML (Fe Equiv): INTRAVENOUS | Qty: 10 | Status: AC

## 2022-03-10 ENCOUNTER — Encounter: Payer: Self-pay | Admitting: Oncology

## 2022-03-10 ENCOUNTER — Inpatient Hospital Stay: Payer: Medicare HMO

## 2022-03-10 VITALS — BP 145/82 | HR 77 | Temp 98.0°F | Resp 18 | Ht 66.0 in | Wt 192.0 lb

## 2022-03-10 DIAGNOSIS — D508 Other iron deficiency anemias: Secondary | ICD-10-CM

## 2022-03-10 DIAGNOSIS — D509 Iron deficiency anemia, unspecified: Secondary | ICD-10-CM | POA: Diagnosis not present

## 2022-03-10 DIAGNOSIS — R531 Weakness: Secondary | ICD-10-CM | POA: Diagnosis not present

## 2022-03-10 MED ORDER — SODIUM CHLORIDE 0.9 % IV SOLN
200.0000 mg | Freq: Once | INTRAVENOUS | Status: AC
  Administered 2022-03-10: 200 mg via INTRAVENOUS
  Filled 2022-03-10: qty 200

## 2022-03-10 MED ORDER — SODIUM CHLORIDE 0.9 % IV SOLN: Freq: Once | INTRAVENOUS | Status: AC

## 2022-03-10 MED FILL — Iron Sucrose Inj 20 MG/ML (Fe Equiv): INTRAVENOUS | Qty: 10 | Status: AC

## 2022-03-10 NOTE — Patient Instructions (Signed)

## 2022-03-13 ENCOUNTER — Ambulatory Visit: Payer: Medicare HMO | Admitting: Pulmonary Disease

## 2022-03-13 ENCOUNTER — Inpatient Hospital Stay: Payer: Medicare HMO

## 2022-03-13 VITALS — BP 142/78 | HR 72 | Temp 98.2°F | Resp 18

## 2022-03-13 DIAGNOSIS — D508 Other iron deficiency anemias: Secondary | ICD-10-CM

## 2022-03-13 DIAGNOSIS — D509 Iron deficiency anemia, unspecified: Secondary | ICD-10-CM | POA: Diagnosis not present

## 2022-03-13 DIAGNOSIS — R531 Weakness: Secondary | ICD-10-CM | POA: Diagnosis not present

## 2022-03-13 MED ORDER — SODIUM CHLORIDE 0.9 % IV SOLN: Freq: Once | INTRAVENOUS | Status: AC

## 2022-03-13 MED ORDER — SODIUM CHLORIDE 0.9 % IV SOLN
200.0000 mg | Freq: Once | INTRAVENOUS | Status: AC
  Administered 2022-03-13: 200 mg via INTRAVENOUS
  Filled 2022-03-13: qty 200

## 2022-03-13 MED FILL — Iron Sucrose Inj 20 MG/ML (Fe Equiv): INTRAVENOUS | Qty: 10 | Status: AC

## 2022-03-14 ENCOUNTER — Inpatient Hospital Stay: Payer: Medicare HMO

## 2022-03-14 VITALS — BP 140/70 | HR 91 | Temp 98.3°F | Resp 18 | Ht 66.0 in | Wt 194.1 lb

## 2022-03-14 DIAGNOSIS — D508 Other iron deficiency anemias: Secondary | ICD-10-CM

## 2022-03-14 DIAGNOSIS — D509 Iron deficiency anemia, unspecified: Secondary | ICD-10-CM | POA: Diagnosis not present

## 2022-03-14 DIAGNOSIS — R531 Weakness: Secondary | ICD-10-CM | POA: Diagnosis not present

## 2022-03-14 MED ORDER — SODIUM CHLORIDE 0.9 % IV SOLN: Freq: Once | INTRAVENOUS | Status: AC

## 2022-03-14 MED ORDER — SODIUM CHLORIDE 0.9 % IV SOLN
200.0000 mg | Freq: Once | INTRAVENOUS | Status: AC
  Administered 2022-03-14: 200 mg via INTRAVENOUS
  Filled 2022-03-14: qty 200

## 2022-03-14 MED FILL — Iron Sucrose Inj 20 MG/ML (Fe Equiv): INTRAVENOUS | Qty: 10 | Status: AC

## 2022-03-14 NOTE — Patient Instructions (Signed)

## 2022-03-15 ENCOUNTER — Inpatient Hospital Stay: Payer: Medicare HMO

## 2022-03-15 VITALS — BP 131/64 | HR 79 | Temp 98.2°F | Resp 20 | Ht 66.0 in | Wt 194.0 lb

## 2022-03-15 DIAGNOSIS — R531 Weakness: Secondary | ICD-10-CM | POA: Diagnosis not present

## 2022-03-15 DIAGNOSIS — D509 Iron deficiency anemia, unspecified: Secondary | ICD-10-CM | POA: Diagnosis not present

## 2022-03-15 DIAGNOSIS — D508 Other iron deficiency anemias: Secondary | ICD-10-CM

## 2022-03-15 MED ORDER — SODIUM CHLORIDE 0.9 % IV SOLN
200.0000 mg | Freq: Once | INTRAVENOUS | Status: AC
  Administered 2022-03-15: 200 mg via INTRAVENOUS
  Filled 2022-03-15: qty 200

## 2022-03-15 MED ORDER — SODIUM CHLORIDE 0.9 % IV SOLN: Freq: Once | INTRAVENOUS | Status: AC

## 2022-03-15 MED FILL — Iron Sucrose Inj 20 MG/ML (Fe Equiv): INTRAVENOUS | Qty: 10 | Status: AC

## 2022-03-15 NOTE — Patient Instructions (Signed)
Iron Deficiency Anemia, Adult  Iron deficiency anemia is when you do not have enough red blood cells or hemoglobin in your blood. This happens because you have too little iron in your body. Hemoglobin carries oxygen to parts of the body. Anemia can cause your body to not get enough oxygen. What are the causes? Not eating enough foods that have iron in them. The body not being able to take in iron well. Blood loss. What increases the risk? Having menstrual periods. Being pregnant. What are the signs or symptoms? Pale skin, lips, and nails. Weakness, dizziness, and getting tired easily. Feeling like you cannot breathe well when moving (shortness of breath). Cold hands and feet. Mild anemia may not cause any symptoms. How is this treated? This condition is treated by finding out why you do not have enough iron and then getting more iron. It may include: Adding foods to your diet that have a lot of iron. Taking iron pills (supplements). If you are pregnant or breastfeeding, you may need to take extra iron. Your diet often does not provide the amount of iron that you need. Getting more vitamin C in your diet. Vitamin C helps your body take in iron. You may need to take iron pills with a glass of orange juice or vitamin C pills. Medicines to make heavy menstrual periods lighter. Surgery or testing procedures to find what is causing the condition. You may need blood tests to see if treatment is working. If the treatment does not seem to be working, you may need more tests. Follow these instructions at home: Medicines Take over-the-counter and prescription medicines only as told by your doctor. This includes iron pills and vitamins. Taking them as told is important because too much iron can be harmful. Take iron pills when your stomach is empty. If you cannot handle this, take them with food. Do not drink milk or take antacids at the same time as your iron pills. Iron pills may turn your poop  (stool)black. If you cannot handle taking iron pills by mouth, ask your doctor about getting iron through: An IV tube. A shot (injection) into a muscle. Eating and drinking Talk with your doctor before changing the foods you eat. Your doctor may tell you to eat foods that have a lot of iron, such as: Liver. Low-fat (lean) beef. Breads and cereals that have iron added to them. Eggs. Dried fruit. Dark green, leafy vegetables. Eat fresh fruits and vegetables that are high in vitamin C. They help your body use iron. Foods with a lot of vitamin C include: Oranges. Peppers. Tomatoes. Mangoes. Managing constipation If you are taking iron pills, they may cause trouble pooping (constipation). To prevent or treat this, you may need to: Drink enough fluid to keep your pee (urine) pale yellow. Take over-the-counter or prescription medicines. Eat foods that are high in fiber. These include beans, whole grains, and fresh fruits and vegetables. Limit foods that are high in fat and sugar. These include fried or sweet foods. General instructions Return to your normal activities when your doctor says that it is safe. Keep all follow-up visits. Contact a doctor if: You feel like you may vomit (nauseous), or you vomit. You feel weak. You get light-headed when getting up from sitting or lying down. You are sweating for no reason. You have trouble pooping. You have worse breathing with physical activity. You have heaviness in your chest. Get help right away if: You faint. If this happens, do not drive yourself   to the hospital. You have a fast heartbeat, or a heartbeat that does not feel regular. Summary Iron deficiency anemia happens when you have too little iron in your body. This condition is treated by finding out why you do not have enough iron in your body and then getting more iron. Take over-the-counter and prescription medicines only as told by your doctor. Eat fresh fruits and vegetables  that are high in vitamin C. Contact a doctor if you have trouble pooping or feel weak. This information is not intended to replace advice given to you by your health care provider. Make sure you discuss any questions you have with your health care provider. Document Revised: 07/14/2021 Document Reviewed: 07/14/2021 Elsevier Patient Education  2023 Elsevier Inc. Iron-Rich Diet  Iron is a mineral that helps your body produce hemoglobin. Hemoglobin is a protein in red blood cells that carries oxygen to your body's tissues. Eating too little iron may cause you to feel weak and tired, and it can increase your risk of infection. Iron is naturally found in many foods, and many foods have iron added to them (are iron-fortified). You may need to follow an iron-rich diet if you do not have enough iron in your body due to certain medical conditions. The amount of iron that you need each day depends on your age, your sex, and any medical conditions you have. Follow instructions from your health care provider or a dietitian about how much iron you should eat each day. What are tips for following this plan? Reading food labels Check food labels to see how many milligrams (mg) of iron are in each serving. Cooking Cook foods in pots and pans that are made from iron. Take these steps to make it easier for your body to absorb iron from certain foods: Soak beans overnight before cooking. Soak whole grains overnight and drain them before using. Ferment flours before baking, such as by using yeast in bread dough. Meal planning When you eat foods that contain iron, you should eat them with foods that are high in vitamin C. These include oranges, peppers, tomatoes, potatoes, and mangoes. Vitamin C helps your body absorb iron. Certain foods and drinks prevent your body from absorbing iron properly. Avoid eating these foods in the same meal as iron-rich foods or with iron supplements. These foods include: Coffee, black  tea, and red wine. Milk, dairy products, and foods that are high in calcium. Beans and soybeans. Whole grains. General information Take iron supplements only as told by your health care provider. An overdose of iron can be life-threatening. If you were prescribed iron supplements, take them with orange juice or a vitamin C supplement. When you eat iron-fortified foods or take an iron supplement, you should also eat foods that naturally contain iron, such as meat, poultry, and fish. Eating naturally iron-rich foods helps your body absorb the iron that is added to other foods or contained in a supplement. Iron from animal sources is better absorbed than iron from plant sources. What foods should I eat? Fruits Prunes. Raisins. Eat fruits high in vitamin C, such as oranges, grapefruits, and strawberries, with iron-rich foods. Vegetables Spinach (cooked). Green peas. Broccoli. Fermented vegetables. Eat vegetables high in vitamin C, such as leafy greens, potatoes, bell peppers, and tomatoes, with iron-rich foods. Grains Iron-fortified breakfast cereal. Iron-fortified whole-wheat bread. Enriched rice. Sprouted grains. Meats and other proteins Beef liver. Beef. Turkey. Chicken. Oysters. Shrimp. Tuna. Sardines. Chickpeas. Nuts. Tofu. Pumpkin seeds. Beverages Tomato juice. Fresh orange juice.   Prune juice. Hibiscus tea. Iron-fortified instant breakfast shakes. Sweets and desserts Blackstrap molasses. Seasonings and condiments Tahini. Fermented soy sauce. Other foods Wheat germ. The items listed above may not be a complete list of recommended foods and beverages. Contact a dietitian for more information. What foods should I limit? These are foods that should be limited while eating iron-rich foods as they can reduce the absorption of iron in your body. Grains Whole grains. Bran cereal. Bran flour. Meats and other proteins Soybeans. Products made from soy protein. Black beans. Lentils. Mung  beans. Split peas. Dairy Milk. Cream. Cheese. Yogurt. Cottage cheese. Beverages Coffee. Black tea. Red wine. Sweets and desserts Cocoa. Chocolate. Ice cream. Seasonings and condiments Basil. Oregano. Large amounts of parsley. The items listed above may not be a complete list of foods and beverages you should limit. Contact a dietitian for more information. Summary Iron is a mineral that helps your body produce hemoglobin. Hemoglobin is a protein in red blood cells that carries oxygen to your body's tissues. Iron is naturally found in many foods, and many foods have iron added to them (are iron-fortified). When you eat foods that contain iron, you should eat them with foods that are high in vitamin C. Vitamin C helps your body absorb iron. Certain foods and drinks prevent your body from absorbing iron properly, such as whole grains and dairy products. You should avoid eating these foods in the same meal as iron-rich foods or with iron supplements. This information is not intended to replace advice given to you by your health care provider. Make sure you discuss any questions you have with your health care provider. Document Revised: 05/17/2020 Document Reviewed: 05/17/2020 Elsevier Patient Education  2023 Elsevier Inc.  

## 2022-03-16 ENCOUNTER — Inpatient Hospital Stay: Payer: Medicare HMO

## 2022-03-16 VITALS — BP 137/73 | HR 78 | Temp 98.4°F | Resp 18 | Ht 66.0 in

## 2022-03-16 DIAGNOSIS — D508 Other iron deficiency anemias: Secondary | ICD-10-CM

## 2022-03-16 DIAGNOSIS — R531 Weakness: Secondary | ICD-10-CM | POA: Diagnosis not present

## 2022-03-16 DIAGNOSIS — D509 Iron deficiency anemia, unspecified: Secondary | ICD-10-CM | POA: Diagnosis not present

## 2022-03-16 MED ORDER — SODIUM CHLORIDE 0.9 % IV SOLN: Freq: Once | INTRAVENOUS | Status: AC

## 2022-03-16 MED ORDER — SODIUM CHLORIDE 0.9 % IV SOLN
200.0000 mg | Freq: Once | INTRAVENOUS | Status: AC
  Administered 2022-03-16: 200 mg via INTRAVENOUS
  Filled 2022-03-16: qty 200

## 2022-03-16 NOTE — Patient Instructions (Signed)
Iron Deficiency Anemia, Adult  Iron deficiency anemia is a condition in which the concentration of red blood cells or hemoglobin in the blood is below normal because of too little iron. Hemoglobin is a substance in red blood cells that carries oxygen to the body's tissues. When the concentration of red blood cells or hemoglobin is too low, not enough oxygen reaches these tissues. Iron deficiency anemia is usually long-lasting, and it develops over time. It may or may not cause symptoms. It is a common type of anemia. What are the causes? This condition may be caused by: Not enough iron in the diet. Abnormal absorption in the gut. Blood loss. What increases the risk? You are more likely to develop this condition if you get menstrual periods (menstruate) or are pregnant. What are the signs or symptoms? Symptoms of this condition may include: Pale skin, lips, and nail beds. Weakness, dizziness, and getting tired easily. Shortness of breath when moving or exercising. Cold hands or feet. Mild anemia may not cause any symptoms. How is this diagnosed? This condition is diagnosed based on: Your medical history. A physical exam. Blood tests. How is this treated? This condition is treated by correcting the cause of your iron deficiency. Treatment may involve: Adding iron-rich foods to your diet. Taking iron supplements. If you are pregnant or breastfeeding, you may need to take extra iron because your normal diet usually does not provide the amount of iron that you need. Increasing vitamin C intake. Vitamin C helps your body absorb iron. Your health care provider may recommend that you take iron supplements along with a glass of orange juice or a vitamin C supplement. Medicines to make heavy menstrual flow lighter. Surgery or additional testing procedures to determine the cause of your anemia. You may need repeat blood tests to determine whether treatment is working. If the treatment does not  seem to be working, you may need more tests. Follow these instructions at home: Medicines Take over-the-counter and prescription medicines only as told by your health care provider. This includes iron supplements and vitamins. This is important because too much iron can be harmful. For the best iron absorption, you should take iron supplements when your stomach is empty. If you cannot tolerate them on an empty stomach, you may need to take them with food. Do not drink milk or take antacids at the same time as your iron supplements. Milk and antacids may interfere with how your body absorbs iron. Iron supplements may turn stool (feces) a darker color and it may appear black. If you cannot tolerate taking iron supplements by mouth, talk with your health care provider about taking them through an IV or through an injection into a muscle. Eating and drinking Talk with your health care provider before changing your diet. Your provider may recommend that you eat foods that contain a lot of iron, such as: Liver. Low-fat (lean) beef. Breads and cereals that have iron added to them (are fortified). Eggs. Dried fruit. Dark green, leafy vegetables. To help your body use the iron from iron-rich foods, eat those foods at the same time as fresh fruits and vegetables that are high in vitamin C. Foods that are high in vitamin C include: Oranges. Peppers. Tomatoes. Mangoes. Managing constipation If you are taking an iron supplement, it may cause constipation. To prevent or treat constipation, you may need to: Drink enough fluid to keep your urine pale yellow. Take over-the-counter or prescription medicines. Eat foods that are high in fiber, such   as beans, whole grains, and fresh fruits and vegetables. Limit foods that are high in fat and processed sugars, such as fried or sweet foods. General instructions Return to your normal activities as told by your health care provider. Ask your health care provider  what activities are safe for you. Keep all follow-up visits. Contact a health care provider if: You feel nauseous or you vomit. You feel weak. You become light-headed when getting up from a sitting or lying down position. You have unexplained sweating. You develop symptoms of constipation. You have a heaviness in your chest. You have trouble breathing with physical activity. Get help right away if: You faint. If this happens, do not drive yourself to the hospital. You have an irregular or rapid heartbeat. Summary Iron deficiency anemia is a condition in which the concentration of red blood cells or hemoglobin in the blood is below normal because of too little iron. This condition is treated by correcting the cause of your iron deficiency. Take over-the-counter and prescription medicines only as told by your health care provider. This includes iron supplements and vitamins. To help your body use the iron from iron-rich foods, eat those foods at the same time as fresh fruits and vegetables that are high in vitamin C. Seek medical help if you have signs or symptoms of worsening anemia. This information is not intended to replace advice given to you by your health care provider. Make sure you discuss any questions you have with your health care provider. Document Revised: 07/13/2021 Document Reviewed: 07/13/2021 Elsevier Patient Education  2023 Elsevier Inc.  

## 2022-03-31 DIAGNOSIS — R338 Other retention of urine: Secondary | ICD-10-CM | POA: Diagnosis not present

## 2022-03-31 DIAGNOSIS — N813 Complete uterovaginal prolapse: Secondary | ICD-10-CM | POA: Diagnosis not present

## 2022-03-31 DIAGNOSIS — N3946 Mixed incontinence: Secondary | ICD-10-CM | POA: Diagnosis not present

## 2022-03-31 DIAGNOSIS — N952 Postmenopausal atrophic vaginitis: Secondary | ICD-10-CM | POA: Diagnosis not present

## 2022-03-31 DIAGNOSIS — Z4689 Encounter for fitting and adjustment of other specified devices: Secondary | ICD-10-CM | POA: Diagnosis not present

## 2022-04-03 ENCOUNTER — Telehealth: Payer: Self-pay | Admitting: Cardiology

## 2022-04-03 NOTE — Telephone Encounter (Signed)
   Pre-operative Risk Assessment    Patient Name: Janice [name]  DOB: 1946/04/29 MRN: [id number]      Request for Surgical Clearance    Procedure:  hysterectomy bilateral salping oophorectomy   Date of Surgery:  Clearance 06/06/22                                 Surgeon:  Dr. Aundra Dubin Surgeon's Group or Practice Name:  [facility] Phone number:  [phone], Mardene Celeste triage nurse Fax number:  [phone]   Type of Clearance Requested:   - Medical  - Pharmacy:  Hold Aspirin 10 days prior   Type of Anesthesia:  General    Additional requests/questions:    Signed, Selena Zobro   04/03/2022, 10:15 AM

## 2022-04-04 NOTE — Telephone Encounter (Signed)
   Name: Janice [name]  DOB: Mar 22, 1946  MRN: [id number]  Primary Cardiologist: Buford Dresser, MD  Chart reviewed as part of pre-operative protocol coverage. The patient has an upcoming visit scheduled with Dr. Harrell Gave on 04/17/2022 at which time clearance can be addressed in case there are any issues that would impact surgical recommendations.  Hysterectomy with bilateral salping oophorectomy Is not scheduled until 05/27/2022 as below. I added preop FYI to appointment note so that provider is aware to address at time of outpatient visit.  Per office protocol the cardiology provider should forward their finalized clearance decision and recommendations regarding antiplatelet therapy to the requesting party below.    I will route this message as FYI to requesting party and remove this message from the preop box as separate preop APP input not needed at this time.   Please call with any questions.  Lenna Sciara, NP  04/04/2022, 12:31 PM

## 2022-04-12 ENCOUNTER — Encounter: Payer: Self-pay | Admitting: Pulmonary Disease

## 2022-04-12 ENCOUNTER — Ambulatory Visit: Payer: Medicare HMO | Admitting: Podiatry

## 2022-04-12 ENCOUNTER — Ambulatory Visit (INDEPENDENT_AMBULATORY_CARE_PROVIDER_SITE_OTHER): Payer: Medicare HMO

## 2022-04-12 DIAGNOSIS — M2012 Hallux valgus (acquired), left foot: Secondary | ICD-10-CM

## 2022-04-12 DIAGNOSIS — M2011 Hallux valgus (acquired), right foot: Secondary | ICD-10-CM | POA: Diagnosis not present

## 2022-04-12 NOTE — Progress Notes (Signed)
Sx left - mtp fusion. 2nd ht repair - med and pulmonology clearance    Subjective: 76 y.o. female presents today  for follow-up evaluation of a symptomatic bunion and hammertoe to the left foot.  Patient does have a history of bunion surgery over 25 years ago.  She says that her right foot is doing very well however over the last several years she has developed recurrence of her left foot bunion.  It is very symptomatic despite conservative care including wider shoes and shoe gear modifications.    Since the patient was last seen here in the office and surgery was initiated, she did have some lung infections and is currently seeing a pulmonologist.  She says that she is doing much better and she is ready to have her surgery performed.  Past Medical History:  Diagnosis Date   Acute myocardial infarction, unspecified site, episode of care unspecified    Anemia    Arthritis, rheumatoid (HCC)    hx   Back pain, chronic    GERD (gastroesophageal reflux disease)    Pulmonary embolism (HCC)    Scoliosis    Skin cancer    Past Surgical History:  Procedure Laterality Date   bladder tack     BUNIONECTOMY Bilateral    COLONOSCOPY W/ POLYPECTOMY     HERNIA REPAIR     RECTOCELE REPAIR     TONSILLECTOMY     TUBAL LIGATION     Allergies  Allergen Reactions   Erythromycin       Objective: Physical Exam General: The patient is alert and oriented x3 in no acute distress.  Dermatology: Skin is cool, dry and supple bilateral lower extremities. Negative for open lesions or macerations.  Vascular: Palpable pedal pulses bilaterally. No edema or erythema noted. Capillary refill within normal limits.  Neurological: Epicritic and protective threshold grossly intact bilaterally.   Musculoskeletal Exam: Clinical evidence of bunion deformity noted to the respective foot. There is moderate pain on palpation range of motion of the first MPJ. 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  There is also evidence of a hammertoe contracture deformity to the second digit with overlap of the third digit.  This toe is also symptomatic and rubs against her shoes.  Prominent fifth metatarsal head also noted to the left foot with lateral deviation of the metatarsal head and prominence which irritates her foot especially in close toed shoes  Radiographic Exam B/L 04/12/2022: Increased intermetatarsal angle greater than 15 with a hallux abductus angle greater than 30 noted on AP view. Moderate degenerative changes noted within the first MPJ. Hammertoe contracture also noted with an elongated second metatarsal with subluxation at the MTP joint.  Lateral deviation of the hallux and second toe at the level of the MTP also noted  Assessment: 1. HAV w/ bunion deformity left 2.  Hammertoe second left 3.  Tailor's bunion left   Plan of Care:  1. Patient was evaluated. X-Rays reviewed. 2. Today we discussed the conservative versus surgical management of the presenting pathology. The patient opts for surgical management. All possible complications and details of the procedure were explained. All patient questions were answered. No guarantees were expressed or implied. 3. Authorization for surgery was reinitiated today. Surgery will now consist of first MTP arthrodesis left.  Hammertoe arthroplasty with MTP capsulotomy second left.  Possible Weil shortening osteotomy second metatarsal left.  Tailor's bunionectomy with fifth metatarsal osteotomy left. 4.  Risk benefits advantages and disadvantages were all explained.  Postoperative recovery course was also explained in detail.  Patient is ready to have surgery performed.  Patient does live alone.  She states that her daughter will stay with her postoperatively to assist with recovery 5.  Return to clinic 1 week postop  *Patient's daughter is Sharyn Lull *Patient is having hysterectomy surgery 06/06/2022.   Edrick Kins,  DPM [facility]  Dr. Edrick Kins, [address]                                        [place], [place] [address]                Office [phone]  Fax [phone]

## 2022-04-13 ENCOUNTER — Encounter: Payer: Self-pay | Admitting: Podiatry

## 2022-04-13 ENCOUNTER — Telehealth: Payer: Self-pay | Admitting: Cardiology

## 2022-04-13 NOTE — Telephone Encounter (Signed)
   Pre-operative Risk Assessment    Patient Name: Janice [name]  DOB: 17-Jul-1945 MRN: [id number]     Request for Surgical Clearance    Procedure:   Metatarsal Osteotomy Left Foot; Hammer Toe Repair 2nd Toe Left Foot; Hallux M.P.J Fusion  Date of Surgery:  Clearance 04/27/22                                 Surgeon:  Dr. Amalia Hailey Surgeon's Group or Practice Name:  [facility] Phone number:  [phone] Fax number:  [phone]   Type of Clearance Requested:   - Medical  - Pharmacy:  Hold Aspirin TBD by cardiologist   Type of Anesthesia:   Anesthesiologist Choice   Additional requests/questions:  Please fax a copy of medical clearance to the surgeon's office.  [name]   04/13/2022, 3:15 PM

## 2022-04-13 NOTE — Telephone Encounter (Signed)
   Patient Name: Janice [name]  DOB: 08/27/1945 MRN: [id number]  Primary Cardiologist: Buford Dresser, MD  Chart reviewed as part of pre-operative protocol coverage.  JORDAN CARAVEO has upcoming office visit 04/17/22 and clearance will be addressed at that time. Per Dr. Harrell Gave previous recommendations, "Aspirin can be held 3-5 days prior to procedure, based on anticipated bleeding risk of surgery, and it should be restarted as soon as cleared from a surgical perspective.".  I will route this recommendation to the requesting party via Epic fax function and remove from pre-op pool.  Please call with questions.  Loel Dubonnet, NP 04/13/2022, 3:36 PM

## 2022-04-14 ENCOUNTER — Telehealth: Payer: Self-pay | Admitting: Podiatry

## 2022-04-14 NOTE — Telephone Encounter (Signed)
DOS: 04/27/2022  [employer] Medicare  Procedures: Metatarsal Osteotomy Lt (57846), Hammertoe Repair 2nd Lt (96295), Hallux MPJ Fusion Lt (28413), and Capsulotomy, MPJ Release Joint 2nd Lt (24401)  DX: M20.12 and M20.42  Deductible: $0 Out-of-Pocket: $5,900 with $4,461.48 remaining  Prior Authorization is Not required per Joe V.  Call Reference #: [id number]

## 2022-04-17 ENCOUNTER — Telehealth: Payer: Self-pay | Admitting: Pulmonary Disease

## 2022-04-17 ENCOUNTER — Encounter (HOSPITAL_BASED_OUTPATIENT_CLINIC_OR_DEPARTMENT_OTHER): Payer: Self-pay | Admitting: Cardiology

## 2022-04-17 ENCOUNTER — Ambulatory Visit (HOSPITAL_BASED_OUTPATIENT_CLINIC_OR_DEPARTMENT_OTHER): Payer: Medicare HMO | Admitting: Cardiology

## 2022-04-17 VITALS — BP 126/88 | HR 71 | Ht 66.0 in | Wt 196.0 lb

## 2022-04-17 DIAGNOSIS — I252 Old myocardial infarction: Secondary | ICD-10-CM

## 2022-04-17 DIAGNOSIS — E7800 Pure hypercholesterolemia, unspecified: Secondary | ICD-10-CM | POA: Diagnosis not present

## 2022-04-17 DIAGNOSIS — I2510 Atherosclerotic heart disease of native coronary artery without angina pectoris: Secondary | ICD-10-CM | POA: Diagnosis not present

## 2022-04-17 DIAGNOSIS — Z01818 Encounter for other preprocedural examination: Secondary | ICD-10-CM | POA: Diagnosis not present

## 2022-04-17 NOTE — Patient Instructions (Signed)
Medication Instructions:  Your Physician recommend you continue on your current medication as directed.    *If you need a refill on your cardiac medications before your next appointment, please call your pharmacy*   Lab Work: None ordered today   Testing/Procedures: None ordered today   Follow-Up: At [facility], you and your health needs are our priority.  As part of our continuing mission to provide you with exceptional heart care, we have created designated Provider Care Teams.  These Care Teams include your primary Cardiologist (physician) and Advanced Practice Providers (APPs -  Physician Assistants and Nurse Practitioners) who all work together to provide you with the care you need, when you need it.  We recommend signing up for the patient portal called "MyChart".  Sign up information is provided on this After Visit Summary.  MyChart is used to connect with patients for Virtual Visits (Telemedicine).  Patients are able to view lab/test results, encounter notes, upcoming appointments, etc.  Non-urgent messages can be sent to your provider as well.   To learn more about what you can do with MyChart, go to [email or web address].    Your next appointment:   2 year(s)  The format for your next appointment:   In Person  Provider:   Buford Dresser, MD

## 2022-04-17 NOTE — Telephone Encounter (Signed)
Fax received from Dr. Daylene Katayama with [facility] to perform a HAV with bunion deformity left, hammertoe second left and tailor's bunion left on patient.  Patient needs surgery clearance. Surgery is 04/27/2022. Patient was seen on 02/22/2022. Office protocol is a risk assessment can be sent to surgeon if patient has been seen in 60 days or less.   Sending to Dr Erin Fulling for risk assessment or recommendations if patient needs to be seen in office prior to surgical procedure.

## 2022-04-17 NOTE — Progress Notes (Signed)
Cardiology Office Note:    Date:  04/17/2022   ID:  Makaylia, Hewett 02-07-1946, MRN [id number]  PCP:  Gweneth Fritter, FNP  Cardiologist:  Buford Dresser, MD PhD  Referring MD: Gweneth Fritter, FNP   CC: follow up  History of Present Illness:    Janice [name] is a 76 y.o. female with a hx of CAD with MI 2010, rheumatoid arthritis, history of prior PE (provoked after she broke her foot/casted and then placed on OCP, told clots were small), anemia of unclear etiology who is seen for follow up today. I initially met her 02/17/2019 as a new consult at the request of Gweneth Fritter, FNP for the evaluation and management of CAD.  Today:  She says she is doing okay. She will be undergoing a foot surgery 04/27/2022 and a hysterectomy on 06/06/2022 and is here for pre-operative clearance.   She reports that her arthritis has flared up in her bilateral ankles recently.   Additionally, she recalls having a urinary infection fairly recently, but having no hematuria.   She states that mopping her large kitchen floor is likely the most strenuous activity she has performed within the last month.  We reviewed intensity of activity today, and she can achieve > 4METs without symptoms.  She denies any palpitations, chest pain, shortness of breath, or peripheral edema. No lightheadedness, headaches, syncope, orthopnea, or PND.  Past Medical History:  Diagnosis Date   Acute myocardial infarction, unspecified site, episode of care unspecified    Anemia    Arthritis, rheumatoid (HCC)    hx   Back pain, chronic    GERD (gastroesophageal reflux disease)    Pulmonary embolism (HCC)    Scoliosis    Skin cancer     Past Surgical History:  Procedure Laterality Date   bladder tack     BUNIONECTOMY Bilateral    COLONOSCOPY W/ POLYPECTOMY     HERNIA REPAIR     RECTOCELE REPAIR     TONSILLECTOMY     TUBAL LIGATION      Current Medications: Current Outpatient Medications on File  Prior to Visit  Medication Sig   ALPRAZolam (XANAX) 0.5 MG tablet Take 0.5 mg by mouth daily as needed for anxiety.   aspirin EC 81 MG tablet Take 81 mg by mouth daily.   Biotin 5000 MCG TABS Take 5,000 mcg by mouth daily.   budesonide-formoterol (SYMBICORT) 160-4.5 MCG/ACT inhaler Inhale 2 puffs into the lungs 2 (two) times daily.   Calcium Carb-Cholecalciferol 600-5 MG-MCG TABS 1 tablet with food Orally Once a day   Cholecalciferol (VITAMIN D3 PO) Take 1 capsule by mouth daily.   ENBREL SURECLICK 50 MG/ML injection Inject 50 mg into the skin once a week.   famotidine (PEPCID) 20 MG tablet Take 20 mg by mouth daily.   folic acid (FOLVITE) 1 MG tablet Take 1 mg by mouth daily.   ipratropium-albuterol (DUONEB) 0.5-2.5 (3) MG/3ML SOLN NEBULIZE CONTENTS OF 1 VIAL EVERY 6 HOURS AS NEEDED   Multiple Vitamins-Minerals (MULTIVITAL) tablet Take 1 tablet by mouth daily.     nitroGLYCERIN (NITROSTAT) 0.4 MG SL tablet PLACE 1 TABLET (0.4 MG TOTAL) UNDER THE TONGUE EVERY 5 (FIVE) MINUTES AS NEEDED FOR CHEST PAIN.   traMADol (ULTRAM) 50 MG tablet Take 50 mg by mouth 2 (two) times daily as needed.   No current facility-administered medications on file prior to visit.     Allergies:   Erythromycin   Social History  Tobacco Use   Smoking status: Former   Smokeless tobacco: Never   Tobacco comments:    has 7 plus pack-year smoking history, but quit in 1981.   Vaping Use   Vaping Use: Never used  Substance Use Topics   Alcohol use: Not Currently    Comment: rare etoh    Drug use: No     Family History: The patient's family history includes Colon cancer in her maternal grandmother; Esophageal cancer in her mother; Lung cancer in her mother.  ROS:   Please see the history of present illness. All other systems are reviewed and negative.    EKGs/Labs/Other Studies Reviewed:    The following studies were reviewed today:  CT Chest 11/02/2021: FINDINGS: Cardiovascular: Vascular  calcifications are seen in the coronary arteries and aortic arch. Normal heart size. No pericardial effusion.   Mediastinum/Nodes: No enlarged mediastinal, hilar, or axillary lymph nodes. There is a moderate hiatal hernia. Thyroid gland, trachea, and esophagus demonstrate no significant findings.   Lungs/Pleura: There is a small loculated right pleural effusion on the right with fluid both anteriorly and posteriorly. There is mild associated atelectasis with no significant pleural thickening or pulmonary mass. There is no pleural effusion on the left and the left lung is clear. There is no pneumothorax on either side. Bilateral bronchiectasis with a lower lung predominance is noted.   Upper Abdomen: No acute abnormality.   Musculoskeletal: Degenerative changes are seen in the spine.   IMPRESSION: 1. Small loculated right pleural effusion without evidence of malignancy. 2. Bronchiectasis with a lower lung predominance likely reflects recurrent infections.   Aortic Atherosclerosis (ICD10-I70.0).   CT Chest 10/03/2019: FINDINGS: Cardiovascular: Heart is normal size. Calcified plaque over the left main, left anterior descending and lateral circumflex coronary arteries. Thoracic aorta is normal in caliber. Remaining vascular structures are unremarkable.   Mediastinum/Nodes: No significant mediastinal or hilar adenopathy. Remaining mediastinal structures are notable for moderate size hiatal hernia.   Lungs/Pleura: Lungs are adequately inflated and demonstrate a small left pleural effusion with mild linear atelectasis over the lingula and left lower lobe. There is a pleural based 7-8 mm nodular density without significant change compared to 2010 and therefore benign. 3 mm nodular density over the left apex unchanged. Minimal linear scarring over the posterior right lower lobe. Mild bronchiectatic change over the medial lower lobes. Minimal opacification over several distal  bronchi in this region suggesting mild mucous plugging.   Upper Abdomen: Mild calcified plaque over the abdominal aorta. No acute findings.   Musculoskeletal: Mild degenerative change of the spine.   IMPRESSION: 1. Small left pleural effusion. Minimal linear scarring over the lingula, left lower lobe and posterior right base. Mild associated bronchiectasis in the medial lower lobes with subtle mucous plugging.   2. Aortic Atherosclerosis (ICD10-I70.0). Atherosclerotic coronary artery disease.   3.  Moderate size hiatal hernia.  Echo 03/05/19  1. The left ventricle has normal systolic function, with an ejection fraction of 55-60%. The cavity size was normal. Left ventricular diastolic Doppler parameters are consistent with impaired relaxation.  2. The right ventricle has normal systolic function. The cavity was normal. There is no increase in right ventricular wall thickness.  3. The mitral valve is grossly normal. There is mild mitral annular calcification present.  4. The aortic valve is tricuspid. Moderate thickening of the aortic valve. Moderate calcification of the aortic valve. Aortic valve regurgitation is trivial by color flow Doppler.  5. The aorta is normal unless otherwise noted.  Lexiscan 03/05/19 Nuclear stress EF: 47%. visually, the EF appears to be greater than 47% and appears to be normal There was no ST segment deviation noted during stress. This is a low risk study. no evidence of ischemia or previous infarction The study is normal.   Cath 05/17/2009: LM nonobstructive CAD LAD sequential 30% lesions and distal diffuse plaque LCx no obstructive disease RCA no obstructive disease  Cardiac Cath Findings:   Left ventricular angiogram was performed in the RAO projection       which shows mild global left ventricular systolic dysfunction with       ejection fraction of 45%.  No significant mitral regurgitation is       noted.      IMPRESSION:   1. Mild  nonobstructive coronary artery disease.   2. Mild global left ventricular systolic dysfunction.      RECOMMENDATIONS:  I recommend medical management at this time.  We will   start a statin medication as well as a low dose of an ACE inhibitor.   The patient will be treated with Plavix for 1 month given her acute   coronary syndrome during this presentation.  We will check an   echocardiogram today.  She will be moved to a telemetry bed with her   heart rhythm monitored over the next 24 hours.   Echo 05/21/2009   - Left ventricle: The cavity size was normal. Systolic function was     normal. The estimated ejection fraction was in the range of 55% to     60%. Wall motion was normal; there were no regional wall motion     abnormalities. Doppler parameters are consistent with abnormal     left ventricular relaxation (grade 1 diastolic dysfunction).   - Right ventricle: The cavity size was mildly dilated.   - Right atrium: The atrium was mildly dilated.   Impressions:    - Echobright area at RV apex on apical four chamber view most likely     represents prominent trabeculae.   EKG:  EKG is personally reviewed.   04/17/22: NSR at 71 bpm 07/25/2021: NSR at 66 bpm 02/17/19: NSR, PRWP  Recent Labs: 03/02/2022: ALT 19; BUN 17; Creatinine 0.7; Hemoglobin 9.3; Platelets 732; Potassium 4.4; Sodium 137   Recent Lipid Panel    Component Value Date/Time   CHOL  05/17/2009 1230    151        ATP III CLASSIFICATION:  <200     mg/dL   Desirable  200-239  mg/dL   Borderline High  >=240    mg/dL   High          TRIG 76 05/17/2009 1230   HDL 58 05/17/2009 1230   CHOLHDL 2.6 05/17/2009 1230   VLDL 15 05/17/2009 1230   LDLCALC  05/17/2009 1230    78        Total Cholesterol/HDL:CHD Risk Coronary Heart Disease Risk Table                     Men   Women  1/2 Average Risk   3.4   3.3  Average Risk       5.0   4.4  2 X Average Risk   9.6   7.1  3 X Average Risk  23.4   11.0        Use the  calculated Patient Ratio above and the CHD Risk Table to determine the patient's CHD Risk.  ATP III CLASSIFICATION (LDL):  <100     mg/dL   Optimal  100-129  mg/dL   Near or Above                    Optimal  130-159  mg/dL   Borderline  160-189  mg/dL   High  >190     mg/dL   Very High    Physical Exam:    VS:  BP 126/88   Pulse 71   Ht 5' 6" (1.676 m)   Wt 196 lb (88.9 kg)   BMI 31.64 kg/m     Wt Readings from Last 3 Encounters:  04/17/22 196 lb (88.9 kg)  03/15/22 194 lb (88 kg)  03/14/22 194 lb 1.3 oz (88 kg)    GEN: Well nourished, well developed in no acute distress HEENT: Normal, moist mucous membranes NECK: No JVD CARDIAC: regular rhythm, normal S1 and S2, no rubs or gallops. No murmur. VASCULAR: Radial and DP pulses 2+ bilaterally. No carotid bruits RESPIRATORY:  Clear to auscultation without rales, wheezing or rhonchi  ABDOMEN: Soft, non-tender, non-distended MUSCULOSKELETAL:  Ambulates independently SKIN: Warm and dry, trace bilateral LE edema NEUROLOGIC:  Alert and oriented x 3. No focal neuro deficits noted. PSYCHIATRIC:  Normal affect   ASSESSMENT:    1. Pre-op evaluation   2. Coronary artery disease involving native coronary artery of native heart without angina pectoris   3. History of MI (myocardial infarction)   4. Pure hypercholesterolemia     PLAN:    Preoperative cardiovascular evaluation: According to the Revised Cardiac Risk Index (RCRI), her Perioperative Risk of Major Cardiac Event is (%): 0.9  The patient is not currently having active cardiac symptoms, and they can achieve >4 METs of activity based on the [facility] activity status index (6.27 METs)    According to ACC/AHA Guidelines, no further testing is needed.  Proceed with surgery at acceptable risk.  Our service is available as needed in the peri-operative period.     Aspirin can be held 3-5 days prior to procedure, based on anticipated bleeding risk of surgery, and it should be  restarted as soon as cleared from a surgical perspective.  History of MI, nonobstructive CAD Hypercholesterolemia -continue ASA 81 mg except perioperatively, as above -we have recommended statin therapy in the past. Not currently prescribed. There was some concern previously regarding methotrexate and statin, but she is now taking enbrel only. Last LDL 75. Goal LDL <70. Continue to address  Cardiac risk counseling and prevention recommendations: -recommend heart healthy/Mediterranean diet, with whole grains, fruits, vegetable, fish, lean meats, nuts, and olive oil. Limit salt. -recommend moderate walking, 3-5 times/week for 30-50 minutes each session. Aim for at least 150 minutes.week. Goal should be pace of 3 miles/hours, or walking 1.5 miles in 30 minutes -recommend avoidance of tobacco products. Avoid excess alcohol.  Plan for follow up: 2 years or sooner PRN.  Buford Dresser, MD, PhD [place]  [facility]   Medication Adjustments/Labs and Tests Ordered: Current medicines are reviewed at length with the patient today.  Concerns regarding medicines are outlined above.   Orders Placed This Encounter  Procedures   EKG 12-Lead   No orders of the defined types were placed in this encounter.  Patient Instructions  Medication Instructions:  Your Physician recommend you continue on your current medication as directed.    *If you need a refill on your cardiac medications before your next appointment, please call your pharmacy*  Lab Work: None ordered today   Testing/Procedures: None ordered today   Follow-Up: At [facility], you and your health needs are our priority.  As part of our continuing mission to provide you with exceptional heart care, we have created designated Provider Care Teams.  These Care Teams include your primary Cardiologist (physician) and Advanced Practice Providers (APPs -  Physician Assistants and Nurse Practitioners) who all work  together to provide you with the care you need, when you need it.  We recommend signing up for the patient portal called "MyChart".  Sign up information is provided on this After Visit Summary.  MyChart is used to connect with patients for Virtual Visits (Telemedicine).  Patients are able to view lab/test results, encounter notes, upcoming appointments, etc.  Non-urgent messages can be sent to your provider as well.   To learn more about what you can do with MyChart, go to [email or web address].    Your next appointment:   2 year(s)  The format for your next appointment:   In Person  Provider:   Buford Dresser, MD             I,Breanna Adamick,acting as a scribe for Buford Dresser, MD.,have documented all relevant documentation on the behalf of Buford Dresser, MD,as directed by  Buford Dresser, MD while in the presence of Buford Dresser, MD.   I, Buford Dresser, MD, have reviewed all documentation for this visit. The documentation on 04/17/22 for the exam, diagnosis, procedures, and orders are all accurate and complete.   Signed, Buford Dresser, MD PhD 04/17/2022  [facility]

## 2022-04-18 DIAGNOSIS — Z01818 Encounter for other preprocedural examination: Secondary | ICD-10-CM | POA: Diagnosis not present

## 2022-04-18 DIAGNOSIS — I2510 Atherosclerotic heart disease of native coronary artery without angina pectoris: Secondary | ICD-10-CM | POA: Diagnosis not present

## 2022-04-19 NOTE — Telephone Encounter (Signed)
She does not need to be seen in clinic for visit.   She is Low risk based on ARISCAT Score for Postoperative Pulmonary Complications 6.0% risk of in-hospital post-op pulmonary complications (composite including respiratory failure, respiratory infection, pleural effusion, atelectasis, pneumothorax, bronchospasm, aspiration pneumonitis)  Thanks, JD

## 2022-04-20 NOTE — Telephone Encounter (Signed)
OV notes and clearance form have been faxed back to [facility]. Nothing further needed at this time.

## 2022-04-21 ENCOUNTER — Encounter: Payer: Self-pay | Admitting: Acute Care

## 2022-04-21 ENCOUNTER — Ambulatory Visit (INDEPENDENT_AMBULATORY_CARE_PROVIDER_SITE_OTHER): Payer: Medicare HMO | Admitting: Acute Care

## 2022-04-21 VITALS — BP 118/70 | HR 74 | Temp 98.1°F | Ht 66.0 in | Wt 194.4 lb

## 2022-04-21 DIAGNOSIS — R0600 Dyspnea, unspecified: Secondary | ICD-10-CM

## 2022-04-21 DIAGNOSIS — J479 Bronchiectasis, uncomplicated: Secondary | ICD-10-CM | POA: Diagnosis not present

## 2022-04-21 NOTE — Progress Notes (Signed)
History of Present Illness Janice [name] is a 76 y.o. female  former smoker with rheumatoid arthritis on methotrexate and enbrel and bronchiectasis . She has history of loculated pleural effusion. She is followed by Dr. Erin Fulling.    04/21/2022 Pt. Presents for surgical clearance . She states she is currently stable in respect to her bronchiectasis. She is compliant with her Symbicort daily. She has not had any respiratory infections within the last month. She is having a bunion and hammer toe correction. This is an elective surgery. She is having this done by Dr. Amalia Hailey in an outpatient surgical center. Dr. Erin Fulling had already sent a note specifying patient is low risk for surgical complications, however, patient and daughter wanted to complete the visit, as surgery is schgeduled for next week and they want to be sure there is documentation. We did let them know they did not need to be seen, but this is what they preferred.    ARISCAT Score for Postoperative Pulmonary Complications from [email or web address]  on 04/21/2022 ** All calculations should be rechecked by clinician prior to use **  RESULT SUMMARY: 3 points ARISCAT Score  Low risk 1.6% risk of in-hospital post-op pulmonary complications (composite including respiratory failure, respiratory infection, pleural effusion, atelectasis, pneumothorax, bronchospasm, aspiration pneumonitis)   INPUTS: Age, years --> 3 = 51-80 Preoperative SpO? --> 0 = ?96% Respiratory infection in the last month --> 0 = No Preoperative anemia (Hgb ?10 g/dL) --> 0 = No Surgical incision --> 0 = Peripheral Duration of surgery --> 0 = <2 hrs Emergency procedure --> 0 = No  The following are suggestions for preventative measures  Major Pulmonary risks identified in the multifactorial risk analysis are but not limited to a) pneumonia; b) recurrent intubation risk; c) prolonged or recurrent acute respiratory failure needing mechanical ventilation; d) prolonged  hospitalization; e) DVT/Pulmonary embolism; f) Acute Pulmonary edema  Pt. Is low risk for surgical complications.   Recommend 1. Short duration of surgery as much as possible and avoid paralytic if possible 2. Please use Symbicort the morning of surgery, rinse mouth after use.  3. DVT prophylaxis 4. Aggressive pulmonary toilet with o2, bronchodilatation, and incentive spirometry and early ambulation 5. Recommend physical therapy eval post operatively to ensure mobility as soon as able.    Test Results:     Latest Ref Rng & Units 03/02/2022   12:00 AM 11/02/2021   11:48 AM 12/15/2019   11:32 AM  CBC  WBC  16.0     6.1  6.9   Hemoglobin 12.0 - 16.0 9.3     10.8  13.2   Hematocrit 36 - 46 31     34.2  40.4   Platelets 150 - 400 K/uL 732     535.0  292.0      This result is from an external source.       Latest Ref Rng & Units 03/02/2022   12:00 AM 11/02/2021   11:48 AM 05/16/2009   10:47 AM  BMP  Glucose 70 - 99 mg/dL  114  113   BUN 4 - '21 17     17  14   '$ Creatinine 0.5 - 1.1 0.7     0.71  0.96   Sodium 137 - 147 137     138  145   Potassium 3.5 - 5.1 mEq/L 4.4     4.0  4.1   Chloride 99 - 108 103     102  110   CO2  13 - '22 25     25  28   '$ Calcium 8.7 - 10.7 9.7     9.8  9.4      This result is from an external source.    BNP No results found for: "BNP"  ProBNP No results found for: "PROBNP"  PFT No results found for: "FEV1PRE", "FEV1POST", "FVCPRE", "FVCPOST", "TLC", "DLCOUNC", "PREFEV1FVCRT", "PSTFEV1FVCRT"  DG Foot Complete Left  Result Date: 04/12/2022 Please see detailed radiograph report in office note.  DG Foot Complete Right  Result Date: 04/12/2022 Please see detailed radiograph report in office note.    Past medical hx Past Medical History:  Diagnosis Date   Acute myocardial infarction, unspecified site, episode of care unspecified    Anemia    Arthritis, rheumatoid (HCC)    hx   Back pain, chronic    GERD (gastroesophageal reflux disease)     Pulmonary embolism (HCC)    Scoliosis    Skin cancer      Social History   Tobacco Use   Smoking status: Former   Smokeless tobacco: Never   Tobacco comments:    has 7 plus pack-year smoking history, but quit in 1981.   Vaping Use   Vaping Use: Never used  Substance Use Topics   Alcohol use: Not Currently    Comment: rare etoh    Drug use: No    Janice [name] reports that she has quit smoking. She has never used smokeless tobacco. She reports that she does not currently use alcohol. She reports that she does not use drugs.  Tobacco Cessation: Former remote smoker  7 plus pack-year smoking history, but quit in 1981.   Past surgical hx, Family hx, Social hx all reviewed.  Current Outpatient Medications on File Prior to Visit  Medication Sig   ALPRAZolam (XANAX) 0.5 MG tablet Take 0.5 mg by mouth daily as needed for anxiety.   aspirin EC 81 MG tablet Take 81 mg by mouth daily.   Biotin 5000 MCG TABS Take 5,000 mcg by mouth daily.   budesonide-formoterol (SYMBICORT) 160-4.5 MCG/ACT inhaler Inhale 2 puffs into the lungs 2 (two) times daily.   Calcium Carb-Cholecalciferol 600-5 MG-MCG TABS 1 tablet with food Orally Once a day   Cholecalciferol (VITAMIN D3 PO) Take 1 capsule by mouth daily.   ENBREL SURECLICK 50 MG/ML injection Inject 50 mg into the skin once a week.   famotidine (PEPCID) 20 MG tablet Take 20 mg by mouth daily.   folic acid (FOLVITE) 1 MG tablet Take 1 mg by mouth daily.   ipratropium-albuterol (DUONEB) 0.5-2.5 (3) MG/3ML SOLN NEBULIZE CONTENTS OF 1 VIAL EVERY 6 HOURS AS NEEDED   Multiple Vitamins-Minerals (MULTIVITAL) tablet Take 1 tablet by mouth daily.     nitroGLYCERIN (NITROSTAT) 0.4 MG SL tablet PLACE 1 TABLET (0.4 MG TOTAL) UNDER THE TONGUE EVERY 5 (FIVE) MINUTES AS NEEDED FOR CHEST PAIN.   traMADol (ULTRAM) 50 MG tablet Take 50 mg by mouth 2 (two) times daily as needed.   No current facility-administered medications on file prior to visit.      Allergies  Allergen Reactions   Erythromycin     Review Of Systems:  Constitutional:   No  weight loss, night sweats,  Fevers, chills, fatigue, or  lassitude.  HEENT:   No headaches,  Difficulty swallowing,  Tooth/dental problems, or  Sore throat,                No sneezing, itching, ear ache, nasal congestion, post nasal  drip,   CV:  No chest pain,  Orthopnea, PND, swelling in lower extremities, anasarca, dizziness, palpitations, syncope.   GI  No heartburn, indigestion, abdominal pain, nausea, vomiting, diarrhea, change in bowel habits, loss of appetite, bloody stools.   Resp: No shortness of breath with exertion or at rest.  No excess mucus, no productive cough,  No non-productive cough,  No coughing up of blood.  No change in color of mucus.  No wheezing.  No chest wall deformity  Skin: no rash or lesions.  GU: no dysuria, change in color of urine, no urgency or frequency.  No flank pain, no hematuria   MS:  No joint pain or swelling.  No decreased range of motion.  No back pain.  Psych:  No change in mood or affect. No depression or anxiety.  No memory loss.   Vital Signs BP 118/70 (BP Location: Right Arm, Cuff Size: Large)   Pulse 74   Temp 98.1 F (36.7 C)   Ht '5\' 6"'$  (1.676 m)   Wt 194 lb 6.4 oz (88.2 kg)   SpO2 98%   BMI 31.38 kg/m    Physical Exam:  General- No distress,  A&Ox3, pleasant ENT: No sinus tenderness, TM clear, pale nasal mucosa, no oral exudate,no post nasal drip, no LAN Cardiac: S1, S2, regular rate and rhythm, no murmur Chest: No wheeze/ rales/ dullness; no accessory muscle use, no nasal flaring, no sternal retractions Abd.: Soft Non-tender, ND, BS +, Body mass index is 31.38 kg/m.  Ext: No clubbing cyanosis, edema, no obvious deformities Neuro:  normal strength, MAE x 4, A&O X 3, appropriate Skin: No rashes, warm and dry, NO lesions  Psych: normal mood and behavior   Assessment/Plan Pulmonary Surgical Clearance for Hammer Toe and  [place] Surgery Plan It is good to see you today. You are at very low risk for post surgical complications. Recommendations to have best outcome are:   1. Short duration of surgery as much as possible and avoid paralytic if possible 2. Please use Symbicort the morning of surgery, rinse mouth after use.  3. DVT prophylaxis 4. Aggressive pulmonary toilet with o2, bronchodilatation, and incentive spirometry and early ambulation 5. Recommend physical therapy eval post operatively to ensure mobility as soon as able.   We will send these recommendations to Dr. Amalia Hailey, your surgeon.  Continue Symbicort 2 puffs in the morning and 2 puffs in the evening as you have been doing. Rinse mouth after use.  Follow up with Dr. Erin Fulling as needed.  Emmit Alexanders with your surgery.  Let us know if you need anything Please contact office for sooner follow up if symptoms do not improve or worsen or seek emergency care     I spent 35 minutes dedicated to the care of this patient on the date of this encounter to include pre-visit review of records, face-to-face time with the patient discussing conditions above, post visit ordering of testing, clinical documentation with the electronic health record, making appropriate referrals as documented, and communicating necessary information to the patient's healthcare team.   Magdalen Spatz, NP 04/21/2022  2:06 PM

## 2022-04-21 NOTE — Patient Instructions (Addendum)
It is good to see you today. You are at very low risk for post surgical complications. Recommendations to have best outcome are:   1. Short duration of surgery as much as possible and avoid paralytic if possible 2. Please use Symbicort the morning of surgery, rinse mouth after use.  3. DVT prophylaxis 4. Aggressive pulmonary toilet with o2, bronchodilatation, and incentive spirometry and early ambulation 5. Recommend physical therapy eval post operatively to ensure mobility as soon as able.   We will send these recommendations to Dr. Amalia Hailey, your surgeon.  Continue Symbicort 2 puffs in the morning and 2 puffs in the evening as you have been doing. Rinse mouth after use.  Follow up with Dr. Erin Fulling as needed.  Janice [name] with your surgery.  Let us know if you need anything Please contact office for sooner follow up if symptoms do not improve or worsen or seek emergency care

## 2022-04-24 ENCOUNTER — Ambulatory Visit: Payer: Medicare HMO | Admitting: Podiatry

## 2022-04-24 DIAGNOSIS — M2012 Hallux valgus (acquired), left foot: Secondary | ICD-10-CM | POA: Diagnosis not present

## 2022-04-24 NOTE — Progress Notes (Signed)
Sx left - mtp fusion. 2nd ht repair - med and pulmonology clearance    Subjective: 76 y.o. female presents today  for follow-up evaluation of a symptomatic bunion and hammertoe to the left foot.  Last visit surgery was scheduled however the patient presents today to discuss the surgery and she has multiple questions and concerns about the planned surgery.  She presents for further treatment and evaluation  Past Medical History:  Diagnosis Date   Acute myocardial infarction, unspecified site, episode of care unspecified    Anemia    Arthritis, rheumatoid (HCC)    hx   Back pain, chronic    GERD (gastroesophageal reflux disease)    Pulmonary embolism (HCC)    Scoliosis    Skin cancer    Past Surgical History:  Procedure Laterality Date   bladder tack     BUNIONECTOMY Bilateral    COLONOSCOPY W/ POLYPECTOMY     HERNIA REPAIR     RECTOCELE REPAIR     TONSILLECTOMY     TUBAL LIGATION     Allergies  Allergen Reactions   Erythromycin       Objective: Physical Exam General: The patient is alert and oriented x3 in no acute distress.  Dermatology: Skin is cool, dry and supple bilateral lower extremities. Negative for open lesions or macerations.  Vascular: Palpable pedal pulses bilaterally. No edema or erythema noted. Capillary refill within normal limits.  Neurological: Epicritic and protective threshold grossly intact bilaterally.   Musculoskeletal Exam: Clinical evidence of bunion deformity noted to the respective foot. There is moderate pain on palpation range of motion of the first MPJ. Lateral deviation of the hallux noted consistent with hallux abductovalgus.  There is also evidence of a hammertoe contracture deformity to the second digit with overlap of the third digit.  This toe is also symptomatic and rubs against her shoes.  Prominent fifth metatarsal head also noted to the left foot with lateral deviation of the metatarsal head and prominence which irritates her  foot especially in close toed shoes  Radiographic Exam B/L 04/12/2022: Increased intermetatarsal angle greater than 15 with a hallux abductus angle greater than 30 noted on AP view. Moderate degenerative changes noted within the first MPJ. Hammertoe contracture also noted with an elongated second metatarsal with subluxation at the MTP joint.  Lateral deviation of the hallux and second toe at the level of the MTP also noted  Assessment: 1. HAV w/ bunion deformity left 2.  Hammertoe second left 3.  Tailor's bunion left   Plan of Care:  1. Patient was evaluated.  2.  Patient had multiple concerns and questions regarding her left foot.  She has been researching online about neuropathy and is concern for neuropathy.  I did explain that surgery would not correct or treat her neuropathic symptoms. 3.  Patient has also researched different treatment options other than first MTP arthrodesis.  I did explain to the patient that other procedures such as arthroplasty with implant would only be beneficial and indicated if the toe is already in a rectus alignment.  This was explained to the patient. 4.  Due to the multiple concerns and questions of the foot I did recommend that she follows up with her PCP for the neuropathy.  For now we are going to cancel surgery. 5.  Return to clinic as needed  *Patient's daughter is Sharyn Lull *Patient is having hysterectomy surgery 06/06/2022.   Edrick Kins, DPM [facility]  Dr. Edrick Kins,  DPM    [address]                                        [place], [place] [address]                Office [phone]  Fax [phone]

## 2022-05-03 ENCOUNTER — Encounter: Payer: Medicare HMO | Admitting: Podiatry

## 2022-05-05 DIAGNOSIS — R338 Other retention of urine: Secondary | ICD-10-CM | POA: Diagnosis not present

## 2022-05-05 DIAGNOSIS — N393 Stress incontinence (female) (male): Secondary | ICD-10-CM | POA: Diagnosis not present

## 2022-05-05 DIAGNOSIS — N813 Complete uterovaginal prolapse: Secondary | ICD-10-CM | POA: Diagnosis not present

## 2022-05-10 ENCOUNTER — Encounter: Payer: Medicare HMO | Admitting: Podiatry

## 2022-05-16 ENCOUNTER — Other Ambulatory Visit: Payer: Self-pay | Admitting: Pulmonary Disease

## 2022-05-16 DIAGNOSIS — J470 Bronchiectasis with acute lower respiratory infection: Secondary | ICD-10-CM

## 2022-05-18 DIAGNOSIS — M5136 Other intervertebral disc degeneration, lumbar region: Secondary | ICD-10-CM | POA: Diagnosis not present

## 2022-05-18 DIAGNOSIS — I2510 Atherosclerotic heart disease of native coronary artery without angina pectoris: Secondary | ICD-10-CM | POA: Diagnosis not present

## 2022-05-18 DIAGNOSIS — M810 Age-related osteoporosis without current pathological fracture: Secondary | ICD-10-CM | POA: Diagnosis not present

## 2022-05-18 DIAGNOSIS — Z86711 Personal history of pulmonary embolism: Secondary | ICD-10-CM | POA: Diagnosis not present

## 2022-05-18 DIAGNOSIS — M0589 Other rheumatoid arthritis with rheumatoid factor of multiple sites: Secondary | ICD-10-CM | POA: Diagnosis not present

## 2022-05-18 DIAGNOSIS — M150 Primary generalized (osteo)arthritis: Secondary | ICD-10-CM | POA: Diagnosis not present

## 2022-05-18 DIAGNOSIS — R5383 Other fatigue: Secondary | ICD-10-CM | POA: Diagnosis not present

## 2022-05-18 DIAGNOSIS — M0630 Rheumatoid nodule, unspecified site: Secondary | ICD-10-CM | POA: Diagnosis not present

## 2022-05-18 DIAGNOSIS — Z79899 Other long term (current) drug therapy: Secondary | ICD-10-CM | POA: Diagnosis not present

## 2022-05-18 DIAGNOSIS — D649 Anemia, unspecified: Secondary | ICD-10-CM | POA: Diagnosis not present

## 2022-05-25 DIAGNOSIS — N813 Complete uterovaginal prolapse: Secondary | ICD-10-CM | POA: Diagnosis not present

## 2022-05-25 DIAGNOSIS — Z01812 Encounter for preprocedural laboratory examination: Secondary | ICD-10-CM | POA: Diagnosis not present

## 2022-05-25 DIAGNOSIS — N393 Stress incontinence (female) (male): Secondary | ICD-10-CM | POA: Diagnosis not present

## 2022-05-25 DIAGNOSIS — R339 Retention of urine, unspecified: Secondary | ICD-10-CM | POA: Diagnosis not present

## 2022-05-27 ENCOUNTER — Encounter: Payer: Self-pay | Admitting: Oncology

## 2022-05-29 ENCOUNTER — Encounter: Payer: Medicare HMO | Admitting: Podiatry

## 2022-05-30 ENCOUNTER — Encounter: Payer: Self-pay | Admitting: Oncology

## 2022-05-30 ENCOUNTER — Inpatient Hospital Stay: Payer: Medicare HMO | Attending: Oncology

## 2022-05-30 ENCOUNTER — Inpatient Hospital Stay: Payer: Medicare HMO

## 2022-05-30 ENCOUNTER — Other Ambulatory Visit: Payer: Self-pay | Admitting: Pharmacist

## 2022-05-30 ENCOUNTER — Telehealth: Payer: Self-pay | Admitting: Oncology

## 2022-05-30 VITALS — BP 178/70 | HR 71 | Temp 97.6°F | Resp 18

## 2022-05-30 DIAGNOSIS — D649 Anemia, unspecified: Secondary | ICD-10-CM | POA: Diagnosis not present

## 2022-05-30 DIAGNOSIS — D509 Iron deficiency anemia, unspecified: Secondary | ICD-10-CM | POA: Insufficient documentation

## 2022-05-30 DIAGNOSIS — D508 Other iron deficiency anemias: Secondary | ICD-10-CM

## 2022-05-30 LAB — IRON AND TIBC
Iron: 24 ug/dL — ABNORMAL LOW (ref 28–170)
Saturation Ratios: 6 % — ABNORMAL LOW (ref 10.4–31.8)
TIBC: 438 ug/dL (ref 250–450)
UIBC: 414 ug/dL

## 2022-05-30 LAB — FERRITIN: Ferritin: 10 ng/mL — ABNORMAL LOW (ref 11–307)

## 2022-05-30 LAB — CBC AND DIFFERENTIAL
HCT: 34 — AB (ref 36–46)
Hemoglobin: 10.7 — AB (ref 12.0–16.0)
Neutrophils Absolute: 7.06
Platelets: 387 10*3/uL (ref 150–400)
WBC: 9.8

## 2022-05-30 LAB — CBC: RBC: 4.38 (ref 3.87–5.11)

## 2022-05-30 MED ORDER — SODIUM CHLORIDE 0.9 % IV SOLN
200.0000 mg | Freq: Once | INTRAVENOUS | Status: AC
  Administered 2022-05-30: 200 mg via INTRAVENOUS
  Filled 2022-05-30: qty 200

## 2022-05-30 MED ORDER — SODIUM CHLORIDE 0.9 % IV SOLN: Freq: Once | INTRAVENOUS | Status: AC

## 2022-05-30 MED FILL — Iron Sucrose Inj 20 MG/ML (Fe Equiv): INTRAVENOUS | Qty: 10 | Status: AC

## 2022-05-30 NOTE — Progress Notes (Signed)
Patient reports that she has stopped Aspirin for upcoming surgery

## 2022-05-30 NOTE — Telephone Encounter (Signed)
05/30/22 Spoke with patient and scheduled Lab and IV Iron.

## 2022-05-30 NOTE — Patient Instructions (Signed)

## 2022-05-31 ENCOUNTER — Inpatient Hospital Stay: Payer: Medicare HMO

## 2022-05-31 VITALS — BP 148/67 | HR 78 | Temp 97.9°F | Resp 16

## 2022-05-31 DIAGNOSIS — D509 Iron deficiency anemia, unspecified: Secondary | ICD-10-CM | POA: Diagnosis not present

## 2022-05-31 DIAGNOSIS — D508 Other iron deficiency anemias: Secondary | ICD-10-CM

## 2022-05-31 MED ORDER — SODIUM CHLORIDE 0.9 % IV SOLN: Freq: Once | INTRAVENOUS | Status: AC

## 2022-05-31 MED ORDER — SODIUM CHLORIDE 0.9 % IV SOLN
200.0000 mg | Freq: Once | INTRAVENOUS | Status: AC
  Administered 2022-05-31: 200 mg via INTRAVENOUS
  Filled 2022-05-31: qty 200

## 2022-05-31 MED FILL — Iron Sucrose Inj 20 MG/ML (Fe Equiv): INTRAVENOUS | Qty: 10 | Status: AC

## 2022-05-31 NOTE — Patient Instructions (Signed)

## 2022-06-01 ENCOUNTER — Other Ambulatory Visit: Payer: Medicare HMO

## 2022-06-01 ENCOUNTER — Inpatient Hospital Stay: Payer: Medicare HMO

## 2022-06-01 ENCOUNTER — Ambulatory Visit: Payer: Medicare HMO | Admitting: Oncology

## 2022-06-01 VITALS — BP 143/78 | HR 69 | Temp 98.1°F | Resp 16

## 2022-06-01 DIAGNOSIS — D508 Other iron deficiency anemias: Secondary | ICD-10-CM

## 2022-06-01 DIAGNOSIS — D509 Iron deficiency anemia, unspecified: Secondary | ICD-10-CM | POA: Diagnosis not present

## 2022-06-01 MED ORDER — SODIUM CHLORIDE 0.9 % IV SOLN
200.0000 mg | Freq: Once | INTRAVENOUS | Status: AC
  Administered 2022-06-01: 200 mg via INTRAVENOUS
  Filled 2022-06-01: qty 200

## 2022-06-01 MED ORDER — SODIUM CHLORIDE 0.9 % IV SOLN: Freq: Once | INTRAVENOUS | Status: AC

## 2022-06-01 MED ORDER — HEPARIN SOD (PORK) LOCK FLUSH 100 UNIT/ML IV SOLN: 500.0000 [IU] | Freq: Once | INTRAVENOUS | Status: DC | PRN

## 2022-06-01 MED ORDER — ALTEPLASE 2 MG IJ SOLR: 2.0000 mg | Freq: Once | INTRAMUSCULAR | Status: DC | PRN

## 2022-06-01 MED FILL — Iron Sucrose Inj 20 MG/ML (Fe Equiv): INTRAVENOUS | Qty: 10 | Status: AC

## 2022-06-01 NOTE — Patient Instructions (Signed)

## 2022-06-02 ENCOUNTER — Inpatient Hospital Stay: Payer: Medicare HMO

## 2022-06-02 VITALS — BP 155/76 | HR 72 | Temp 98.1°F | Resp 18

## 2022-06-02 DIAGNOSIS — D508 Other iron deficiency anemias: Secondary | ICD-10-CM

## 2022-06-02 DIAGNOSIS — D509 Iron deficiency anemia, unspecified: Secondary | ICD-10-CM | POA: Diagnosis not present

## 2022-06-02 MED ORDER — SODIUM CHLORIDE 0.9 % IV SOLN
200.0000 mg | Freq: Once | INTRAVENOUS | Status: AC
  Administered 2022-06-02: 200 mg via INTRAVENOUS
  Filled 2022-06-02: qty 200

## 2022-06-02 MED ORDER — SODIUM CHLORIDE 0.9 % IV SOLN: Freq: Once | INTRAVENOUS | Status: AC

## 2022-06-02 MED FILL — Iron Sucrose Inj 20 MG/ML (Fe Equiv): INTRAVENOUS | Qty: 10 | Status: AC

## 2022-06-02 NOTE — Patient Instructions (Signed)

## 2022-06-05 ENCOUNTER — Inpatient Hospital Stay: Payer: Medicare HMO

## 2022-06-05 VITALS — BP 135/68 | HR 78 | Temp 98.3°F | Resp 18

## 2022-06-05 DIAGNOSIS — D508 Other iron deficiency anemias: Secondary | ICD-10-CM

## 2022-06-05 DIAGNOSIS — D509 Iron deficiency anemia, unspecified: Secondary | ICD-10-CM | POA: Diagnosis not present

## 2022-06-05 MED ORDER — SODIUM CHLORIDE 0.9 % IV SOLN
200.0000 mg | Freq: Once | INTRAVENOUS | Status: AC
  Administered 2022-06-05: 200 mg via INTRAVENOUS
  Filled 2022-06-05: qty 200

## 2022-06-05 MED ORDER — SODIUM CHLORIDE 0.9 % IV SOLN: Freq: Once | INTRAVENOUS | Status: AC

## 2022-06-05 NOTE — Patient Instructions (Signed)

## 2022-06-06 DIAGNOSIS — N83291 Other ovarian cyst, right side: Secondary | ICD-10-CM | POA: Diagnosis not present

## 2022-06-06 DIAGNOSIS — N888 Other specified noninflammatory disorders of cervix uteri: Secondary | ICD-10-CM | POA: Diagnosis not present

## 2022-06-06 DIAGNOSIS — R339 Retention of urine, unspecified: Secondary | ICD-10-CM | POA: Diagnosis not present

## 2022-06-06 DIAGNOSIS — N840 Polyp of corpus uteri: Secondary | ICD-10-CM | POA: Diagnosis not present

## 2022-06-06 DIAGNOSIS — N813 Complete uterovaginal prolapse: Secondary | ICD-10-CM | POA: Diagnosis not present

## 2022-06-06 DIAGNOSIS — N323 Diverticulum of bladder: Secondary | ICD-10-CM | POA: Diagnosis not present

## 2022-06-06 DIAGNOSIS — N393 Stress incontinence (female) (male): Secondary | ICD-10-CM | POA: Diagnosis not present

## 2022-06-16 DIAGNOSIS — N3941 Urge incontinence: Secondary | ICD-10-CM | POA: Diagnosis not present

## 2022-06-16 DIAGNOSIS — N390 Urinary tract infection, site not specified: Secondary | ICD-10-CM | POA: Diagnosis not present

## 2022-06-16 DIAGNOSIS — N3946 Mixed incontinence: Secondary | ICD-10-CM | POA: Diagnosis not present

## 2022-06-16 DIAGNOSIS — B9620 Unspecified Escherichia coli [E. coli] as the cause of diseases classified elsewhere: Secondary | ICD-10-CM | POA: Diagnosis not present

## 2022-06-16 DIAGNOSIS — N813 Complete uterovaginal prolapse: Secondary | ICD-10-CM | POA: Diagnosis not present

## 2022-07-12 ENCOUNTER — Other Ambulatory Visit: Payer: Self-pay | Admitting: Pulmonary Disease

## 2022-07-12 DIAGNOSIS — J470 Bronchiectasis with acute lower respiratory infection: Secondary | ICD-10-CM

## 2022-07-30 NOTE — Progress Notes (Incomplete)
[place]  [address] [place],  [place]  [address] [phone]  Clinic Day:  07/30/2022  Referring physician: Gweneth Fritter, FNP   HISTORY OF PRESENT ILLNESS:  The patient is a 77 y.o. female  who I was asked to consult upon for iron deficiency anemia.  Recent labs showed a low hemoglobin of 8.8, with a low MCV of 80.  Iron studies done recently showed a low ferritin of 9.5, a low serum iron of 11, a TIBC of 455, and a low iron saturation of 2%.  According to the patient, she has had some spotty vaginal bleeding which she attributes to her pessary being placed 3 months ago.  She also believes she has had occasional rectal bleeding, which she attributes to external hemorrhoids.  She denies having other overt forms of blood loss.  She claims to have had a colonoscopy 5 years ago for which only polyps were seen and removed.  She has had ice cravings over the past 2 months.  This, in conjunction with her weakness, had her concerned about iron deficiency anemia being an issue.  Of note, she did need a blood transfusion last year due to anemia.  She recalls her mother having anemia.  To her knowledge, there is no other family history of anemia or other hematologic disorders.  PAST MEDICAL HISTORY:   Past Medical History:  Diagnosis Date  . Acute myocardial infarction, unspecified site, episode of care unspecified   . Anemia   . Arthritis, rheumatoid (HCC)    hx  . Back pain, chronic   . GERD (gastroesophageal reflux disease)   . Pulmonary embolism ([facility])   . Scoliosis   . Skin cancer     PAST SURGICAL HISTORY:   Past Surgical History:  Procedure Laterality Date  . bladder tack    . BUNIONECTOMY Bilateral   . COLONOSCOPY W/ POLYPECTOMY    . HERNIA REPAIR    . RECTOCELE REPAIR    . TONSILLECTOMY    . TUBAL LIGATION      CURRENT MEDICATIONS:   Current Outpatient Medications  Medication Sig Dispense Refill  . ALPRAZolam (XANAX) 0.5 MG tablet  Take 0.5 mg by mouth daily as needed for anxiety.    Marland Kitchen aspirin EC 81 MG tablet Take 81 mg by mouth daily.    . Biotin 5000 MCG TABS Take 5,000 mcg by mouth daily.    . budesonide-formoterol (SYMBICORT) 160-4.5 MCG/ACT inhaler Inhale 2 puffs into the lungs 2 (two) times daily. 1 each 6  . Calcium Carb-Cholecalciferol 600-5 MG-MCG TABS 1 tablet with food Orally Once a day    . Cholecalciferol (VITAMIN D3 PO) Take 1 capsule by mouth daily.    . clotrimazole (MYCELEX) 10 MG troche Suck and swallow one troche five times daily.    Scarlette Shorts SURECLICK 50 MG/ML injection Inject 50 mg into the skin once a week.    . estradiol (ESTRACE) 0.1 MG/GM vaginal cream SMARTSIG:1 sparingly Vaginal Every Night    . famotidine (PEPCID) 20 MG tablet Take 20 mg by mouth daily.    . folic acid (FOLVITE) 1 MG tablet Take 1 mg by mouth daily.    Marland Kitchen HUMIRA 40 MG/0.8ML PSKT Inject into the skin.    Marland Kitchen ipratropium-albuterol (DUONEB) 0.5-2.5 (3) MG/3ML SOLN NEBULIZE CONTENTS OF 1 VIAL EVERY 6 HOURS AS NEEDED 360 mL 1  . Multiple Vitamin (QUINTABS) TABS Take 1 tablet by mouth at bedtime.    . Multiple Vitamins-Minerals (  MULTIVITAL) tablet Take 1 tablet by mouth daily.      . nitroGLYCERIN (NITROSTAT) 0.4 MG SL tablet PLACE 1 TABLET (0.4 MG TOTAL) UNDER THE TONGUE EVERY 5 (FIVE) MINUTES AS NEEDED FOR CHEST PAIN. 25 tablet 3  . pantoprazole (PROTONIX) 40 MG tablet Take 1 tablet by mouth at bedtime.    . traMADol (ULTRAM) 50 MG tablet Take 50 mg by mouth 2 (two) times daily as needed.     No current facility-administered medications for this visit.    ALLERGIES:   Allergies  Allergen Reactions  . Erythromycin     FAMILY HISTORY:   Family History  Problem Relation Age of Onset  . Lung cancer Mother   . Esophageal cancer Mother   . Colon cancer Maternal Grandmother     SOCIAL HISTORY:  The patient was born and raised in [place].  She currently lives in [place] with her husband of 83 years.  They have 3  children, 1 of whom is deceased.  They also have 6 grandchildren.  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  She smoked a half a pack of cigarettes daily for 15 years before quitting 42 years ago.  There is no history of alcohol abuse.  REVIEW OF SYSTEMS:  Review of Systems  Constitutional:  Negative for fatigue and fever.  HENT:   Negative for hearing loss and sore throat.   Eyes:  Negative for eye problems.  Respiratory:  Positive for cough and shortness of breath. Negative for chest tightness and hemoptysis.   Cardiovascular:  Negative for chest pain and palpitations.  Gastrointestinal:  Positive for blood in stool. Negative for abdominal distention, abdominal pain, constipation, diarrhea, nausea and vomiting.  Endocrine: Negative for hot flashes.  Genitourinary:  Negative for difficulty urinating, dysuria, frequency, hematuria and nocturia.   Musculoskeletal:  Positive for arthralgias. Negative for back pain, gait problem and myalgias.  Skin: Negative.  Negative for itching and rash.  Neurological: Negative.  Negative for dizziness, extremity weakness, gait problem, headaches, light-headedness and numbness.  Hematological: Negative.   Psychiatric/Behavioral: Negative.  Negative for depression and suicidal ideas. The patient is not nervous/anxious.     PHYSICAL EXAM:  There were no vitals taken for this visit. Wt Readings from Last 3 Encounters:  04/21/22 194 lb 6.4 oz (88.2 kg)  04/17/22 196 lb (88.9 kg)  03/15/22 194 lb (88 kg)   There is no height or weight on file to calculate BMI. Performance status (ECOG): 1 - Symptomatic but completely ambulatory Physical Exam Constitutional:      Appearance: Normal appearance. She is not ill-appearing.  HENT:     Mouth/Throat:     Mouth: Mucous membranes are moist.     Pharynx: Oropharynx is clear. No oropharyngeal exudate or posterior oropharyngeal erythema.  Cardiovascular:     Rate and Rhythm: Normal rate and  regular rhythm.     Heart sounds: No murmur heard.    No friction rub. No gallop.  Pulmonary:     Effort: Pulmonary effort is normal. No respiratory distress.     Breath sounds: Normal breath sounds. No wheezing, rhonchi or rales.  Abdominal:     General: Bowel sounds are normal. There is no distension.     Palpations: Abdomen is soft. There is no mass.     Tenderness: There is no abdominal tenderness.  Musculoskeletal:        General: No swelling.     Right lower leg: No edema.  Left lower leg: No edema.  Lymphadenopathy:     Cervical: No cervical adenopathy.     Upper Body:     Right upper body: No supraclavicular or axillary adenopathy.     Left upper body: No supraclavicular or axillary adenopathy.     Lower Body: No right inguinal adenopathy. No left inguinal adenopathy.  Skin:    General: Skin is warm.     Coloration: Skin is not jaundiced.     Findings: No lesion or rash.  Neurological:     General: No focal deficit present.     Mental Status: She is alert and oriented to person, place, and time. Mental status is at baseline.  Psychiatric:        Mood and Affect: Mood normal.        Behavior: Behavior normal.        Thought Content: Thought content normal.   LABS:      Latest Ref Rng & Units 05/30/2022    1:49 PM 03/02/2022   12:00 AM 11/02/2021   11:48 AM  CBC  WBC  9.8     16.0     6.1   Hemoglobin 12.0 - 16.0 10.7     9.3     10.8   Hematocrit 36 - 46 34     31     34.2   Platelets 150 - 400 K/uL 387     732     535.0      This result is from an external source.       Latest Ref Rng & Units 03/02/2022   12:00 AM 11/02/2021   11:48 AM 05/16/2009   10:47 AM  CMP  Glucose 70 - 99 mg/dL  114  113   BUN 4 - 21 17     17  14   $ Creatinine 0.5 - 1.1 0.7     0.71  0.96   Sodium 137 - 147 137     138  145   Potassium 3.5 - 5.1 mEq/L 4.4     4.0  4.1   Chloride 99 - 108 103     102  110   CO2 13 - 22 25     25  28   $ Calcium 8.7 - 10.7 9.7     9.8  9.4    Total Protein 6.0 - 8.3 g/dL   6.8   Total Bilirubin 0.3 - 1.2 mg/dL   0.5   Alkaline Phos 25 - 125 34      40   AST 13 - 35 28      42   ALT 7 - 35 U/L 19      35      This result is from an external source.     Latest Reference Range & Units 03/02/22 14:05  Iron 28 - 170 ug/dL 14 (L)  UIBC ug/dL 476  TIBC 250 - 450 ug/dL 490 (H)  Saturation Ratios 10.4 - 31.8 % 3 (L)  Ferritin 11 - 307 ng/mL 7 (L)  Folate >5.9 ng/mL 32.6  Vitamin B12 180 - 914 pg/mL 2,739 (H)  (L): Data is abnormally low (H): Data is abnormally high  ASSESSMENT & PLAN:  A 77 y.o. female who I was asked to consult upon for iron deficiency anemia.  I will arrange for her to receive IV iron over these next few weeks to rapidly replenish her iron stores and normalize her hemoglobin.  I do believe she warrants  a GI work-up to ensure no ominous GI tract process is behind her iron deficiency anemia.  I will refer her to Dr. Lyndel Safe for further evaluation.  Otherwise, I will see her back in 3 months to reassess her iron and hemoglobin levels to see how well she responded to her upcoming IV iron.  The patient understands all the plans discussed today and is in agreement with them.  I do appreciate Goins, Gillis Santa, FNP for his new consult.   Deontrey Massi Macarthur Critchley, MD

## 2022-07-31 ENCOUNTER — Ambulatory Visit: Payer: Medicare HMO | Admitting: Oncology

## 2022-07-31 ENCOUNTER — Inpatient Hospital Stay: Payer: Medicare HMO

## 2022-08-04 DIAGNOSIS — R800 Isolated proteinuria: Secondary | ICD-10-CM | POA: Diagnosis not present

## 2022-08-04 DIAGNOSIS — R82998 Other abnormal findings in urine: Secondary | ICD-10-CM | POA: Diagnosis not present

## 2022-08-04 DIAGNOSIS — N3281 Overactive bladder: Secondary | ICD-10-CM | POA: Diagnosis not present

## 2022-08-04 DIAGNOSIS — B9620 Unspecified Escherichia coli [E. coli] as the cause of diseases classified elsewhere: Secondary | ICD-10-CM | POA: Diagnosis not present

## 2022-08-04 DIAGNOSIS — R300 Dysuria: Secondary | ICD-10-CM | POA: Diagnosis not present

## 2022-08-04 DIAGNOSIS — N952 Postmenopausal atrophic vaginitis: Secondary | ICD-10-CM | POA: Diagnosis not present

## 2022-08-04 DIAGNOSIS — N390 Urinary tract infection, site not specified: Secondary | ICD-10-CM | POA: Diagnosis not present

## 2022-08-04 DIAGNOSIS — N393 Stress incontinence (female) (male): Secondary | ICD-10-CM | POA: Diagnosis not present

## 2022-08-06 NOTE — Progress Notes (Unsigned)
[place]  [address] [place],  [place]  [address] [phone]  Clinic Day:  08/07/2022  Referring physician: Gweneth Fritter, FNP   HISTORY OF PRESENT ILLNESS:  The patient is a 77 y.o. female  with iron deficiency anemia.  She comes in today to reassess her iron and hemoglobin levels after receiving IV iron.  Since receiving her iron, she has felt much better.  She continues to deny having any overt forms of blood loss to explain her previous iron deficiency anemia.    PHYSICAL EXAM:  Blood pressure (!) 169/82, pulse 82, temperature 97.8 F (36.6 C), resp. rate 14, height 5' 6"$  (1.676 m), weight 194 lb 9.6 oz (88.3 kg), SpO2 95 %. Wt Readings from Last 3 Encounters:  08/07/22 194 lb 9.6 oz (88.3 kg)  04/21/22 194 lb 6.4 oz (88.2 kg)  04/17/22 196 lb (88.9 kg)   Body mass index is 31.41 kg/m. Performance status (ECOG): 1 - Symptomatic but completely ambulatory Physical Exam Constitutional:      Appearance: Normal appearance. She is not ill-appearing.  HENT:     Mouth/Throat:     Mouth: Mucous membranes are moist.     Pharynx: Oropharynx is clear. No oropharyngeal exudate or posterior oropharyngeal erythema.  Cardiovascular:     Rate and Rhythm: Normal rate and regular rhythm.     Heart sounds: No murmur heard.    No friction rub. No gallop.  Pulmonary:     Effort: Pulmonary effort is normal. No respiratory distress.     Breath sounds: Normal breath sounds. No wheezing, rhonchi or rales.  Abdominal:     General: Bowel sounds are normal. There is no distension.     Palpations: Abdomen is soft. There is no mass.     Tenderness: There is no abdominal tenderness.  Musculoskeletal:        General: No swelling.     Right lower leg: No edema.     Left lower leg: No edema.  Lymphadenopathy:     Cervical: No cervical adenopathy.     Upper Body:     Right upper body: No supraclavicular or axillary adenopathy.     Left upper body: No  supraclavicular or axillary adenopathy.     Lower Body: No right inguinal adenopathy. No left inguinal adenopathy.  Skin:    General: Skin is warm.     Coloration: Skin is not jaundiced.     Findings: No lesion or rash.  Neurological:     General: No focal deficit present.     Mental Status: She is alert and oriented to person, place, and time. Mental status is at baseline.  Psychiatric:        Mood and Affect: Mood normal.        Behavior: Behavior normal.        Thought Content: Thought content normal.    LABS:      Latest Ref Rng & Units 08/07/2022   12:00 AM 05/30/2022    1:49 PM 03/02/2022   12:00 AM  CBC  WBC  9.4     9.8     16.0      Hemoglobin 12.0 - 16.0 12.5     10.7     9.3      Hematocrit 36 - 46 39     34     31      Platelets 150 - 400 K/uL 344     387  732         This result is from an external source.      Latest Ref Rng & Units 03/02/2022   12:00 AM 11/02/2021   11:48 AM 05/16/2009   10:47 AM  CMP  Glucose 70 - 99 mg/dL  114  113   BUN 4 - 21 17     17  14   $ Creatinine 0.5 - 1.1 0.7     0.71  0.96   Sodium 137 - 147 137     138  145   Potassium 3.5 - 5.1 mEq/L 4.4     4.0  4.1   Chloride 99 - 108 103     102  110   CO2 13 - 22 25     25  28   $ Calcium 8.7 - 10.7 9.7     9.8  9.4   Total Protein 6.0 - 8.3 g/dL   6.8   Total Bilirubin 0.3 - 1.2 mg/dL   0.5   Alkaline Phos 25 - 125 34      40   AST 13 - 35 28      42   ALT 7 - 35 U/L 19      35      This result is from an external source.     Latest Reference Range & Units Most Recent 05/30/22 13:48  Iron 28 - 170 ug/dL 41 08/07/22 13:59 24 (L)  UIBC ug/dL 365 08/07/22 13:59 414  TIBC 250 - 450 ug/dL 406 08/07/22 13:59 438  Saturation Ratios 10.4 - 31.8 % 10 (L) 08/07/22 13:59 6 (L)  Ferritin 11 - 307 ng/mL 36 08/07/22 13:59 10 (L)  (L): Data is abnormally low  ASSESSMENT & PLAN:  A 77 y.o. female who I recently began seeing for iron deficiency anemia.  I am pleased with the significant  improvement in both her iron and hemoglobin levels.  Clinically, she appears to be doing well.  I will see her back in 6 months for repeat clinical assessment.  The patient understands all the plans discussed today and is in agreement with them.  Jency Schnieders Macarthur Critchley, MD

## 2022-08-07 ENCOUNTER — Other Ambulatory Visit: Payer: Self-pay | Admitting: Oncology

## 2022-08-07 ENCOUNTER — Inpatient Hospital Stay: Payer: Medicare HMO | Admitting: Oncology

## 2022-08-07 ENCOUNTER — Inpatient Hospital Stay: Payer: Medicare HMO | Attending: Oncology

## 2022-08-07 VITALS — BP 169/82 | HR 82 | Temp 97.8°F | Resp 14 | Ht 66.0 in | Wt 194.6 lb

## 2022-08-07 DIAGNOSIS — D508 Other iron deficiency anemias: Secondary | ICD-10-CM

## 2022-08-07 DIAGNOSIS — D509 Iron deficiency anemia, unspecified: Secondary | ICD-10-CM | POA: Insufficient documentation

## 2022-08-07 DIAGNOSIS — D649 Anemia, unspecified: Secondary | ICD-10-CM | POA: Diagnosis not present

## 2022-08-07 LAB — CBC AND DIFFERENTIAL
HCT: 39 (ref 36–46)
Hemoglobin: 12.5 (ref 12.0–16.0)
Neutrophils Absolute: 6.58
Platelets: 344 10*3/uL (ref 150–400)
WBC: 9.4

## 2022-08-07 LAB — IRON AND TIBC
Iron: 41 ug/dL (ref 28–170)
Saturation Ratios: 10 % — ABNORMAL LOW (ref 10.4–31.8)
TIBC: 406 ug/dL (ref 250–450)
UIBC: 365 ug/dL

## 2022-08-07 LAB — FERRITIN: Ferritin: 36 ng/mL (ref 11–307)

## 2022-08-07 LAB — CBC: RBC: 4.6 (ref 3.87–5.11)

## 2022-08-08 ENCOUNTER — Telehealth: Payer: Self-pay | Admitting: Oncology

## 2022-08-08 ENCOUNTER — Encounter: Payer: Self-pay | Admitting: Physical Medicine and Rehabilitation

## 2022-08-08 NOTE — Telephone Encounter (Signed)
08/08/22 Spoke with patient and scheduled 29mh f/u appt

## 2022-08-18 ENCOUNTER — Encounter: Payer: Self-pay | Admitting: Oncology

## 2022-08-25 ENCOUNTER — Encounter: Payer: Medicare HMO | Admitting: Physical Medicine and Rehabilitation

## 2022-09-05 DIAGNOSIS — Z111 Encounter for screening for respiratory tuberculosis: Secondary | ICD-10-CM | POA: Diagnosis not present

## 2022-09-05 DIAGNOSIS — Z7960 Long term (current) use of unspecified immunomodulators and immunosuppressants: Secondary | ICD-10-CM | POA: Diagnosis not present

## 2022-09-05 DIAGNOSIS — M810 Age-related osteoporosis without current pathological fracture: Secondary | ICD-10-CM | POA: Diagnosis not present

## 2022-09-05 DIAGNOSIS — M0579 Rheumatoid arthritis with rheumatoid factor of multiple sites without organ or systems involvement: Secondary | ICD-10-CM | POA: Diagnosis not present

## 2022-09-29 ENCOUNTER — Other Ambulatory Visit: Payer: Self-pay | Admitting: Pulmonary Disease

## 2022-09-29 DIAGNOSIS — J470 Bronchiectasis with acute lower respiratory infection: Secondary | ICD-10-CM

## 2022-11-07 DIAGNOSIS — M0579 Rheumatoid arthritis with rheumatoid factor of multiple sites without organ or systems involvement: Secondary | ICD-10-CM | POA: Diagnosis not present

## 2022-11-07 DIAGNOSIS — Z7960 Long term (current) use of unspecified immunomodulators and immunosuppressants: Secondary | ICD-10-CM | POA: Diagnosis not present

## 2022-11-07 DIAGNOSIS — M810 Age-related osteoporosis without current pathological fracture: Secondary | ICD-10-CM | POA: Diagnosis not present

## 2022-11-07 DIAGNOSIS — M159 Polyosteoarthritis, unspecified: Secondary | ICD-10-CM | POA: Diagnosis not present

## 2022-11-09 ENCOUNTER — Ambulatory Visit: Payer: Medicare HMO | Admitting: Pulmonary Disease

## 2022-11-09 ENCOUNTER — Encounter: Payer: Self-pay | Admitting: Pulmonary Disease

## 2022-11-09 VITALS — BP 112/74 | HR 75 | Temp 97.6°F | Ht 64.0 in | Wt 190.4 lb

## 2022-11-09 DIAGNOSIS — J479 Bronchiectasis, uncomplicated: Secondary | ICD-10-CM | POA: Diagnosis not present

## 2022-11-09 NOTE — Patient Instructions (Signed)
I am glad you have been feeling well  Follow up in 1 year with CT Chest scan and pulmonary function tests

## 2022-11-09 NOTE — Progress Notes (Signed)
Synopsis: Referred in May 2023 for Acute visit  Subjective:   PATIENT ID: Janice [name] GENDER: female DOB: Oct 14, 1945, MRN: [id number]  HPI  Chief Complaint  Patient presents with   Follow-up    Doing well, no sx noted.   Janice [name] is a 77 year old woman, former smoker with rheumatoid arthritis on methotrexate and enbrel and bronchiectasis who returns to pulmonary clinic for follow up visit.   She was last seen 04/21/22 by Kandice Robinsons, NP for pre-operative evaluation. No acute issues at that time.  She is feeling well today, no cough or mucous production. No dyspnea or wheezing. She is on Rinvoq 15mg  daily for her RA. She is off humira and enbrel.    OV 02/22/22 She completed steroid taper on 8/15 for pleural effusion possibly related to RA flare.   She reports increasing dyspnea over the past month since stopping her steroid taper. She also reports her rheumatoid arthritis is flaring at this time with diffuse joint pains of the shoulders, wrists and ankles. She was seen by rheumatoogy 2 weeks ago where she reports blood work showed iron deficiency and is working with her primary care on starting iron supplementation.   She remains on enbrel for her RA. She is not on methotrexate anymore.   Along with the progressive dyspnea, she reports increase in wheezing. She also reports having chills last Wednesday night and Thursday morning. She is coughing up more mucous. She has run out of duoneb solution and is waiting on her mail order from the pharmacy. She has stopped using anoro due to cough.   She had labs done 2 weeks ago at [facility].   She does report an aspiration episode recently.  OV 12/30/21 She was tapered down from 40mg  daily at last visit to 20mg  daily. She has been on bactrim 3 times weekly. She was started on duoneb treatments and flutter valve therapy for her bronchiectasis.   She reports her right sided pain has resolved and she denies  cough, sputum production, fevers or night sweats..  She is followed by Dr. Kathi Ludwig of Rheumatology. She has resumed enbrel and denies any joint aches at this time.   She had MBS performed at [facility] with evidence of mild oropharyngeal dysphagia.   OV 11/21/21 She was scheduled for thoracentesis 11/04/21 but based on pleural [place], the effusion was very small and complicated with loculations and septations. Dr. Karie Fetch viewed the pleural space as well and deemed it too risky for procedure so no thoracentesis was performed.  She was started on 40mg  of prednisone for concern of rheuamtoid associated pleural effusion given she had been off her rheumatoid arthritis treatment. She was also treated with augmentin for concern of possible parapneumonic effusion in setting of aspiration. MBS was performed at [facility].   Labs 5/17 showed hemoglobin 10.8g/dL, peripheral eosinophil count of 400, ANA 1:320 speckled pattern, CCP >250 and RF 354.   She reports resolution of the right sided chest pain and night sweats since completing 14 day course of augmentin and starting 40mg  of prednisone daily. She has resumed her methotrexate and enbrel for her RA. Her joint pains are much better.   She now has increased cough over the past 4-5 days and shortness of breath.  She remains on symbicort 160-4.4mcg 2 puffs twice daily.   Initial OV 11/02/21 She has progressive dyspnea over the past [redacted] weeks along with occasional night sweats that require her to change her clothes/sheets.  She denies fevers and chills. She complained of right sided flank pain and anterior chest pain that was worse with cough and deep inspiration. She had CT abdominal [facility] at [facility] that noted basilar infiltrates, bronchiectasis and loculated right pleural effusion. She was started on doxycycline which has helped improve her symptoms somewhat. She most recently had night sweats last night. She reports increase symptoms with trouble  swallowing solids over the past month.   She has history of rheumatoid arthritis which tends to affect her hands mostly.   She has been on symbiocrt 160-4.43mcg for her breathing.   She is accompanied by her daughter.    Past Medical History:  Diagnosis Date   Acute myocardial infarction, unspecified site, episode of care unspecified    Anemia    Arthritis, rheumatoid (HCC)    hx   Back pain, chronic    GERD (gastroesophageal reflux disease)    Pulmonary embolism (HCC)    Scoliosis    Skin cancer      Family History  Problem Relation Age of Onset   Lung cancer Mother    Esophageal cancer Mother    Colon cancer Maternal Grandmother      Social History   Socioeconomic History   Marital status: Married    Spouse name: LARRY   Number of children: 4   Years of education: 12   Highest education level: Not on file  Occupational History   Occupation: RETIRED - [employer]  Tobacco Use   Smoking status: Former   Smokeless tobacco: Never   Tobacco comments:    has 7 plus pack-year smoking history, but quit in 1981.   Vaping Use   Vaping Use: Never used  Substance and Sexual Activity   Alcohol use: Not Currently    Comment: rare etoh    Drug use: No   Sexual activity: Not Currently  Other Topics Concern   Not on file  Social History Narrative   Lives in [place] with her daughter. She works full time in a [employer], doing nonexertional work. No herbal medications, regular diet. No regular exercise.    Social Determinants of Health   Financial Resource Strain: Not on file  Food Insecurity: Not on file  Transportation Needs: Not on file  Physical Activity: Not on file  Stress: Not on file  Social Connections: Not on file  Intimate Partner Violence: Not on file     Allergies  Allergen Reactions   Erythromycin      Outpatient Medications Prior to Visit  Medication Sig Dispense Refill   ALPRAZolam (XANAX) 0.5 MG tablet Take 0.5 mg by mouth daily  as needed for anxiety.     aspirin EC 81 MG tablet Take 81 mg by mouth daily.     Biotin 5000 MCG TABS Take 5,000 mcg by mouth daily.     budesonide-formoterol (SYMBICORT) 160-4.5 MCG/ACT inhaler INHALE 2 PUFFS INTO THE LUNGS TWICE A DAY 10.2 each 2   Calcium Carb-Cholecalciferol 600-5 MG-MCG TABS 1 tablet with food Orally Once a day     Cholecalciferol (VITAMIN D3 PO) Take 1 capsule by mouth daily.     clotrimazole (MYCELEX) 10 MG troche Suck and swallow one troche five times daily.     diclofenac (VOLTAREN) 50 MG EC tablet Take 50 mg by mouth 2 (two) times daily.     estradiol (ESTRACE) 0.1 MG/GM vaginal cream SMARTSIG:1 sparingly Vaginal Every Night     famotidine (PEPCID) 20 MG tablet Take 20 mg by  mouth daily.     folic acid (FOLVITE) 1 MG tablet Take 1 mg by mouth daily.     ipratropium-albuterol (DUONEB) 0.5-2.5 (3) MG/3ML SOLN NEBULIZE CONTENTS OF 1 VIAL EVERY 6 HOURS AS NEEDED 360 mL 1   Multiple Vitamin (QUINTABS) TABS Take 1 tablet by mouth at bedtime.     Multiple Vitamins-Minerals (MULTIVITAL) tablet Take 1 tablet by mouth daily.       MYRBETRIQ 50 MG TB24 tablet Take 50 mg by mouth daily.     nitroGLYCERIN (NITROSTAT) 0.4 MG SL tablet PLACE 1 TABLET (0.4 MG TOTAL) UNDER THE TONGUE EVERY 5 (FIVE) MINUTES AS NEEDED FOR CHEST PAIN. 25 tablet 3   pantoprazole (PROTONIX) 40 MG tablet Take 1 tablet by mouth at bedtime.     RINVOQ 15 MG TB24 Take by mouth.     traMADol (ULTRAM) 50 MG tablet Take 50 mg by mouth 2 (two) times daily as needed.     ENBREL SURECLICK 50 MG/ML injection Inject 50 mg into the skin once a week.     HUMIRA 40 MG/0.8ML PSKT Inject into the skin.     No facility-administered medications prior to visit.   Review of Systems  Constitutional:  Negative for chills, diaphoresis, fever, malaise/fatigue and weight loss.  HENT:  Negative for congestion, sinus pain and sore throat.   Eyes: Negative.   Respiratory:  Negative for cough, hemoptysis, sputum production,  shortness of breath and wheezing.   Cardiovascular:  Negative for chest pain, palpitations, orthopnea, claudication and leg swelling.  Gastrointestinal:  Negative for abdominal pain, heartburn, nausea and vomiting.  Genitourinary: Negative.   Musculoskeletal:  Negative for joint pain and myalgias.  Skin:  Negative for rash.  Neurological:  Negative for weakness.  Endo/Heme/Allergies: Negative.   Psychiatric/Behavioral: Negative.      Objective:   Vitals:   11/09/22 0848  BP: 112/74  Pulse: 75  Temp: 97.6 F (36.4 C)  TempSrc: Oral  SpO2: 97%  Weight: 190 lb 6.4 oz (86.4 kg)  Height: 5\' 4"  (1.626 m)   Physical Exam Constitutional:      General: She is not in acute distress.    Appearance: She is not ill-appearing.  HENT:     Head: Normocephalic and atraumatic.  Cardiovascular:     Rate and Rhythm: Normal rate and regular rhythm.     Pulses: Normal pulses.     Heart sounds: Normal heart sounds. No murmur heard. Pulmonary:     Effort: Pulmonary effort is normal.     Breath sounds: No wheezing, rhonchi or rales.  Musculoskeletal:     Right lower leg: No edema.     Left lower leg: No edema.  Skin:    General: Skin is warm and dry.  Neurological:     General: No focal deficit present.     Mental Status: She is alert.    CBC    Component Value Date/Time   WBC 9.4 08/07/2022 0000   WBC 6.1 11/02/2021 1148   RBC 4.60 08/07/2022 0000   HGB 12.5 08/07/2022 0000   HCT 39 08/07/2022 0000   PLT 344 08/07/2022 0000   MCV 83.5 11/02/2021 1148   MCHC 31.6 11/02/2021 1148   RDW 19.7 (H) 11/02/2021 1148   LYMPHSABS 0.9 11/02/2021 1148   MONOABS 0.6 11/02/2021 1148   EOSABS 0.4 11/02/2021 1148   BASOSABS 0.0 11/02/2021 1148      Latest Ref Rng & Units 03/02/2022   12:00 AM 11/02/2021   11:48 AM  05/16/2009   10:47 AM  BMP  Glucose 70 - 99 mg/dL  829  562   BUN 4 - 21 17     17  14    Creatinine 0.5 - 1.1 0.7     0.71  0.96   Sodium 137 - 147 137     138  145    Potassium 3.5 - 5.1 mEq/L 4.4     4.0  4.1   Chloride 99 - 108 103     102  110   CO2 13 - 22 25     25  28    Calcium 8.7 - 10.7 9.7     9.8  9.4      This result is from an external source.   Chest imaging: CXR 02/22/22 Small hiatal hernia.   Bronchitic changes with new atelectasis versus infiltrate LEFT lower lobe.   Vague nodular foci in the mid upper lungs bilaterally; CT chest recommended to exclude developing pulmonary nodules.  CXR 11/21/21 Small right pleural effusion without significant change from prior chest CT. This minimally tracks into the minor fissure, unchanged. Subsegmental linear opacities at the right lung base. No new or progressive airspace disease. The heart is normal in size. Retrocardiac hiatal hernia. No pulmonary edema. No pneumothorax. Mild thoracic scoliosis and spondylosis.  CT Chest w/contrast 11/02/21 Mediastinum/Nodes: No enlarged mediastinal, hilar, or axillary lymph nodes. There is a moderate hiatal hernia. Thyroid gland, trachea, and esophagus demonstrate no significant findings.   Lungs/Pleura: There is a small loculated right pleural effusion on the right with fluid both anteriorly and posteriorly. There is mild associated atelectasis with no significant pleural thickening or pulmonary mass. There is no pleural effusion on the left and the left lung is clear. There is no pneumothorax on either side. Bilateral bronchiectasis with a lower lung predominance is noted.  PFT:     No data to display          Labs:  Path:  Echo:  Heart Catheterization:  Assessment & Plan:   Bronchiectasis without complication (HCC) - Plan: CT CHEST HIGH RESOLUTION, Pulmonary Function Test, CANCELED: Pulmonary Function Test  Discussion: Janice [name] is a 77 year old woman, former smoker with rheumatoid arthritis on methotrexate and enbrel and bronchiectasis who returns to pulmonary clinic.  She has been doing well since last visit. She is to  continue symbicort 2 puffs twice daily and as needed albuterol.   Follow up in 1 year with repeat PFTs and HRCT Chest.  Melody Comas, MD [facility] Office: [phone]     Current Outpatient Medications:    ALPRAZolam (XANAX) 0.5 MG tablet, Take 0.5 mg by mouth daily as needed for anxiety., Disp: , Rfl:    aspirin EC 81 MG tablet, Take 81 mg by mouth daily., Disp: , Rfl:    Biotin 5000 MCG TABS, Take 5,000 mcg by mouth daily., Disp: , Rfl:    budesonide-formoterol (SYMBICORT) 160-4.5 MCG/ACT inhaler, INHALE 2 PUFFS INTO THE LUNGS TWICE A DAY, Disp: 10.2 each, Rfl: 2   Calcium Carb-Cholecalciferol 600-5 MG-MCG TABS, 1 tablet with food Orally Once a day, Disp: , Rfl:    Cholecalciferol (VITAMIN D3 PO), Take 1 capsule by mouth daily., Disp: , Rfl:    clotrimazole (MYCELEX) 10 MG troche, Suck and swallow one troche five times daily., Disp: , Rfl:    diclofenac (VOLTAREN) 50 MG EC tablet, Take 50 mg by mouth 2 (two) times daily., Disp: , Rfl:    estradiol (  ESTRACE) 0.1 MG/GM vaginal cream, SMARTSIG:1 sparingly Vaginal Every Night, Disp: , Rfl:    famotidine (PEPCID) 20 MG tablet, Take 20 mg by mouth daily., Disp: , Rfl:    folic acid (FOLVITE) 1 MG tablet, Take 1 mg by mouth daily., Disp: , Rfl:    ipratropium-albuterol (DUONEB) 0.5-2.5 (3) MG/3ML SOLN, NEBULIZE CONTENTS OF 1 VIAL EVERY 6 HOURS AS NEEDED, Disp: 360 mL, Rfl: 1   Multiple Vitamin (QUINTABS) TABS, Take 1 tablet by mouth at bedtime., Disp: , Rfl:    Multiple Vitamins-Minerals (MULTIVITAL) tablet, Take 1 tablet by mouth daily.  , Disp: , Rfl:    MYRBETRIQ 50 MG TB24 tablet, Take 50 mg by mouth daily., Disp: , Rfl:    nitroGLYCERIN (NITROSTAT) 0.4 MG SL tablet, PLACE 1 TABLET (0.4 MG TOTAL) UNDER THE TONGUE EVERY 5 (FIVE) MINUTES AS NEEDED FOR CHEST PAIN., Disp: 25 tablet, Rfl: 3   pantoprazole (PROTONIX) 40 MG tablet, Take 1 tablet by mouth at bedtime., Disp: , Rfl:    RINVOQ 15 MG TB24, Take by mouth.,  Disp: , Rfl:    traMADol (ULTRAM) 50 MG tablet, Take 50 mg by mouth 2 (two) times daily as needed., Disp: , Rfl:

## 2022-11-10 DIAGNOSIS — R300 Dysuria: Secondary | ICD-10-CM | POA: Diagnosis not present

## 2022-11-10 DIAGNOSIS — B9620 Unspecified Escherichia coli [E. coli] as the cause of diseases classified elsewhere: Secondary | ICD-10-CM | POA: Diagnosis not present

## 2022-11-10 DIAGNOSIS — N390 Urinary tract infection, site not specified: Secondary | ICD-10-CM | POA: Diagnosis not present

## 2022-11-10 DIAGNOSIS — N3281 Overactive bladder: Secondary | ICD-10-CM | POA: Diagnosis not present

## 2022-11-10 DIAGNOSIS — N3946 Mixed incontinence: Secondary | ICD-10-CM | POA: Diagnosis not present

## 2022-11-10 DIAGNOSIS — N813 Complete uterovaginal prolapse: Secondary | ICD-10-CM | POA: Diagnosis not present

## 2022-11-10 DIAGNOSIS — N393 Stress incontinence (female) (male): Secondary | ICD-10-CM | POA: Diagnosis not present

## 2022-11-10 DIAGNOSIS — N952 Postmenopausal atrophic vaginitis: Secondary | ICD-10-CM | POA: Diagnosis not present

## 2022-11-16 ENCOUNTER — Telehealth: Payer: Self-pay | Admitting: *Deleted

## 2022-11-16 NOTE — Progress Notes (Signed)
  Care Coordination   Note   11/16/2022 Name: Janice [name] MRN: [id number] DOB: [birth year]  Janice [name] is a 77 y.o. year old female who sees Goins, Gwenith Spitz, FNP for primary care. I reached out to Karolee Stamps by phone today to offer care coordination services.  Ms. Twardzik was given information about Care Coordination services today including:   The Care Coordination services include support from the care team which includes your Nurse Coordinator, Clinical Social Worker, or Pharmacist.  The Care Coordination team is here to help remove barriers to the health concerns and goals most important to you. Care Coordination services are voluntary, and the patient may decline or stop services at any time by request to their care team member.   Care Coordination Consent Status: Patient did not agree to participate in care coordination services at this time.    Encounter Outcome:  Pt. Refused  Patient PCP is with [facility]   [facility] - [facility] Coordination Care Guide  Direct Dial: [phone]

## 2023-01-19 ENCOUNTER — Other Ambulatory Visit: Payer: Self-pay

## 2023-01-19 DIAGNOSIS — J470 Bronchiectasis with acute lower respiratory infection: Secondary | ICD-10-CM

## 2023-01-19 MED ORDER — BUDESONIDE-FORMOTEROL FUMARATE 160-4.5 MCG/ACT IN AERO
2.0000 | INHALATION_SPRAY | Freq: Two times a day (BID) | RESPIRATORY_TRACT | 2 refills | Status: AC
Start: 2023-01-19 — End: ?

## 2023-02-02 LAB — CBC AND DIFFERENTIAL
HCT: 28 — AB (ref 36–46)
Hemoglobin: 9.4 — AB (ref 12.0–16.0)
Neutrophils Absolute: 5.76
Platelets: 446 10*3/uL — AB (ref 150–400)
WBC: 8

## 2023-02-02 LAB — CBC: RBC: 3.07 — AB (ref 3.87–5.11)

## 2023-02-04 NOTE — Progress Notes (Unsigned)
[facility]  [address] [place],  [place]  [address] [phone]  Clinic Day:  02/05/2023  Referring physician: Audie Pinto, FNP   HISTORY OF PRESENT ILLNESS:  The patient is a 77 y.o. female  with iron deficiency anemia.  Her iron and hemoglobin levels significantly improved after receiving IV iron in December 2023.  She comes in today to reassess her iron and hemoglobin levels.  The patient claims she has noticed also getting weaker over the past 2 weeks.  However, she denies having any overt forms of blood loss to explain this.    PHYSICAL EXAM:  Blood pressure (!) 141/78, pulse 77, temperature 98.4 F (36.9 C), resp. rate 16, height 5\' 4"  (1.626 m), weight 194 lb 6.4 oz (88.2 kg), SpO2 97%. Wt Readings from Last 3 Encounters:  02/05/23 194 lb 6.4 oz (88.2 kg)  11/09/22 190 lb 6.4 oz (86.4 kg)  08/07/22 194 lb 9.6 oz (88.3 kg)   Body mass index is 33.37 kg/m. Performance status (ECOG): 1 - Symptomatic but completely ambulatory Physical Exam Constitutional:      Appearance: Normal appearance. She is not ill-appearing.  HENT:     Mouth/Throat:     Mouth: Mucous membranes are moist.     Pharynx: Oropharynx is clear. No oropharyngeal exudate or posterior oropharyngeal erythema.  Cardiovascular:     Rate and Rhythm: Normal rate and regular rhythm.     Heart sounds: No murmur heard.    No friction rub. No gallop.  Pulmonary:     Effort: Pulmonary effort is normal. No respiratory distress.     Breath sounds: Normal breath sounds. No wheezing, rhonchi or rales.  Abdominal:     General: Bowel sounds are normal. There is no distension.     Palpations: Abdomen is soft. There is no mass.     Tenderness: There is no abdominal tenderness.  Musculoskeletal:        General: No swelling.     Right lower leg: No edema.     Left lower leg: No edema.  Lymphadenopathy:     Cervical: No cervical adenopathy.     Upper Body:     Right upper body:  No supraclavicular or axillary adenopathy.     Left upper body: No supraclavicular or axillary adenopathy.     Lower Body: No right inguinal adenopathy. No left inguinal adenopathy.  Skin:    General: Skin is warm.     Coloration: Skin is not jaundiced.     Findings: No lesion or rash.  Neurological:     General: No focal deficit present.     Mental Status: She is alert and oriented to person, place, and time. Mental status is at baseline.  Psychiatric:        Mood and Affect: Mood normal.        Behavior: Behavior normal.        Thought Content: Thought content normal.    LABS:     Latest Reference Range & Units 02/05/23 09:32  Iron 28 - 170 ug/dL 35  UIBC ug/dL 347  TIBC 425 - 956 ug/dL 387 (H)  Saturation Ratios 10.4 - 31.8 % 7 (L)  Ferritin 11 - 307 ng/mL 5 (L)  (H): Data is abnormally high (L): Data is abnormally low     Latest Ref Rng & Units 08/07/2022   12:00 AM 05/30/2022    1:49 PM 03/02/2022   12:00 AM  CBC  WBC  9.4  9.8     16.0      Hemoglobin 12.0 - 16.0 12.5     10.7     9.3      Hematocrit 36 - 46 39     34     31      Platelets 150 - 400 K/uL 344     387     732         This result is from an external source.      Latest Ref Rng & Units 03/02/2022   12:00 AM 11/02/2021   11:48 AM 05/16/2009   10:47 AM  CMP  Glucose 70 - 99 mg/dL  161  096   BUN 4 - 21 17     17  14    Creatinine 0.5 - 1.1 0.7     0.71  0.96   Sodium 137 - 147 137     138  145   Potassium 3.5 - 5.1 mEq/L 4.4     4.0  4.1   Chloride 99 - 108 103     102  110   CO2 13 - 22 25     25  28    Calcium 8.7 - 10.7 9.7     9.8  9.4   Total Protein 6.0 - 8.3 g/dL   6.8   Total Bilirubin 0.3 - 1.2 mg/dL   0.5   Alkaline Phos 25 - 125 34      40   AST 13 - 35 28      42   ALT 7 - 35 U/L 19      35      This result is from an external source.   ASSESSMENT & PLAN:  A 77 y.o. female with iron deficiency anemia.  Her labs today clearly show that she is iron deficient.  Based upon this, I  will arrange for her to receive another course IV iron in the forthcoming weeks.  Furthermore, I will arrange for her to undergo a GI workup to ensure her recurrent iron deficiency anemia is not due to any occult GI tract pathology.  I will see her back in 3 months for repeat clinical assessment.  The patient understands all the plans discussed today and is in agreement with them.  Charnel Giles Kirby Funk, MD

## 2023-02-05 ENCOUNTER — Inpatient Hospital Stay: Payer: Medicare HMO

## 2023-02-05 ENCOUNTER — Inpatient Hospital Stay: Payer: Medicare HMO | Attending: Oncology | Admitting: Oncology

## 2023-02-05 ENCOUNTER — Other Ambulatory Visit: Payer: Self-pay | Admitting: Oncology

## 2023-02-05 VITALS — BP 141/78 | HR 77 | Temp 98.4°F | Resp 16 | Ht 64.0 in | Wt 194.4 lb

## 2023-02-05 DIAGNOSIS — D508 Other iron deficiency anemias: Secondary | ICD-10-CM | POA: Diagnosis not present

## 2023-02-05 DIAGNOSIS — D509 Iron deficiency anemia, unspecified: Secondary | ICD-10-CM | POA: Diagnosis not present

## 2023-02-05 DIAGNOSIS — D649 Anemia, unspecified: Secondary | ICD-10-CM | POA: Diagnosis not present

## 2023-02-05 LAB — IRON AND TIBC
Iron: 35 ug/dL (ref 28–170)
Saturation Ratios: 7 % — ABNORMAL LOW (ref 10.4–31.8)
TIBC: 521 ug/dL — ABNORMAL HIGH (ref 250–450)
UIBC: 486 ug/dL

## 2023-02-05 LAB — VITAMIN B12: Vitamin B-12: 578 pg/mL (ref 180–914)

## 2023-02-05 LAB — FERRITIN: Ferritin: 5 ng/mL — ABNORMAL LOW (ref 11–307)

## 2023-02-05 LAB — FOLATE: Folate: >40 ng/mL (ref >5.9)

## 2023-02-06 ENCOUNTER — Other Ambulatory Visit: Payer: Self-pay

## 2023-02-06 DIAGNOSIS — Z7960 Long term (current) use of unspecified immunomodulators and immunosuppressants: Secondary | ICD-10-CM | POA: Diagnosis not present

## 2023-02-06 DIAGNOSIS — M810 Age-related osteoporosis without current pathological fracture: Secondary | ICD-10-CM | POA: Diagnosis not present

## 2023-02-06 DIAGNOSIS — M153 Secondary multiple arthritis: Secondary | ICD-10-CM | POA: Diagnosis not present

## 2023-02-06 DIAGNOSIS — D649 Anemia, unspecified: Secondary | ICD-10-CM | POA: Diagnosis not present

## 2023-02-06 DIAGNOSIS — M0579 Rheumatoid arthritis with rheumatoid factor of multiple sites without organ or systems involvement: Secondary | ICD-10-CM | POA: Diagnosis not present

## 2023-02-06 DIAGNOSIS — D508 Other iron deficiency anemias: Secondary | ICD-10-CM

## 2023-02-07 ENCOUNTER — Encounter: Payer: Self-pay | Admitting: Oncology

## 2023-02-07 MED FILL — Iron Sucrose Inj 20 MG/ML (Fe Equiv): INTRAVENOUS | Qty: 10 | Status: AC

## 2023-02-08 ENCOUNTER — Inpatient Hospital Stay: Payer: Medicare HMO

## 2023-02-08 VITALS — BP 144/83 | HR 76 | Temp 97.4°F | Resp 18

## 2023-02-08 DIAGNOSIS — D509 Iron deficiency anemia, unspecified: Secondary | ICD-10-CM | POA: Diagnosis not present

## 2023-02-08 DIAGNOSIS — D508 Other iron deficiency anemias: Secondary | ICD-10-CM

## 2023-02-08 MED ORDER — SODIUM CHLORIDE 0.9 % IV SOLN
200.0000 mg | Freq: Once | INTRAVENOUS | Status: AC
  Administered 2023-02-08: 200 mg via INTRAVENOUS
  Filled 2023-02-08: qty 200

## 2023-02-08 MED ORDER — SODIUM CHLORIDE 0.9 % IV SOLN: Freq: Once | INTRAVENOUS | Status: AC

## 2023-02-08 NOTE — Patient Instructions (Signed)
Iron Sucrose Injection What is this medication? IRON SUCROSE (EYE ern SOO krose) treats low levels of iron (iron deficiency anemia) in people with kidney disease. Iron is a mineral that plays an important role in making red blood cells, which carry oxygen from your lungs to the rest of your body. This medicine may be used for other purposes; ask your health care provider or pharmacist if you have questions. COMMON BRAND NAME(S): Venofer What should I tell my care team before I take this medication? They need to know if you have any of these conditions: Anemia not caused by low iron levels Heart disease High levels of iron in the blood Kidney disease Liver disease An unusual or allergic reaction to iron, other medications, foods, dyes, or preservatives Pregnant or trying to get pregnant Breastfeeding How should I use this medication? This medication is for infusion into a vein. It is given in a hospital or clinic setting. Talk to your care team about the use of this medication in children. While this medication may be prescribed for children as young as 2 years for selected conditions, precautions do apply. Overdosage: If you think you have taken too much of this medicine contact a poison control center or emergency room at once. NOTE: This medicine is only for you. Do not share this medicine with others. What if I miss a dose? Keep appointments for follow-up doses. It is important not to miss your dose. Call your care team if you are unable to keep an appointment. What may interact with this medication? Do not take this medication with any of the following: Deferoxamine Dimercaprol Other iron products This medication may also interact with the following: Chloramphenicol Deferasirox This list may not describe all possible interactions. Give your health care provider a list of all the medicines, herbs, non-prescription drugs, or dietary supplements you use. Also tell them if you smoke,  drink alcohol, or use illegal drugs. Some items may interact with your medicine. What should I watch for while using this medication? Visit your care team regularly. Tell your care team if your symptoms do not start to get better or if they get worse. You may need blood work done while you are taking this medication. You may need to follow a special diet. Talk to your care team. Foods that contain iron include: whole grains/cereals, dried fruits, beans, or peas, leafy green vegetables, and organ meats (liver, kidney). What side effects may I notice from receiving this medication? Side effects that you should report to your care team as soon as possible: Allergic reactions--skin rash, itching, hives, swelling of the face, lips, tongue, or throat Low blood pressure--dizziness, feeling faint or lightheaded, blurry vision Shortness of breath Side effects that usually do not require medical attention (report to your care team if they continue or are bothersome): Flushing Headache Joint pain Muscle pain Nausea Pain, redness, or irritation at injection site This list may not describe all possible side effects. Call your doctor for medical advice about side effects. You may report side effects to FDA at 1-800-FDA-1088. Where should I keep my medication? This medication is given in a hospital or clinic and will not be stored at home. NOTE: This sheet is a summary. It may not cover all possible information. If you have questions about this medicine, talk to your doctor, pharmacist, or health care provider.  2023 Elsevier/Gold Standard (2020-09-16 00:00:00)  

## 2023-02-09 MED FILL — Iron Sucrose Inj 20 MG/ML (Fe Equiv): INTRAVENOUS | Qty: 10 | Status: AC

## 2023-02-12 ENCOUNTER — Inpatient Hospital Stay: Payer: Medicare HMO

## 2023-02-12 VITALS — BP 146/67 | HR 96 | Temp 97.8°F | Resp 20 | Ht 64.0 in | Wt 199.1 lb

## 2023-02-12 DIAGNOSIS — D509 Iron deficiency anemia, unspecified: Secondary | ICD-10-CM | POA: Diagnosis not present

## 2023-02-12 DIAGNOSIS — D508 Other iron deficiency anemias: Secondary | ICD-10-CM

## 2023-02-12 MED ORDER — SODIUM CHLORIDE 0.9 % IV SOLN
200.0000 mg | Freq: Once | INTRAVENOUS | Status: AC
  Administered 2023-02-12: 200 mg via INTRAVENOUS
  Filled 2023-02-12: qty 200

## 2023-02-12 MED ORDER — SODIUM CHLORIDE 0.9 % IV SOLN: Freq: Once | INTRAVENOUS | Status: AC

## 2023-02-12 NOTE — Patient Instructions (Signed)
 Iron Sucrose Injection What is this medication? IRON SUCROSE (EYE ern SOO krose) treats low levels of iron (iron deficiency anemia) in people with kidney disease. Iron is a mineral that plays an important role in making red blood cells, which carry oxygen from your lungs to the rest of your body. This medicine may be used for other purposes; ask your health care provider or pharmacist if you have questions. COMMON BRAND NAME(S): Venofer What should I tell my care team before I take this medication? They need to know if you have any of these conditions: Anemia not caused by low iron levels Heart disease High levels of iron in the blood Kidney disease Liver disease An unusual or allergic reaction to iron, other medications, foods, dyes, or preservatives Pregnant or trying to get pregnant Breastfeeding How should I use this medication? This medication is for infusion into a vein. It is given in a hospital or clinic setting. Talk to your care team about the use of this medication in children. While this medication may be prescribed for children as young as 2 years for selected conditions, precautions do apply. Overdosage: If you think you have taken too much of this medicine contact a poison control center or emergency room at once. NOTE: This medicine is only for you. Do not share this medicine with others. What if I miss a dose? Keep appointments for follow-up doses. It is important not to miss your dose. Call your care team if you are unable to keep an appointment. What may interact with this medication? Do not take this medication with any of the following: Deferoxamine Dimercaprol Other iron products This medication may also interact with the following: Chloramphenicol Deferasirox This list may not describe all possible interactions. Give your health care provider a list of all the medicines, herbs, non-prescription drugs, or dietary supplements you use. Also tell them if you smoke,  drink alcohol, or use illegal drugs. Some items may interact with your medicine. What should I watch for while using this medication? Visit your care team regularly. Tell your care team if your symptoms do not start to get better or if they get worse. You may need blood work done while you are taking this medication. You may need to follow a special diet. Talk to your care team. Foods that contain iron include: whole grains/cereals, dried fruits, beans, or peas, leafy green vegetables, and organ meats (liver, kidney). What side effects may I notice from receiving this medication? Side effects that you should report to your care team as soon as possible: Allergic reactions--skin rash, itching, hives, swelling of the face, lips, tongue, or throat Low blood pressure--dizziness, feeling faint or lightheaded, blurry vision Shortness of breath Side effects that usually do not require medical attention (report to your care team if they continue or are bothersome): Flushing Headache Joint pain Muscle pain Nausea Pain, redness, or irritation at injection site This list may not describe all possible side effects. Call your doctor for medical advice about side effects. You may report side effects to FDA at 1-800-FDA-1088. Where should I keep my medication? This medication is given in a hospital or clinic. It will not be stored at home. NOTE: This sheet is a summary. It may not cover all possible information. If you have questions about this medicine, talk to your doctor, pharmacist, or health care provider.  2024 Elsevier/Gold Standard (2022-11-10 00:00:00)

## 2023-02-13 MED FILL — Iron Sucrose Inj 20 MG/ML (Fe Equiv): INTRAVENOUS | Qty: 10 | Status: AC

## 2023-02-14 ENCOUNTER — Inpatient Hospital Stay: Payer: Medicare HMO

## 2023-02-14 VITALS — BP 167/73 | HR 74 | Temp 98.0°F | Resp 18

## 2023-02-14 DIAGNOSIS — D508 Other iron deficiency anemias: Secondary | ICD-10-CM

## 2023-02-14 DIAGNOSIS — D509 Iron deficiency anemia, unspecified: Secondary | ICD-10-CM | POA: Diagnosis not present

## 2023-02-14 MED ORDER — SODIUM CHLORIDE 0.9 % IV SOLN
200.0000 mg | Freq: Once | INTRAVENOUS | Status: AC
  Administered 2023-02-14: 200 mg via INTRAVENOUS
  Filled 2023-02-14: qty 200

## 2023-02-14 MED ORDER — SODIUM CHLORIDE 0.9 % IV SOLN: Freq: Once | INTRAVENOUS | Status: AC

## 2023-02-14 NOTE — Patient Instructions (Signed)
 Iron Sucrose Injection What is this medication? IRON SUCROSE (EYE ern SOO krose) treats low levels of iron (iron deficiency anemia) in people with kidney disease. Iron is a mineral that plays an important role in making red blood cells, which carry oxygen from your lungs to the rest of your body. This medicine may be used for other purposes; ask your health care provider or pharmacist if you have questions. COMMON BRAND NAME(S): Venofer What should I tell my care team before I take this medication? They need to know if you have any of these conditions: Anemia not caused by low iron levels Heart disease High levels of iron in the blood Kidney disease Liver disease An unusual or allergic reaction to iron, other medications, foods, dyes, or preservatives Pregnant or trying to get pregnant Breastfeeding How should I use this medication? This medication is for infusion into a vein. It is given in a hospital or clinic setting. Talk to your care team about the use of this medication in children. While this medication may be prescribed for children as young as 2 years for selected conditions, precautions do apply. Overdosage: If you think you have taken too much of this medicine contact a poison control center or emergency room at once. NOTE: This medicine is only for you. Do not share this medicine with others. What if I miss a dose? Keep appointments for follow-up doses. It is important not to miss your dose. Call your care team if you are unable to keep an appointment. What may interact with this medication? Do not take this medication with any of the following: Deferoxamine Dimercaprol Other iron products This medication may also interact with the following: Chloramphenicol Deferasirox This list may not describe all possible interactions. Give your health care provider a list of all the medicines, herbs, non-prescription drugs, or dietary supplements you use. Also tell them if you smoke,  drink alcohol, or use illegal drugs. Some items may interact with your medicine. What should I watch for while using this medication? Visit your care team regularly. Tell your care team if your symptoms do not start to get better or if they get worse. You may need blood work done while you are taking this medication. You may need to follow a special diet. Talk to your care team. Foods that contain iron include: whole grains/cereals, dried fruits, beans, or peas, leafy green vegetables, and organ meats (liver, kidney). What side effects may I notice from receiving this medication? Side effects that you should report to your care team as soon as possible: Allergic reactions--skin rash, itching, hives, swelling of the face, lips, tongue, or throat Low blood pressure--dizziness, feeling faint or lightheaded, blurry vision Shortness of breath Side effects that usually do not require medical attention (report to your care team if they continue or are bothersome): Flushing Headache Joint pain Muscle pain Nausea Pain, redness, or irritation at injection site This list may not describe all possible side effects. Call your doctor for medical advice about side effects. You may report side effects to FDA at 1-800-FDA-1088. Where should I keep my medication? This medication is given in a hospital or clinic. It will not be stored at home. NOTE: This sheet is a summary. It may not cover all possible information. If you have questions about this medicine, talk to your doctor, pharmacist, or health care provider.  2024 Elsevier/Gold Standard (2022-11-10 00:00:00)

## 2023-02-15 MED FILL — Iron Sucrose Inj 20 MG/ML (Fe Equiv): INTRAVENOUS | Qty: 10 | Status: AC

## 2023-02-16 ENCOUNTER — Inpatient Hospital Stay: Payer: Medicare HMO

## 2023-02-16 VITALS — BP 128/72 | HR 66 | Temp 98.5°F | Resp 18

## 2023-02-16 DIAGNOSIS — D509 Iron deficiency anemia, unspecified: Secondary | ICD-10-CM | POA: Diagnosis not present

## 2023-02-16 DIAGNOSIS — D508 Other iron deficiency anemias: Secondary | ICD-10-CM

## 2023-02-16 MED ORDER — SODIUM CHLORIDE 0.9 % IV SOLN
200.0000 mg | Freq: Once | INTRAVENOUS | Status: AC
  Administered 2023-02-16: 200 mg via INTRAVENOUS
  Filled 2023-02-16: qty 200

## 2023-02-16 MED ORDER — SODIUM CHLORIDE 0.9 % IV SOLN: Freq: Once | INTRAVENOUS | Status: AC

## 2023-02-16 NOTE — Patient Instructions (Signed)
Iron Sucrose Injection What is this medication? IRON SUCROSE (EYE ern SOO krose) treats low levels of iron (iron deficiency anemia) in people with kidney disease. Iron is a mineral that plays an important role in making red blood cells, which carry oxygen from your lungs to the rest of your body. This medicine may be used for other purposes; ask your health care provider or pharmacist if you have questions. COMMON BRAND NAME(S): Venofer What should I tell my care team before I take this medication? They need to know if you have any of these conditions: Anemia not caused by low iron levels Heart disease High levels of iron in the blood Kidney disease Liver disease An unusual or allergic reaction to iron, other medications, foods, dyes, or preservatives Pregnant or trying to get pregnant Breastfeeding How should I use this medication? This medication is for infusion into a vein. It is given in a hospital or clinic setting. Talk to your care team about the use of this medication in children. While this medication may be prescribed for children as young as 2 years for selected conditions, precautions do apply. Overdosage: If you think you have taken too much of this medicine contact a poison control center or emergency room at once. NOTE: This medicine is only for you. Do not share this medicine with others. What if I miss a dose? Keep appointments for follow-up doses. It is important not to miss your dose. Call your care team if you are unable to keep an appointment. What may interact with this medication? Do not take this medication with any of the following: Deferoxamine Dimercaprol Other iron products This medication may also interact with the following: Chloramphenicol Deferasirox This list may not describe all possible interactions. Give your health care provider a list of all the medicines, herbs, non-prescription drugs, or dietary supplements you use. Also tell them if you smoke,  drink alcohol, or use illegal drugs. Some items may interact with your medicine. What should I watch for while using this medication? Visit your care team regularly. Tell your care team if your symptoms do not start to get better or if they get worse. You may need blood work done while you are taking this medication. You may need to follow a special diet. Talk to your care team. Foods that contain iron include: whole grains/cereals, dried fruits, beans, or peas, leafy green vegetables, and organ meats (liver, kidney). What side effects may I notice from receiving this medication? Side effects that you should report to your care team as soon as possible: Allergic reactions--skin rash, itching, hives, swelling of the face, lips, tongue, or throat Low blood pressure--dizziness, feeling faint or lightheaded, blurry vision Shortness of breath Side effects that usually do not require medical attention (report to your care team if they continue or are bothersome): Flushing Headache Joint pain Muscle pain Nausea Pain, redness, or irritation at injection site This list may not describe all possible side effects. Call your doctor for medical advice about side effects. You may report side effects to FDA at 1-800-FDA-1088. Where should I keep my medication? This medication is given in a hospital or clinic and will not be stored at home. NOTE: This sheet is a summary. It may not cover all possible information. If you have questions about this medicine, talk to your doctor, pharmacist, or health care provider.  2023 Elsevier/Gold Standard (2020-09-16 00:00:00)  

## 2023-02-20 ENCOUNTER — Encounter: Payer: Self-pay | Admitting: Oncology

## 2023-02-21 ENCOUNTER — Inpatient Hospital Stay: Payer: Medicare HMO | Attending: Oncology

## 2023-02-21 VITALS — BP 143/74 | HR 70 | Temp 98.0°F | Resp 18

## 2023-02-21 DIAGNOSIS — D509 Iron deficiency anemia, unspecified: Secondary | ICD-10-CM | POA: Diagnosis not present

## 2023-02-21 DIAGNOSIS — D508 Other iron deficiency anemias: Secondary | ICD-10-CM

## 2023-02-21 MED ORDER — SODIUM CHLORIDE 0.9 % IV SOLN: Freq: Once | INTRAVENOUS | Status: AC

## 2023-02-21 MED ORDER — SODIUM CHLORIDE 0.9 % IV SOLN
200.0000 mg | Freq: Once | INTRAVENOUS | Status: AC
  Administered 2023-02-21: 200 mg via INTRAVENOUS
  Filled 2023-02-21: qty 200

## 2023-02-21 NOTE — Patient Instructions (Signed)
 Iron Sucrose Injection What is this medication? IRON SUCROSE (EYE ern SOO krose) treats low levels of iron (iron deficiency anemia) in people with kidney disease. Iron is a mineral that plays an important role in making red blood cells, which carry oxygen from your lungs to the rest of your body. This medicine may be used for other purposes; ask your health care provider or pharmacist if you have questions. COMMON BRAND NAME(S): Venofer What should I tell my care team before I take this medication? They need to know if you have any of these conditions: Anemia not caused by low iron levels Heart disease High levels of iron in the blood Kidney disease Liver disease An unusual or allergic reaction to iron, other medications, foods, dyes, or preservatives Pregnant or trying to get pregnant Breastfeeding How should I use this medication? This medication is for infusion into a vein. It is given in a hospital or clinic setting. Talk to your care team about the use of this medication in children. While this medication may be prescribed for children as young as 2 years for selected conditions, precautions do apply. Overdosage: If you think you have taken too much of this medicine contact a poison control center or emergency room at once. NOTE: This medicine is only for you. Do not share this medicine with others. What if I miss a dose? Keep appointments for follow-up doses. It is important not to miss your dose. Call your care team if you are unable to keep an appointment. What may interact with this medication? Do not take this medication with any of the following: Deferoxamine Dimercaprol Other iron products This medication may also interact with the following: Chloramphenicol Deferasirox This list may not describe all possible interactions. Give your health care provider a list of all the medicines, herbs, non-prescription drugs, or dietary supplements you use. Also tell them if you smoke,  drink alcohol, or use illegal drugs. Some items may interact with your medicine. What should I watch for while using this medication? Visit your care team regularly. Tell your care team if your symptoms do not start to get better or if they get worse. You may need blood work done while you are taking this medication. You may need to follow a special diet. Talk to your care team. Foods that contain iron include: whole grains/cereals, dried fruits, beans, or peas, leafy green vegetables, and organ meats (liver, kidney). What side effects may I notice from receiving this medication? Side effects that you should report to your care team as soon as possible: Allergic reactions--skin rash, itching, hives, swelling of the face, lips, tongue, or throat Low blood pressure--dizziness, feeling faint or lightheaded, blurry vision Shortness of breath Side effects that usually do not require medical attention (report to your care team if they continue or are bothersome): Flushing Headache Joint pain Muscle pain Nausea Pain, redness, or irritation at injection site This list may not describe all possible side effects. Call your doctor for medical advice about side effects. You may report side effects to FDA at 1-800-FDA-1088. Where should I keep my medication? This medication is given in a hospital or clinic. It will not be stored at home. NOTE: This sheet is a summary. It may not cover all possible information. If you have questions about this medicine, talk to your doctor, pharmacist, or health care provider.  2024 Elsevier/Gold Standard (2022-11-10 00:00:00)

## 2023-04-06 DIAGNOSIS — N390 Urinary tract infection, site not specified: Secondary | ICD-10-CM | POA: Diagnosis not present

## 2023-04-06 DIAGNOSIS — N3941 Urge incontinence: Secondary | ICD-10-CM | POA: Diagnosis not present

## 2023-04-06 DIAGNOSIS — B9620 Unspecified Escherichia coli [E. coli] as the cause of diseases classified elsewhere: Secondary | ICD-10-CM | POA: Diagnosis not present

## 2023-04-06 DIAGNOSIS — N3281 Overactive bladder: Secondary | ICD-10-CM | POA: Diagnosis not present

## 2023-04-06 DIAGNOSIS — N952 Postmenopausal atrophic vaginitis: Secondary | ICD-10-CM | POA: Diagnosis not present

## 2023-04-09 IMAGING — DX DG CHEST 2V
2 series · 2 of 2 positions shown · non-contrast
Comparison: Chest radiograph 11/24/2019, chest CT 11/02/2021

CLINICAL DATA: Loculated pleural effusion. Shortness of breath.
Pleural effusion, new cough and dyspnea.

EXAM:
CHEST - 2 VIEW

[chest pa]
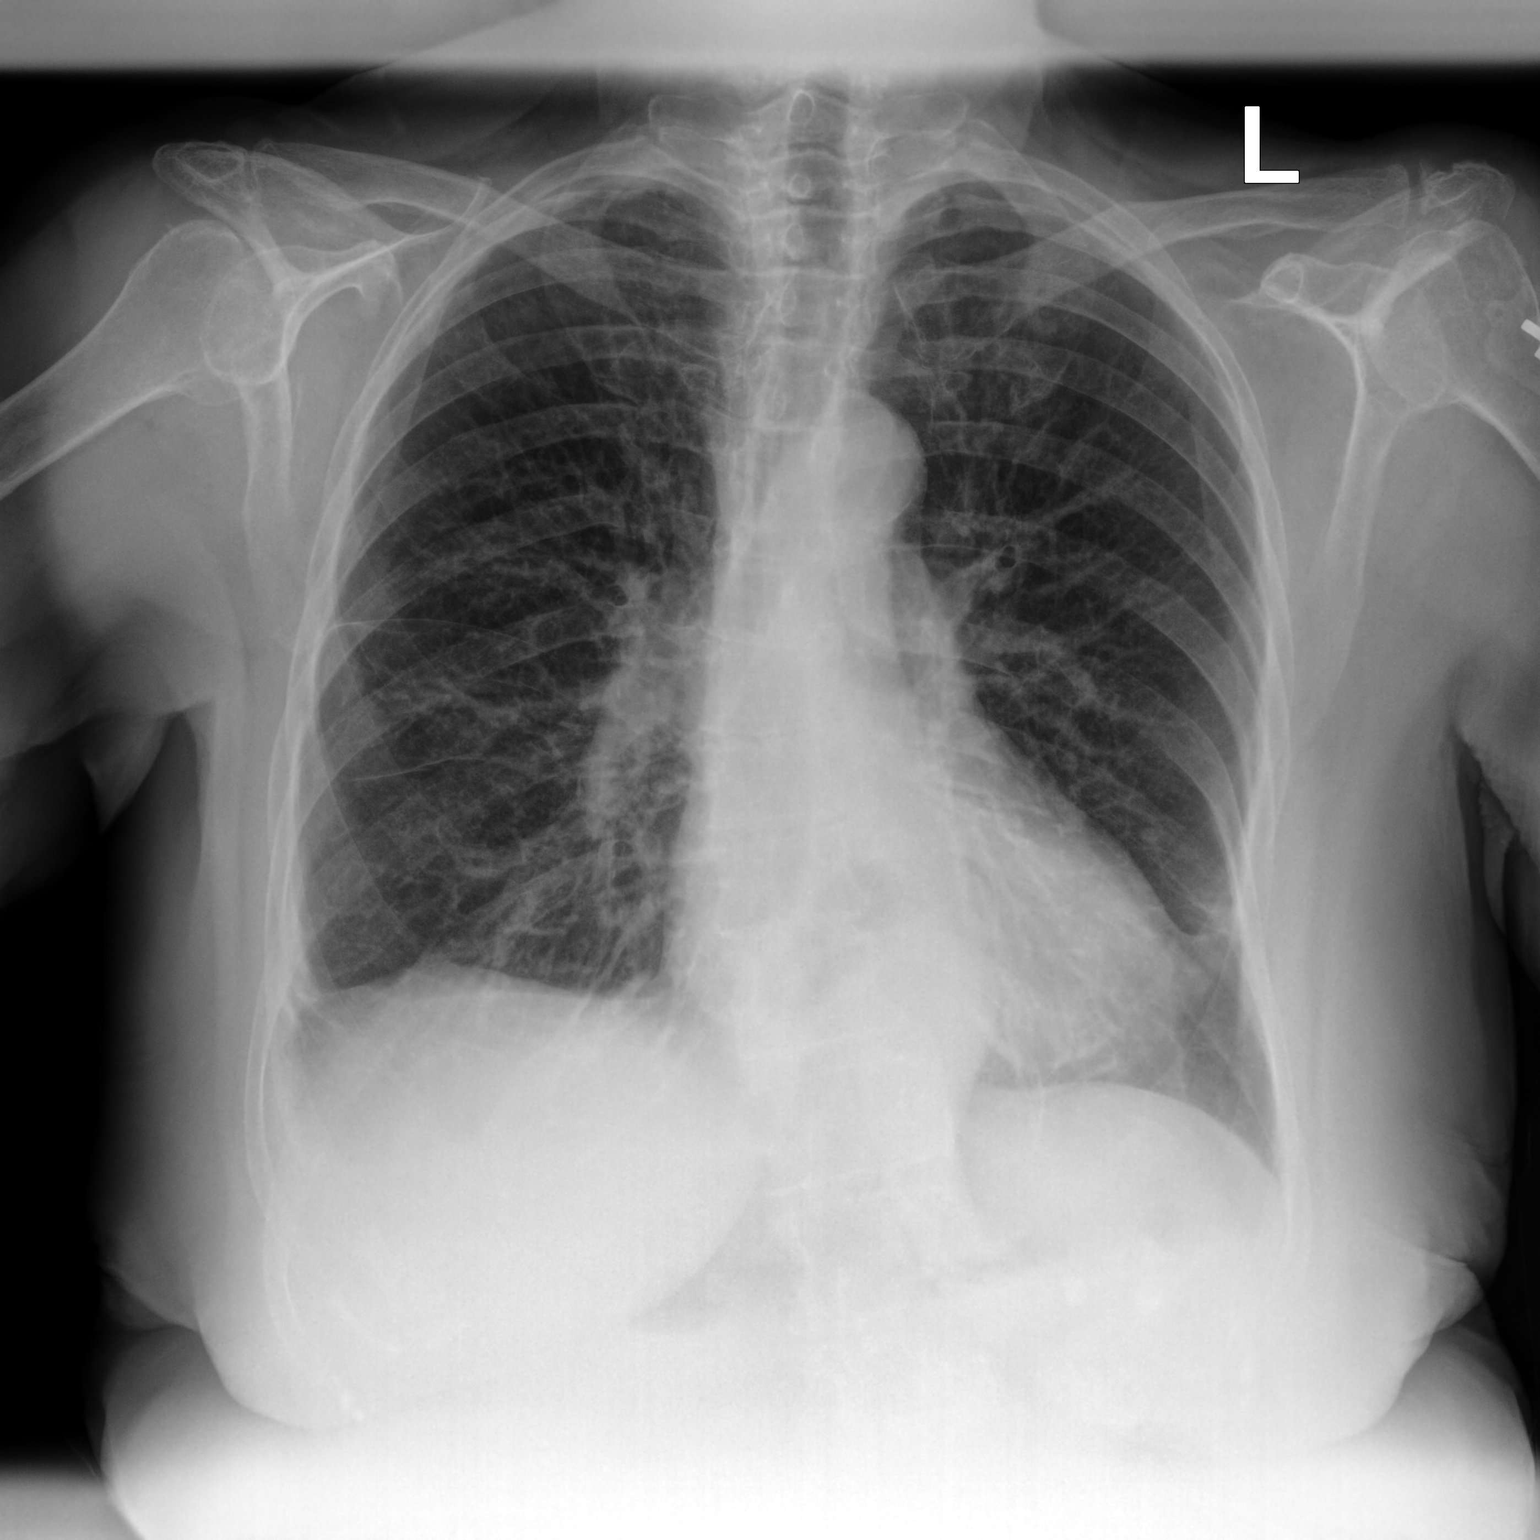

[chest lat]
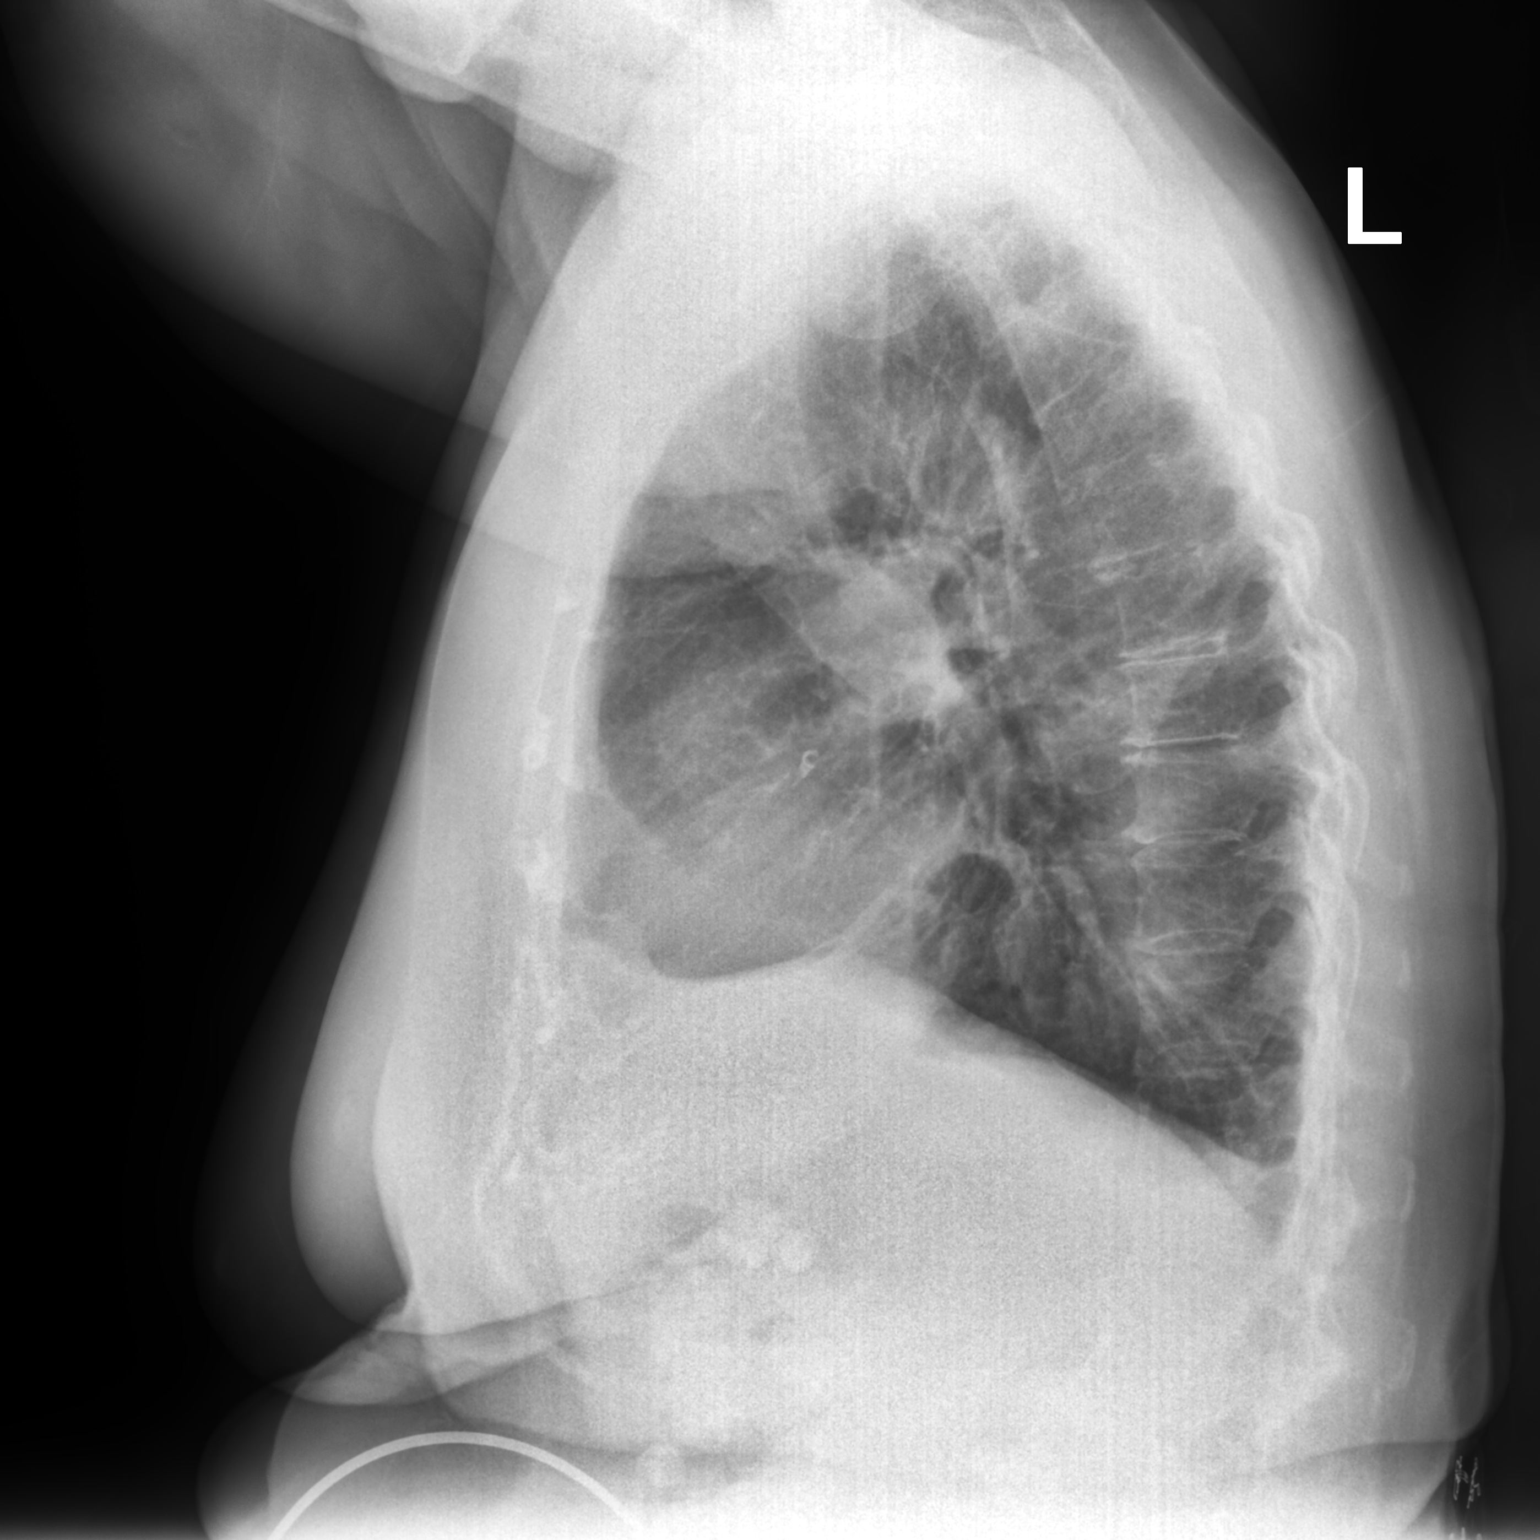

[2 of 2 positions shown; findings below may reference images not displayed]

FINDINGS: Small right pleural effusion without significant change from prior
chest CT. This minimally tracks into the minor fissure, unchanged.
Subsegmental linear opacities at the right lung base. No new or
progressive airspace disease. The heart is normal in size.
Retrocardiac hiatal hernia. No pulmonary edema. No pneumothorax.
Mild thoracic scoliosis and spondylosis.
IMPRESSION: 1. Unchanged small right pleural effusion and right basilar
atelectasis since prior chest CT.
2. No new radiographic abnormalities.
3. Retrocardiac hiatal hernia.

## 2023-05-07 NOTE — Progress Notes (Deleted)
[facility]  [address] [place],  [place]  [address] [phone]  Clinic Day:  05/07/2023  Referring physician: Audie Pinto, FNP   HISTORY OF PRESENT ILLNESS:  The patient is a 77 y.o. female  with iron deficiency anemia.  Her iron and hemoglobin levels significantly improved after receiving IV iron in December 2023.  She comes in today to reassess her iron and hemoglobin levels.  The patient claims she has noticed also getting weaker over the past 2 weeks.  However, she denies having any overt forms of blood loss to explain this.    PHYSICAL EXAM:  There were no vitals taken for this visit. Wt Readings from Last 3 Encounters:  02/12/23 199 lb 1.3 oz (90.3 kg)  02/05/23 194 lb 6.4 oz (88.2 kg)  11/09/22 190 lb 6.4 oz (86.4 kg)   There is no height or weight on file to calculate BMI. Performance status (ECOG): 1 - Symptomatic but completely ambulatory Physical Exam Constitutional:      Appearance: Normal appearance. She is not ill-appearing.  HENT:     Mouth/Throat:     Mouth: Mucous membranes are moist.     Pharynx: Oropharynx is clear. No oropharyngeal exudate or posterior oropharyngeal erythema.  Cardiovascular:     Rate and Rhythm: Normal rate and regular rhythm.     Heart sounds: No murmur heard.    No friction rub. No gallop.  Pulmonary:     Effort: Pulmonary effort is normal. No respiratory distress.     Breath sounds: Normal breath sounds. No wheezing, rhonchi or rales.  Abdominal:     General: Bowel sounds are normal. There is no distension.     Palpations: Abdomen is soft. There is no mass.     Tenderness: There is no abdominal tenderness.  Musculoskeletal:        General: No swelling.     Right lower leg: No edema.     Left lower leg: No edema.  Lymphadenopathy:     Cervical: No cervical adenopathy.     Upper Body:     Right upper body: No supraclavicular or axillary adenopathy.     Left upper body: No supraclavicular  or axillary adenopathy.     Lower Body: No right inguinal adenopathy. No left inguinal adenopathy.  Skin:    General: Skin is warm.     Coloration: Skin is not jaundiced.     Findings: No lesion or rash.  Neurological:     General: No focal deficit present.     Mental Status: She is alert and oriented to person, place, and time. Mental status is at baseline.  Psychiatric:        Mood and Affect: Mood normal.        Behavior: Behavior normal.        Thought Content: Thought content normal.    LABS:     Latest Reference Range & Units 02/05/23 09:32  Iron 28 - 170 ug/dL 35  UIBC ug/dL 132  TIBC 440 - 102 ug/dL 725 (H)  Saturation Ratios 10.4 - 31.8 % 7 (L)  Ferritin 11 - 307 ng/mL 5 (L)  (H): Data is abnormally high (L): Data is abnormally low     Latest Ref Rng & Units 02/02/2023   12:00 AM 08/07/2022   12:00 AM 05/30/2022    1:49 PM  CBC  WBC  8.0     9.4     9.8  Hemoglobin 12.0 - 16.0 9.4     12.5     10.7      Hematocrit 36 - 46 28     39     34      Platelets 150 - 400 K/uL 446     344     387         This result is from an external source.      Latest Ref Rng & Units 03/02/2022   12:00 AM 11/02/2021   11:48 AM 05/16/2009   10:47 AM  CMP  Glucose 70 - 99 mg/dL  784  696   BUN 4 - 21 17     17  14    Creatinine 0.5 - 1.1 0.7     0.71  0.96   Sodium 137 - 147 137     138  145   Potassium 3.5 - 5.1 mEq/L 4.4     4.0  4.1   Chloride 99 - 108 103     102  110   CO2 13 - 22 25     25  28    Calcium 8.7 - 10.7 9.7     9.8  9.4   Total Protein 6.0 - 8.3 g/dL   6.8   Total Bilirubin 0.3 - 1.2 mg/dL   0.5   Alkaline Phos 25 - 125 34      40   AST 13 - 35 28      42   ALT 7 - 35 U/L 19      35      This result is from an external source.   ASSESSMENT & PLAN:  A 76 y.o. female with iron deficiency anemia.  Her labs today clearly show that she is iron deficient.  Based upon this, I will arrange for her to receive another course IV iron in the forthcoming weeks.   Furthermore, I will arrange for her to undergo a GI workup to ensure her recurrent iron deficiency anemia is not due to any occult GI tract pathology.  I will see her back in 3 months for repeat clinical assessment.  The patient understands all the plans discussed today and is in agreement with them.  Azaliah Carrero Kirby Funk, MD

## 2023-05-08 ENCOUNTER — Inpatient Hospital Stay: Payer: Medicare HMO | Admitting: Oncology

## 2023-05-08 ENCOUNTER — Inpatient Hospital Stay: Payer: Medicare HMO

## 2023-05-13 NOTE — Progress Notes (Deleted)
[facility]  [address] [place],  [place]  [address] [phone]  Clinic Day:  05/14/2023  Referring physician: Audie Pinto, FNP   HISTORY OF PRESENT ILLNESS:  The patient is a 77 y.o. female  with iron deficiency anemia.  Her iron and hemoglobin levels significantly improved after receiving IV iron in December 2023.  She comes in today to reassess her iron and hemoglobin levels.  The patient claims she has noticed also getting weaker over the past 2 weeks.  However, she denies having any overt forms of blood loss to explain this.    The patient is scheduled to see GI in January 2025 to be evaluated for both an EGD and colonoscopy.  PHYSICAL EXAM:  There were no vitals taken for this visit. Wt Readings from Last 3 Encounters:  02/12/23 199 lb 1.3 oz (90.3 kg)  02/05/23 194 lb 6.4 oz (88.2 kg)  11/09/22 190 lb 6.4 oz (86.4 kg)   There is no height or weight on file to calculate BMI. Performance status (ECOG): 1 - Symptomatic but completely ambulatory Physical Exam Constitutional:      Appearance: Normal appearance. She is not ill-appearing.  HENT:     Mouth/Throat:     Mouth: Mucous membranes are moist.     Pharynx: Oropharynx is clear. No oropharyngeal exudate or posterior oropharyngeal erythema.  Cardiovascular:     Rate and Rhythm: Normal rate and regular rhythm.     Heart sounds: No murmur heard.    No friction rub. No gallop.  Pulmonary:     Effort: Pulmonary effort is normal. No respiratory distress.     Breath sounds: Normal breath sounds. No wheezing, rhonchi or rales.  Abdominal:     General: Bowel sounds are normal. There is no distension.     Palpations: Abdomen is soft. There is no mass.     Tenderness: There is no abdominal tenderness.  Musculoskeletal:        General: No swelling.     Right lower leg: No edema.     Left lower leg: No edema.  Lymphadenopathy:     Cervical: No cervical adenopathy.     Upper Body:      Right upper body: No supraclavicular or axillary adenopathy.     Left upper body: No supraclavicular or axillary adenopathy.     Lower Body: No right inguinal adenopathy. No left inguinal adenopathy.  Skin:    General: Skin is warm.     Coloration: Skin is not jaundiced.     Findings: No lesion or rash.  Neurological:     General: No focal deficit present.     Mental Status: She is alert and oriented to person, place, and time. Mental status is at baseline.  Psychiatric:        Mood and Affect: Mood normal.        Behavior: Behavior normal.        Thought Content: Thought content normal.    LABS:     Latest Reference Range & Units 02/05/23 09:32  Iron 28 - 170 ug/dL 35  UIBC ug/dL 865  TIBC 784 - 696 ug/dL 295 (H)  Saturation Ratios 10.4 - 31.8 % 7 (L)  Ferritin 11 - 307 ng/mL 5 (L)  (H): Data is abnormally high (L): Data is abnormally low     Latest Ref Rng & Units 05/14/2023   11:04 AM 02/02/2023   12:00 AM 08/07/2022   12:00 AM  CBC  WBC 4.0 - 10.5 K/uL 5.5  8.0     9.4      Hemoglobin 12.0 - 15.0 g/dL 40.3  9.4     47.4      Hematocrit 36.0 - 46.0 % 35.1  28     39      Platelets 150 - 400 K/uL 376  446     344         This result is from an external source.      Latest Ref Rng & Units 03/02/2022   12:00 AM 11/02/2021   11:48 AM 05/16/2009   10:47 AM  CMP  Glucose 70 - 99 mg/dL  259  563   BUN 4 - 21 17     17  14    Creatinine 0.5 - 1.1 0.7     0.71  0.96   Sodium 137 - 147 137     138  145   Potassium 3.5 - 5.1 mEq/L 4.4     4.0  4.1   Chloride 99 - 108 103     102  110   CO2 13 - 22 25     25  28    Calcium 8.7 - 10.7 9.7     9.8  9.4   Total Protein 6.0 - 8.3 g/dL   6.8   Total Bilirubin 0.3 - 1.2 mg/dL   0.5   Alkaline Phos 25 - 125 34      40   AST 13 - 35 28      42   ALT 7 - 35 U/L 19      35      This result is from an external source.   ASSESSMENT & PLAN:  A 77 y.o. female with iron deficiency anemia.  Her labs today clearly show that she is  iron deficient.  Based upon this, I will arrange for her to receive another course IV iron in the forthcoming weeks.  Furthermore, I will arrange for her to undergo a GI workup to ensure her recurrent iron deficiency anemia is not due to any occult GI tract pathology.  I will see her back in 3 months for repeat clinical assessment.  The patient understands all the plans discussed today and is in agreement with them.  Josilyn Shippee Kirby Funk, MD

## 2023-05-14 ENCOUNTER — Inpatient Hospital Stay: Payer: Medicare HMO

## 2023-05-14 ENCOUNTER — Inpatient Hospital Stay: Payer: Medicare HMO | Attending: Oncology | Admitting: Oncology

## 2023-05-14 ENCOUNTER — Telehealth: Payer: Self-pay | Admitting: Oncology

## 2023-05-14 VITALS — BP 153/78 | HR 75 | Temp 98.0°F | Resp 14 | Ht 64.0 in | Wt 192.5 lb

## 2023-05-14 DIAGNOSIS — D509 Iron deficiency anemia, unspecified: Secondary | ICD-10-CM | POA: Diagnosis not present

## 2023-05-14 DIAGNOSIS — E611 Iron deficiency: Secondary | ICD-10-CM

## 2023-05-14 DIAGNOSIS — D508 Other iron deficiency anemias: Secondary | ICD-10-CM

## 2023-05-14 LAB — CBC WITH DIFFERENTIAL (CANCER CENTER ONLY)
Abs Immature Granulocytes: 0.02 10*3/uL (ref 0.00–0.07)
Basophils Absolute: 0 10*3/uL (ref 0.0–0.1)
Basophils Relative: 1 %
Eosinophils Absolute: 0.3 10*3/uL (ref 0.0–0.5)
Eosinophils Relative: 6 %
HCT: 35.1 % — ABNORMAL LOW (ref 36.0–46.0)
Hemoglobin: 11 g/dL — ABNORMAL LOW (ref 12.0–15.0)
Immature Granulocytes: 0 %
Lymphocytes Relative: 28 %
Lymphs Abs: 1.5 10*3/uL (ref 0.7–4.0)
MCH: 29.6 pg (ref 26.0–34.0)
MCHC: 31.3 g/dL (ref 30.0–36.0)
MCV: 94.4 fL (ref 80.0–100.0)
Monocytes Absolute: 0.8 10*3/uL (ref 0.1–1.0)
Monocytes Relative: 14 %
Neutro Abs: 2.8 10*3/uL (ref 1.7–7.7)
Neutrophils Relative %: 51 %
Platelet Count: 376 10*3/uL (ref 150–400)
RBC: 3.72 MIL/uL — ABNORMAL LOW (ref 3.87–5.11)
RDW: 14.6 % (ref 11.5–15.5)
WBC Count: 5.5 10*3/uL (ref 4.0–10.5)
nRBC: 0 % (ref 0.0–0.2)
nRBC: 0 /100{WBCs}

## 2023-05-14 LAB — IRON AND TIBC
Iron: 36 ug/dL (ref 28–170)
Saturation Ratios: 7 % — ABNORMAL LOW (ref 10.4–31.8)
TIBC: 521 ug/dL — ABNORMAL HIGH (ref 250–450)
UIBC: 485 ug/dL

## 2023-05-14 LAB — FOLATE: Folate: >40 ng/mL (ref >5.9)

## 2023-05-14 LAB — FERRITIN: Ferritin: 12 ng/mL (ref 11–307)

## 2023-05-14 NOTE — Telephone Encounter (Signed)
05/14/23 Spoke with patient and confirmed next appt.

## 2023-05-28 ENCOUNTER — Encounter: Payer: Self-pay | Admitting: Oncology

## 2023-05-28 NOTE — Progress Notes (Signed)
[facility]  [address] [place],  [place]  [address] [phone]  Clinic Day:  05/14/2023  Referring physician: Audie Pinto, FNP   HISTORY OF PRESENT ILLNESS:  The patient is a 77 y.o. female with iron deficiency anemia.  Her iron and hemoglobin levels significantly improved after receiving IV iron in December 2023.  She comes in today to reassess her iron and hemoglobin levels.  The patient claims she has noticed also getting weaker over the past 2 weeks.  However, she denies having any overt forms of blood loss.  The patient is scheduled to see GI in January 2025 to be evaluated with both an EGD and colonoscopy.   PHYSICAL EXAM:  Blood pressure (!) 153/78, pulse 75, temperature 98 F (36.7 C), resp. rate 14, height 5\' 4"  (1.626 m), weight 192 lb 8 oz (87.3 kg), SpO2 96%. Wt Readings from Last 3 Encounters:  05/14/23 192 lb 8 oz (87.3 kg)  02/12/23 199 lb 1.3 oz (90.3 kg)  02/05/23 194 lb 6.4 oz (88.2 kg)   Body mass index is 33.04 kg/m. Performance status (ECOG): 1 - Symptomatic but completely ambulatory Physical Exam Constitutional:      Appearance: Normal appearance. She is not ill-appearing.  HENT:     Mouth/Throat:     Mouth: Mucous membranes are moist.     Pharynx: Oropharynx is clear. No oropharyngeal exudate or posterior oropharyngeal erythema.  Cardiovascular:     Rate and Rhythm: Normal rate and regular rhythm.     Heart sounds: No murmur heard.    No friction rub. No gallop.  Pulmonary:     Effort: Pulmonary effort is normal. No respiratory distress.     Breath sounds: Normal breath sounds. No wheezing, rhonchi or rales.  Abdominal:     General: Bowel sounds are normal. There is no distension.     Palpations: Abdomen is soft. There is no mass.     Tenderness: There is no abdominal tenderness.  Musculoskeletal:        General: No swelling.     Right lower leg: No edema.     Left lower leg: No edema.  Lymphadenopathy:      Cervical: No cervical adenopathy.     Upper Body:     Right upper body: No supraclavicular or axillary adenopathy.     Left upper body: No supraclavicular or axillary adenopathy.     Lower Body: No right inguinal adenopathy. No left inguinal adenopathy.  Skin:    General: Skin is warm.     Coloration: Skin is not jaundiced.     Findings: No lesion or rash.  Neurological:     General: No focal deficit present.     Mental Status: She is alert and oriented to person, place, and time. Mental status is at baseline.  Psychiatric:        Mood and Affect: Mood normal.        Behavior: Behavior normal.        Thought Content: Thought content normal.   LABS:      Latest Ref Rng & Units 05/14/2023   11:04 AM 02/02/2023   12:00 AM 08/07/2022   12:00 AM  CBC  WBC 4.0 - 10.5 K/uL 5.5  8.0     9.4      Hemoglobin 12.0 - 15.0 g/dL 13.2  9.4     44.0      Hematocrit 36.0 - 46.0 % 35.1  28  39      Platelets 150 - 400 K/uL 376  446     344         This result is from an external source.      Latest Ref Rng & Units 03/02/2022   12:00 AM 11/02/2021   11:48 AM 05/16/2009   10:47 AM  CMP  Glucose 70 - 99 mg/dL  540  981   BUN 4 - 21 17     17  14    Creatinine 0.5 - 1.1 0.7     0.71  0.96   Sodium 137 - 147 137     138  145   Potassium 3.5 - 5.1 mEq/L 4.4     4.0  4.1   Chloride 99 - 108 103     102  110   CO2 13 - 22 25     25  28    Calcium 8.7 - 10.7 9.7     9.8  9.4   Total Protein 6.0 - 8.3 g/dL   6.8   Total Bilirubin 0.3 - 1.2 mg/dL   0.5   Alkaline Phos 25 - 125 34      40   AST 13 - 35 28      42   ALT 7 - 35 U/L 19      35      This result is from an external source.    Latest Reference Range & Units 05/14/23 11:04  Iron 28 - 170 ug/dL 36  UIBC ug/dL 191  TIBC 478 - 295 ug/dL 621 (H)  Saturation Ratios 10.4 - 31.8 % 7 (L)  Ferritin 11 - 307 ng/mL 12  Folate >5.9 ng/mL >40.0  (H): Data is abnormally high (L): Data is abnormally low  ASSESSMENT & PLAN:  Assessment/Plan:  A 77 y.o. female with iron deficiency anemia.  Although her hemoglobin is better, it remains suboptimal.  Furthermore, her iron parameters are still fairly low despite receiving IV iron in September 2024.  Based upon this, I will arrange for her to receive another course of IV iron over these next few weeks to rapidly replenish her iron stores and improve her hemoglobin.  She knows to keep her GI appointment in January 2025, especially if she remains iron deficient.  I will see her back in 4 months for repeat clinical assessment.  The patient understands all the plans discussed today and is in agreement with them.    Janice [name] Kirby Funk, MD

## 2023-05-29 ENCOUNTER — Encounter: Payer: Self-pay | Admitting: Oncology

## 2023-05-29 ENCOUNTER — Telehealth: Payer: Self-pay

## 2023-05-29 ENCOUNTER — Telehealth: Payer: Self-pay | Admitting: Oncology

## 2023-05-29 NOTE — Telephone Encounter (Signed)
Pt notified that Dr Melvyn Neth wants her to have IV iron. Once insurance gives authorization, schedulers will call her to setup appt's. Pt verbalized understanding.

## 2023-05-29 NOTE — Telephone Encounter (Signed)
05/29/23 Spoke with patient and scheduled IV IRON.

## 2023-05-30 ENCOUNTER — Inpatient Hospital Stay: Payer: Medicare HMO | Attending: Oncology

## 2023-05-30 VITALS — BP 143/82 | HR 69 | Temp 98.1°F | Resp 18

## 2023-05-30 DIAGNOSIS — D509 Iron deficiency anemia, unspecified: Secondary | ICD-10-CM | POA: Diagnosis not present

## 2023-05-30 DIAGNOSIS — D508 Other iron deficiency anemias: Secondary | ICD-10-CM

## 2023-05-30 MED ORDER — SODIUM CHLORIDE 0.9% FLUSH
10.0000 mL | Freq: Two times a day (BID) | INTRAVENOUS | Status: DC
  Administered 2023-05-30: 10 mL via INTRAVENOUS

## 2023-05-30 MED ORDER — IRON SUCROSE 20 MG/ML IV SOLN
200.0000 mg | Freq: Once | INTRAVENOUS | Status: AC
  Administered 2023-05-30: 200 mg via INTRAVENOUS
  Filled 2023-05-30: qty 10

## 2023-05-30 NOTE — Patient Instructions (Signed)
Iron Sucrose Injection What is this medication? IRON SUCROSE (EYE ern SOO krose) treats low levels of iron (iron deficiency anemia) in people with kidney disease. Iron is a mineral that plays an important role in making red blood cells, which carry oxygen from your lungs to the rest of your body. This medicine may be used for other purposes; ask your health care provider or pharmacist if you have questions. COMMON BRAND NAME(S): Venofer What should I tell my care team before I take this medication? They need to know if you have any of these conditions: Anemia not caused by low iron levels Heart disease High levels of iron in the blood Kidney disease Liver disease An unusual or allergic reaction to iron, other medications, foods, dyes, or preservatives Pregnant or trying to get pregnant Breastfeeding How should I use this medication? This medication is for infusion into a vein. It is given in a hospital or clinic setting. Talk to your care team about the use of this medication in children. While this medication may be prescribed for children as young as 2 years for selected conditions, precautions do apply. Overdosage: If you think you have taken too much of this medicine contact a poison control center or emergency room at once. NOTE: This medicine is only for you. Do not share this medicine with others. What if I miss a dose? Keep appointments for follow-up doses. It is important not to miss your dose. Call your care team if you are unable to keep an appointment. What may interact with this medication? Do not take this medication with any of the following: Deferoxamine Dimercaprol Other iron products This medication may also interact with the following: Chloramphenicol Deferasirox This list may not describe all possible interactions. Give your health care provider a list of all the medicines, herbs, non-prescription drugs, or dietary supplements you use. Also tell them if you smoke,  drink alcohol, or use illegal drugs. Some items may interact with your medicine. What should I watch for while using this medication? Visit your care team regularly. Tell your care team if your symptoms do not start to get better or if they get worse. You may need blood work done while you are taking this medication. You may need to follow a special diet. Talk to your care team. Foods that contain iron include: whole grains/cereals, dried fruits, beans, or peas, leafy green vegetables, and organ meats (liver, kidney). What side effects may I notice from receiving this medication? Side effects that you should report to your care team as soon as possible: Allergic reactions--skin rash, itching, hives, swelling of the face, lips, tongue, or throat Low blood pressure--dizziness, feeling faint or lightheaded, blurry vision Shortness of breath Side effects that usually do not require medical attention (report to your care team if they continue or are bothersome): Flushing Headache Joint pain Muscle pain Nausea Pain, redness, or irritation at injection site This list may not describe all possible side effects. Call your doctor for medical advice about side effects. You may report side effects to FDA at 1-800-FDA-1088. Where should I keep my medication? This medication is given in a hospital or clinic and will not be stored at home. NOTE: This sheet is a summary. It may not cover all possible information. If you have questions about this medicine, talk to your doctor, pharmacist, or health care provider.  2023 Elsevier/Gold Standard (2020-09-16 00:00:00)  

## 2023-06-01 ENCOUNTER — Inpatient Hospital Stay: Payer: Medicare HMO

## 2023-06-01 VITALS — BP 125/67 | HR 77 | Temp 98.0°F | Resp 18

## 2023-06-01 DIAGNOSIS — D508 Other iron deficiency anemias: Secondary | ICD-10-CM

## 2023-06-01 DIAGNOSIS — D509 Iron deficiency anemia, unspecified: Secondary | ICD-10-CM | POA: Diagnosis not present

## 2023-06-01 MED ORDER — SODIUM CHLORIDE 0.9% FLUSH
10.0000 mL | Freq: Two times a day (BID) | INTRAVENOUS | Status: DC
  Administered 2023-06-01: 10 mL via INTRAVENOUS

## 2023-06-01 MED ORDER — IRON SUCROSE 20 MG/ML IV SOLN
200.0000 mg | Freq: Once | INTRAVENOUS | Status: AC
  Administered 2023-06-01: 200 mg via INTRAVENOUS
  Filled 2023-06-01: qty 10

## 2023-06-04 ENCOUNTER — Inpatient Hospital Stay: Payer: Medicare HMO

## 2023-06-04 VITALS — BP 133/67 | HR 77 | Temp 98.0°F | Resp 18

## 2023-06-04 DIAGNOSIS — D508 Other iron deficiency anemias: Secondary | ICD-10-CM

## 2023-06-04 DIAGNOSIS — D509 Iron deficiency anemia, unspecified: Secondary | ICD-10-CM | POA: Diagnosis not present

## 2023-06-04 MED ORDER — IRON SUCROSE 20 MG/ML IV SOLN
200.0000 mg | Freq: Once | INTRAVENOUS | Status: AC
Start: 2023-06-04 — End: 2023-06-04
  Administered 2023-06-04: 200 mg via INTRAVENOUS
  Filled 2023-06-04: qty 10

## 2023-06-04 MED ORDER — SODIUM CHLORIDE 0.9% FLUSH
10.0000 mL | Freq: Once | INTRAVENOUS | Status: AC | PRN
Start: 2023-06-04 — End: 2023-06-04
  Administered 2023-06-04: 10 mL

## 2023-06-04 NOTE — Patient Instructions (Signed)
Iron Sucrose Injection What is this medication? IRON SUCROSE (EYE ern SOO krose) treats low levels of iron (iron deficiency anemia) in people with kidney disease. Iron is a mineral that plays an important role in making red blood cells, which carry oxygen from your lungs to the rest of your body. This medicine may be used for other purposes; ask your health care provider or pharmacist if you have questions. COMMON BRAND NAME(S): Venofer What should I tell my care team before I take this medication? They need to know if you have any of these conditions: Anemia not caused by low iron levels Heart disease High levels of iron in the blood Kidney disease Liver disease An unusual or allergic reaction to iron, other medications, foods, dyes, or preservatives Pregnant or trying to get pregnant Breastfeeding How should I use this medication? This medication is for infusion into a vein. It is given in a hospital or clinic setting. Talk to your care team about the use of this medication in children. While this medication may be prescribed for children as young as 2 years for selected conditions, precautions do apply. Overdosage: If you think you have taken too much of this medicine contact a poison control center or emergency room at once. NOTE: This medicine is only for you. Do not share this medicine with others. What if I miss a dose? Keep appointments for follow-up doses. It is important not to miss your dose. Call your care team if you are unable to keep an appointment. What may interact with this medication? Do not take this medication with any of the following: Deferoxamine Dimercaprol Other iron products This medication may also interact with the following: Chloramphenicol Deferasirox This list may not describe all possible interactions. Give your health care provider a list of all the medicines, herbs, non-prescription drugs, or dietary supplements you use. Also tell them if you smoke,  drink alcohol, or use illegal drugs. Some items may interact with your medicine. What should I watch for while using this medication? Visit your care team regularly. Tell your care team if your symptoms do not start to get better or if they get worse. You may need blood work done while you are taking this medication. You may need to follow a special diet. Talk to your care team. Foods that contain iron include: whole grains/cereals, dried fruits, beans, or peas, leafy green vegetables, and organ meats (liver, kidney). What side effects may I notice from receiving this medication? Side effects that you should report to your care team as soon as possible: Allergic reactions--skin rash, itching, hives, swelling of the face, lips, tongue, or throat Low blood pressure--dizziness, feeling faint or lightheaded, blurry vision Shortness of breath Side effects that usually do not require medical attention (report to your care team if they continue or are bothersome): Flushing Headache Joint pain Muscle pain Nausea Pain, redness, or irritation at injection site This list may not describe all possible side effects. Call your doctor for medical advice about side effects. You may report side effects to FDA at 1-800-FDA-1088. Where should I keep my medication? This medication is given in a hospital or clinic. It will not be stored at home. NOTE: This sheet is a summary. It may not cover all possible information. If you have questions about this medicine, talk to your doctor, pharmacist, or health care provider.  2024 Elsevier/Gold Standard (2022-11-10 00:00:00)

## 2023-06-07 ENCOUNTER — Inpatient Hospital Stay: Payer: Medicare HMO

## 2023-06-07 VITALS — BP 137/73 | HR 84 | Temp 97.7°F | Resp 18

## 2023-06-07 DIAGNOSIS — D508 Other iron deficiency anemias: Secondary | ICD-10-CM

## 2023-06-07 DIAGNOSIS — D509 Iron deficiency anemia, unspecified: Secondary | ICD-10-CM | POA: Diagnosis not present

## 2023-06-07 MED ORDER — IRON SUCROSE 20 MG/ML IV SOLN
200.0000 mg | Freq: Once | INTRAVENOUS | Status: AC
Start: 2023-06-07 — End: 2023-06-07
  Administered 2023-06-07: 200 mg via INTRAVENOUS
  Filled 2023-06-07: qty 10

## 2023-06-07 NOTE — Patient Instructions (Signed)
Iron Sucrose Injection What is this medication? IRON SUCROSE (EYE ern SOO krose) treats low levels of iron (iron deficiency anemia) in people with kidney disease. Iron is a mineral that plays an important role in making red blood cells, which carry oxygen from your lungs to the rest of your body. This medicine may be used for other purposes; ask your health care provider or pharmacist if you have questions. COMMON BRAND NAME(S): Venofer What should I tell my care team before I take this medication? They need to know if you have any of these conditions: Anemia not caused by low iron levels Heart disease High levels of iron in the blood Kidney disease Liver disease An unusual or allergic reaction to iron, other medications, foods, dyes, or preservatives Pregnant or trying to get pregnant Breastfeeding How should I use this medication? This medication is for infusion into a vein. It is given in a hospital or clinic setting. Talk to your care team about the use of this medication in children. While this medication may be prescribed for children as young as 2 years for selected conditions, precautions do apply. Overdosage: If you think you have taken too much of this medicine contact a poison control center or emergency room at once. NOTE: This medicine is only for you. Do not share this medicine with others. What if I miss a dose? Keep appointments for follow-up doses. It is important not to miss your dose. Call your care team if you are unable to keep an appointment. What may interact with this medication? Do not take this medication with any of the following: Deferoxamine Dimercaprol Other iron products This medication may also interact with the following: Chloramphenicol Deferasirox This list may not describe all possible interactions. Give your health care provider a list of all the medicines, herbs, non-prescription drugs, or dietary supplements you use. Also tell them if you smoke,  drink alcohol, or use illegal drugs. Some items may interact with your medicine. What should I watch for while using this medication? Visit your care team regularly. Tell your care team if your symptoms do not start to get better or if they get worse. You may need blood work done while you are taking this medication. You may need to follow a special diet. Talk to your care team. Foods that contain iron include: whole grains/cereals, dried fruits, beans, or peas, leafy green vegetables, and organ meats (liver, kidney). What side effects may I notice from receiving this medication? Side effects that you should report to your care team as soon as possible: Allergic reactions--skin rash, itching, hives, swelling of the face, lips, tongue, or throat Low blood pressure--dizziness, feeling faint or lightheaded, blurry vision Shortness of breath Side effects that usually do not require medical attention (report to your care team if they continue or are bothersome): Flushing Headache Joint pain Muscle pain Nausea Pain, redness, or irritation at injection site This list may not describe all possible side effects. Call your doctor for medical advice about side effects. You may report side effects to FDA at 1-800-FDA-1088. Where should I keep my medication? This medication is given in a hospital or clinic. It will not be stored at home. NOTE: This sheet is a summary. It may not cover all possible information. If you have questions about this medicine, talk to your doctor, pharmacist, or health care provider.  2024 Elsevier/Gold Standard (2022-11-10 00:00:00)

## 2023-06-11 ENCOUNTER — Inpatient Hospital Stay: Payer: Medicare HMO

## 2023-06-11 VITALS — BP 156/70 | HR 79 | Temp 97.7°F | Resp 18

## 2023-06-11 DIAGNOSIS — D509 Iron deficiency anemia, unspecified: Secondary | ICD-10-CM | POA: Diagnosis not present

## 2023-06-11 DIAGNOSIS — D508 Other iron deficiency anemias: Secondary | ICD-10-CM

## 2023-06-11 MED ORDER — IRON SUCROSE 20 MG/ML IV SOLN
200.0000 mg | Freq: Once | INTRAVENOUS | Status: AC
Start: 2023-06-11 — End: 2023-06-11
  Administered 2023-06-11: 200 mg via INTRAVENOUS
  Filled 2023-06-11: qty 10

## 2023-06-15 DIAGNOSIS — N390 Urinary tract infection, site not specified: Secondary | ICD-10-CM | POA: Diagnosis not present

## 2023-06-15 DIAGNOSIS — N3281 Overactive bladder: Secondary | ICD-10-CM | POA: Diagnosis not present

## 2023-06-15 DIAGNOSIS — N3941 Urge incontinence: Secondary | ICD-10-CM | POA: Diagnosis not present

## 2023-06-15 DIAGNOSIS — N952 Postmenopausal atrophic vaginitis: Secondary | ICD-10-CM | POA: Diagnosis not present

## 2023-06-29 ENCOUNTER — Encounter: Payer: Self-pay | Admitting: Gastroenterology

## 2023-06-29 ENCOUNTER — Ambulatory Visit (INDEPENDENT_AMBULATORY_CARE_PROVIDER_SITE_OTHER): Payer: Medicare HMO | Admitting: Gastroenterology

## 2023-06-29 VITALS — BP 110/70 | HR 83 | Ht 64.0 in | Wt 195.0 lb

## 2023-06-29 DIAGNOSIS — D509 Iron deficiency anemia, unspecified: Secondary | ICD-10-CM | POA: Diagnosis not present

## 2023-06-29 DIAGNOSIS — D638 Anemia in other chronic diseases classified elsewhere: Secondary | ICD-10-CM

## 2023-06-29 DIAGNOSIS — K219 Gastro-esophageal reflux disease without esophagitis: Secondary | ICD-10-CM | POA: Diagnosis not present

## 2023-06-29 MED ORDER — PANTOPRAZOLE SODIUM 40 MG PO TBEC: 40.0000 mg | DELAYED_RELEASE_TABLET | Freq: Every day | ORAL | 4 refills | Status: AC

## 2023-06-29 MED ORDER — NA SULFATE-K SULFATE-MG SULF 17.5-3.13-1.6 GM/177ML PO SOLN: 1.0000 | Freq: Once | ORAL | 0 refills | Status: AC

## 2023-06-29 NOTE — Progress Notes (Signed)
 Chief Complaint: IDA  Referring Provider:  [name], FNP      ASSESSMENT AND PLAN;   #1. 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  #2. GERD  Plan: -Hemoccult cards x 3  -EGD/colon -Protonix  40mg  po every day -If neg, CT AP +/- VCE -Records for EGI    I discussed EGD/Colonoscopy- the indications, risks, alternatives and potential complications including, but not limited to bleeding, infection, reaction to meds, damage to internal organs, cardiac and/or pulmonary problems, and perforation requiring surgery. The possibility that significant findings could be missed was explained. All ? were answered. Pt consents to proceed. HPI:    Janice [name] is a 78 y.o. female  With history of rheumatoid arthritis on Rinvoq, bronchiectasis, CAD with MI 2010, rheumatoid arthritis, history of prior PE (provoked after she broke her foot/casted and then placed on OCP, told clots were small), chronic back pain/osteoarthritis on Voltaren, GERD  For IDA, referred by Dr. Ezzard, has been receiving IV iron  on regular basis.  Per patient heme-negative stools.  Iron  studies 04/2023 with saturation 7%, TIBC 521, ferritin 12 despite IV iron . Hb improved from 9.4 to 11.0.  Being closely followed by Dr. Ezzard.  No nausea, vomiting, heartburn, regurgitation, odynophagia or dysphagia.  No significant diarrhea. Has occ constipation.  No melena or hematochezia. No unintentional weight loss. No abdominal pain.   The patient has a history of foot surgery and hysterectomy, and has seen a cardiologist, Dr. Lonni Bristle, as recently as October of the previous year.   Had remote history of endoscopic procedures by Dr. Rosalie at [facility] records available currently.    Family history of colon cancer in maternal grandmother above age 55.  No sodas, chocolates, chewing gums, artificial sweeteners and candy. No NSAIDs except occasional Voltaren  Past Medical History:  Diagnosis Date   Acute  myocardial infarction, unspecified site, episode of care unspecified    Anemia    Arthritis, rheumatoid (HCC)    hx   Back pain, chronic    GERD (gastroesophageal reflux disease)    Pulmonary embolism (HCC)    Scoliosis    Skin cancer     Past Surgical History:  Procedure Laterality Date   bladder tack     BUNIONECTOMY Bilateral    COLONOSCOPY W/ POLYPECTOMY     HERNIA REPAIR     RECTOCELE REPAIR     TONSILLECTOMY     TUBAL LIGATION      Family History  Problem Relation Age of Onset   Lung cancer Mother    Esophageal cancer Mother    Colon cancer Maternal Grandmother     Social History   Tobacco Use   Smoking status: Former   Smokeless tobacco: Never   Tobacco comments:    has 7 plus pack-year smoking history, but quit in 1981.   Vaping Use   Vaping status: Never Used  Substance Use Topics   Alcohol use: Not Currently    Comment: rare etoh    Drug use: No    Current Outpatient Medications  Medication Sig Dispense Refill   ALPRAZolam (XANAX) 0.5 MG tablet Take 0.5 mg by mouth daily as needed for anxiety.     aspirin EC 81 MG tablet Take 81 mg by mouth daily.     Biotin 5000 MCG TABS Take 5,000 mcg by mouth daily.     Calcium Carb-Cholecalciferol 600-5 MG-MCG TABS 1 tablet with food Orally Once a day     Cholecalciferol (VITAMIN D3  PO) Take 1 capsule by mouth daily.     diclofenac (VOLTAREN) 50 MG EC tablet Take 50 mg by mouth 2 (two) times daily.     estradiol (ESTRACE) 0.1 MG/GM vaginal cream SMARTSIG:1 sparingly Vaginal Every Night     folic acid  (FOLVITE ) 1 MG tablet Take 1 mg by mouth daily.     Multiple Vitamin (QUINTABS) TABS Take 1 tablet by mouth at bedtime.     Multiple Vitamins-Minerals (MULTIVITAL) tablet Take 1 tablet by mouth daily.       pantoprazole  (PROTONIX ) 40 MG tablet Take 1 tablet by mouth at bedtime.     RINVOQ 15 MG TB24 Take by mouth.     traMADol (ULTRAM) 50 MG tablet Take 50 mg by mouth 2 (two) times daily as needed.      budesonide -formoterol  (SYMBICORT ) 160-4.5 MCG/ACT inhaler Inhale 2 puffs into the lungs 2 (two) times daily. INHALE 2 PUFFS INTO THE LUNGS TWICE A DAY (Patient not taking: Reported on 06/29/2023) 10.2 each 2   clotrimazole (MYCELEX) 10 MG troche Suck and swallow one troche five times daily. (Patient not taking: Reported on 06/29/2023)     famotidine (PEPCID) 20 MG tablet Take 20 mg by mouth daily. (Patient not taking: Reported on 06/29/2023)     ipratropium-albuterol  (DUONEB) 0.5-2.5 (3) MG/3ML SOLN NEBULIZE CONTENTS OF 1 VIAL EVERY 6 HOURS AS NEEDED (Patient not taking: Reported on 06/29/2023) 360 mL 1   MYRBETRIQ 50 MG TB24 tablet Take 50 mg by mouth daily. (Patient not taking: Reported on 06/29/2023)     nitroGLYCERIN  (NITROSTAT ) 0.4 MG SL tablet PLACE 1 TABLET (0.4 MG TOTAL) UNDER THE TONGUE EVERY 5 (FIVE) MINUTES AS NEEDED FOR CHEST PAIN. (Patient not taking: Reported on 06/29/2023) 25 tablet 3   No current facility-administered medications for this visit.    Allergies  Allergen Reactions   Erythromycin     Review of Systems:  Constitutional: Denies fever, chills, diaphoresis, appetite change and fatigue.  HEENT: none   Respiratory: Denies SOB, DOE, has cough, No chest tightness,  and wheezing.   Cardiovascular: Denies chest pain, palpitations and leg swelling.  Genitourinary: Denies dysuria, urgency, frequency, hematuria, flank pain and difficulty urinating.  Musculoskeletal: Denies myalgias, has back pain, joint swelling, arthralgias and gait problem.  Skin: No rash.  Neurological: Denies dizziness, seizures, syncope, weakness, light-headedness, numbness and headaches.  Hematological: Denies adenopathy. Easy bruising, personal or family bleeding history  Psychiatric/Behavioral: Has anxiety or depression     Physical Exam:    BP 110/70   Pulse 83   Ht 5' 4 (1.626 m)   Wt 195 lb (88.5 kg)   BMI 33.47 kg/m  Wt Readings from Last 3 Encounters:  06/29/23 195 lb (88.5 kg)  05/14/23  192 lb 8 oz (87.3 kg)  02/12/23 199 lb 1.3 oz (90.3 kg)   Constitutional:  Well-developed, in no acute distress. Psychiatric: Normal mood and affect. Behavior is normal. HEENT: Pupils normal.  Conjunctivae are normal. No scleral icterus. Cardiovascular: Normal rate, regular rhythm. No edema Pulmonary/chest: Effort normal and breath sounds-decreased. no wheezing, rales or rhonchi. Abdominal: Soft, nondistended. Nontender. Bowel sounds active throughout. There are no masses palpable. No hepatomegaly. Rectal: Deferred-to be performed at the time of colonoscopy Neurological: Alert and oriented to person place and time. Skin: Skin is warm and dry. No rashes noted.  Data Reviewed: I have personally reviewed following labs and imaging studies  CBC:    Latest Ref Rng & Units 05/14/2023   11:04 AM 02/02/2023  12:00 AM 08/07/2022   12:00 AM  CBC  WBC 4.0 - 10.5 K/uL 5.5  8.0     9.4      Hemoglobin 12.0 - 15.0 g/dL 88.9  9.4     87.4      Hematocrit 36.0 - 46.0 % 35.1  28     39      Platelets 150 - 400 K/uL 376  446     344         This result is from an external source.    CMP:    Latest Ref Rng & Units 03/02/2022   12:00 AM 11/02/2021   11:48 AM 05/16/2009   10:47 AM  CMP  Glucose 70 - 99 mg/dL  885  886   BUN 4 - 21 17     17  14    Creatinine 0.5 - 1.1 0.7     0.71  0.96   Sodium 137 - 147 137     138  145   Potassium 3.5 - 5.1 mEq/L 4.4     4.0  4.1   Chloride 99 - 108 103     102  110   CO2 13 - 22 25     25  28    Calcium 8.7 - 10.7 9.7     9.8  9.4   Total Protein 6.0 - 8.3 g/dL   6.8   Total Bilirubin 0.3 - 1.2 mg/dL   0.5   Alkaline Phos 25 - 125 34      40   AST 13 - 35 28      42   ALT 7 - 35 U/L 19      35      This result is from an external source.       Anselm Bring, MD 06/29/2023, 9:28 AM  Cc: [name], FNP '

## 2023-06-29 NOTE — Patient Instructions (Addendum)
 _______________________________________________________  If your blood pressure at your visit was 140/90 or greater, please contact your primary care physician to follow up on this.  _______________________________________________________  If you are age 77 or older, your body mass index should be between 23-30. Your Body mass index is 33.47 kg/m. If this is out of the aforementioned range listed, please consider follow up with your Primary Care Provider.  If you are age 20 or younger, your body mass index should be between 19-25. Your Body mass index is 33.47 kg/m. If this is out of the aformentioned range listed, please consider follow up with your Primary Care Provider.   ________________________________________________________  The [facility] providers would like to encourage you to use MYCHART to communicate with providers for non-urgent requests or questions.  Due to long hold times on the telephone, sending your provider a message by [facility] may be a faster and more efficient way to get a response.  Please allow 48 business hours for a response.  Please remember that this is for non-urgent requests.  _______________________________________________________  Follow the instructions on the Hemoccult cards and mail them back to us  when you are finished or you may take them directly to the lab in the basement of the [place] building. We will call you with the results.   We have sent the following medications to your pharmacy for you to pick up at your convenience: Suprep  Continue Protonix   You have been scheduled for an endoscopy and colonoscopy. Please follow the written instructions given to you at your visit today.  Please pick up your prep supplies at the pharmacy within the next 1-3 days.  If you use inhalers (even only as needed), please bring them with you on the day of your procedure.  DO NOT TAKE 7 DAYS PRIOR TO TEST- Trulicity (dulaglutide) Ozempic, Wegovy  (semaglutide) Mounjaro (tirzepatide) Bydureon Bcise (exanatide extended release)  DO NOT TAKE 1 DAY PRIOR TO YOUR TEST Rybelsus (semaglutide) Adlyxin (lixisenatide) Victoza (liraglutide) Byetta (exanatide) ___________________________________________________________________________

## 2023-07-06 ENCOUNTER — Encounter: Payer: Self-pay | Admitting: Gastroenterology

## 2023-07-06 NOTE — Telephone Encounter (Signed)
Did you want a clearance for this patient for her procedure?

## 2023-07-10 DIAGNOSIS — J479 Bronchiectasis, uncomplicated: Secondary | ICD-10-CM | POA: Diagnosis not present

## 2023-07-17 NOTE — Telephone Encounter (Signed)
Lets get cardiac clearance just to be on the safer side RG

## 2023-07-18 ENCOUNTER — Telehealth: Payer: Self-pay

## 2023-07-18 NOTE — Telephone Encounter (Signed)
Thanks for letting me know Appreciate your help RG

## 2023-07-18 NOTE — Telephone Encounter (Signed)
Cardiac clearance made

## 2023-07-18 NOTE — Telephone Encounter (Signed)
   Name: Janice [name]  DOB: Dec 02, 1945  MRN: [id number]  Primary Cardiologist: Jodelle Red, MD  Last seen in 2023.  Chart reviewed as part of pre-operative protocol coverage. Because of Ameshia Pewitt Passmore's past medical history and time since last visit, she will require a follow-up in-office visit in order to better assess preoperative cardiovascular risk.  Pre-op covering staff: - Please schedule appointment and call patient to inform them. If patient already had an upcoming appointment within acceptable timeframe, please add "pre-op clearance" to the appointment notes so provider is aware. - Please contact requesting surgeon's office via preferred method (i.e, phone, fax) to inform them of need for appointment prior to surgery.  Tereso Newcomer, PA-C  07/18/2023, 11:58 AM

## 2023-07-18 NOTE — Telephone Encounter (Signed)
[facility] Pre-operative Risk Assessment     Request for surgical clearance:     Endoscopy Procedure  What type of surgery is being performed?     EGD/Colon  When is this surgery scheduled?     08-28-23  What type of clearance is required ?   Medical  Are there any medications that need to be held prior to surgery and how long?  We need to make sure patient is able to have the EGD/Colon  Practice name and name of physician performing surgery?      [facility]  What is your office phone and fax number?      Phone- [phone]  Fax- [phone]  Anesthesia type (None, local, MAC, general) ?       MAC   Please route your response to [facility])

## 2023-07-18 NOTE — Telephone Encounter (Signed)
Pt has been scheduled in office per preop APP Tereso Newcomer, [facility] appt 08/13/23 with Gillian Shields, NP.   I will update all parties involved.

## 2023-08-06 ENCOUNTER — Ambulatory Visit: Payer: Medicare HMO | Admitting: Pulmonary Disease

## 2023-08-06 DIAGNOSIS — J479 Bronchiectasis, uncomplicated: Secondary | ICD-10-CM | POA: Diagnosis not present

## 2023-08-06 LAB — PULMONARY FUNCTION TEST
DL/VA % pred: 124 %
DL/VA: 5.09 ml/min/mmHg/L
DLCO cor % pred: 101 %
DLCO cor: 19.26 ml/min/mmHg
DLCO unc % pred: 92 %
DLCO unc: 17.68 ml/min/mmHg
FEF 25-75 Post: 1.33 L/s
FEF 25-75 Pre: 1.17 L/s
FEF2575-%Change-Post: 13 %
FEF2575-%Pred-Post: 82 %
FEF2575-%Pred-Pre: 72 %
FEV1-%Change-Post: 1 %
FEV1-%Pred-Post: 84 %
FEV1-%Pred-Pre: 83 %
FEV1-Post: 1.75 L
FEV1-Pre: 1.73 L
FEV1FVC-%Change-Post: -4 %
FEV1FVC-%Pred-Pre: 99 %
FEV6-%Change-Post: 4 %
FEV6-%Pred-Post: 92 %
FEV6-%Pred-Pre: 88 %
FEV6-Post: 2.44 L
FEV6-Pre: 2.33 L
FEV6FVC-%Change-Post: 0 %
FEV6FVC-%Pred-Post: 104 %
FEV6FVC-%Pred-Pre: 105 %
FVC-%Change-Post: 5 %
FVC-%Pred-Post: 88 %
FVC-%Pred-Pre: 84 %
FVC-Post: 2.47 L
FVC-Pre: 2.33 L
Post FEV1/FVC ratio: 71 %
Post FEV6/FVC ratio: 99 %
Pre FEV1/FVC ratio: 74 %
Pre FEV6/FVC Ratio: 100 %
RV % pred: 79 %
RV: 1.84 L
TLC % pred: 80 %
TLC: 4.08 L

## 2023-08-06 NOTE — Progress Notes (Signed)
 Full PFT performed today.

## 2023-08-06 NOTE — Patient Instructions (Signed)
 Full PFT performed today.

## 2023-08-09 ENCOUNTER — Other Ambulatory Visit (INDEPENDENT_AMBULATORY_CARE_PROVIDER_SITE_OTHER): Payer: Medicare HMO

## 2023-08-09 DIAGNOSIS — D509 Iron deficiency anemia, unspecified: Secondary | ICD-10-CM

## 2023-08-09 LAB — HEMOCCULT SLIDES (X 3 CARDS)
Fecal Occult Blood: NEGATIVE
OCCULT 1: NEGATIVE
OCCULT 2: NEGATIVE
OCCULT 3: NEGATIVE
OCCULT 4: NEGATIVE
OCCULT 5: NEGATIVE

## 2023-08-13 ENCOUNTER — Ambulatory Visit (HOSPITAL_BASED_OUTPATIENT_CLINIC_OR_DEPARTMENT_OTHER): Payer: Medicare HMO | Admitting: Family

## 2023-08-13 VITALS — BP 134/86 | HR 73 | Ht 64.0 in | Wt 197.0 lb

## 2023-08-13 DIAGNOSIS — I25118 Atherosclerotic heart disease of native coronary artery with other forms of angina pectoris: Secondary | ICD-10-CM

## 2023-08-13 DIAGNOSIS — Z01818 Encounter for other preprocedural examination: Secondary | ICD-10-CM | POA: Diagnosis not present

## 2023-08-13 NOTE — Progress Notes (Unsigned)
 Cardiology Office Note:  .   Date:  08/16/2023  ID:  Janice [name], Janice [name] 1946-01-31, MRN [id number] PCP: Audie Pinto, FNP  [facility] Providers Cardiologist:  Jodelle Red, MD    History of Present Illness: .   Janice [name] is a 78 y.o. female with hx of CAD with MI 2010 with nonobstructive disease, RA, prior PE (provoked after broken/casted foot).   Echo and myoview 02/2019 unremarkable. Last seen by Dr. Cristal Deer 09/2021 doing well from a cardiac perspective. Previous concern with lipid lowering therapy while on methotrexate.   Presents today for preop for EGD/colonoscopy. Doing well since last seen. Reports no shortness of breath nor dyspnea on exertion. Reports no chest pain, pressure, or tightness. No edema, orthopnea, PND. Reports no palpitations.    ROS: Please see the history of present illness.    All other systems reviewed and are negative.   Studies Reviewed: Marland Kitchen   EKG Interpretation Date/Time:  Monday August 13 2023 12:05:26 EST Ventricular Rate:  73 PR Interval:  140 QRS Duration:  74 QT Interval:  388 QTC Calculation: 427 R Axis:   6  Text Interpretation: Normal sinus rhythm Confirmed by Gillian Shields ([id number]) on 08/13/2023 12:13:27 PM    Cardiac Studies & Procedures   ______________________________________________________________________________________________   STRESS TESTS  MYOCARDIAL PERFUSION IMAGING 03/05/2019  Narrative  Nuclear stress EF: 47%. visually, the EF appears to be greater than 47% and appears to be normal  There was no ST segment deviation noted during stress.  This is a low risk study. no evidence of ischemia or previous infarction  The study is normal.   ECHOCARDIOGRAM  ECHOCARDIOGRAM COMPLETE 03/05/2019  Narrative ECHOCARDIOGRAM REPORT    Patient Name:   Janice [name] Date of Exam: 03/05/2019 Medical Rec #:  [id number]         Height:       66.0 in Accession #:    [id number]         Weight:       215.8 lb Date of Birth:  08-16-1945         BSA:          2.07 m Patient Age:    72 years          BP:           132/85 mmHg Patient Gender: F                 HR:           64 bpm. Exam Location:  [address]   Procedure: 2D Echo, Cardiac Doppler and Color Doppler  Indications:    R06.02 Shortness of breath  History:        Patient has prior history of Echocardiogram examinations, most recent 05/21/2009. Risk Factors: Former Smoker. Myocardial Infarction. Pulmonary embolism. Scoliosis.  Sonographer:    Daphine Deutscher RDCS Referring Phys: [id number] BRIDGETTE CHRISTOPHER  IMPRESSIONS   1. The left ventricle has normal systolic function, with an ejection fraction of 55-60%. The cavity size was normal. Left ventricular diastolic Doppler parameters are consistent with impaired relaxation. 2. The right ventricle has normal systolic function. The cavity was normal. There is no increase in right ventricular wall thickness. 3. The mitral valve is grossly normal. There is mild mitral annular calcification present. 4. The aortic valve is tricuspid. Moderate thickening of the aortic valve. Moderate calcification of the aortic valve. Aortic valve regurgitation is trivial by color flow Doppler. 5. The aorta is  normal unless otherwise noted.  FINDINGS Left Ventricle: The left ventricle has normal systolic function, with an ejection fraction of 55-60%. The cavity size was normal. There is no increase in left ventricular wall thickness. Left ventricular diastolic Doppler parameters are consistent with impaired relaxation.  Right Ventricle: The right ventricle has normal systolic function. The cavity was normal. There is no increase in right ventricular wall thickness.  Left Atrium: Left atrial size was normal in size.  Right Atrium: Right atrial size was normal in size. Right atrial pressure is estimated at 10 mmHg.  Interatrial Septum: No atrial level shunt detected by color  flow Doppler.  Pericardium: There is no evidence of pericardial effusion.  Mitral Valve: The mitral valve is grossly normal. There is mild mitral annular calcification present. Mitral valve regurgitation is not visualized by color flow Doppler.  Tricuspid Valve: The tricuspid valve is normal in structure. Tricuspid valve regurgitation is trivial by color flow Doppler.  Aortic Valve: The aortic valve is tricuspid Moderate thickening of the aortic valve. Moderate calcification of the aortic valve. Aortic valve regurgitation is trivial by color flow Doppler. There is no evidence of aortic valve stenosis.  Pulmonic Valve: The pulmonic valve was normal in structure. Pulmonic valve regurgitation is not visualized by color flow Doppler.  Aorta: The aorta is normal unless otherwise noted.  Venous: The inferior vena cava is normal in size with greater than 50% respiratory variability.   +--------------+--------++ LEFT VENTRICLE         +----------------+---------++ +--------------+--------++ Diastology                PLAX 2D                +----------------+---------++ +--------------+--------++ LV e' lateral:  6.31 cm/s LVIDd:        4.30 cm  +----------------+---------++ +--------------+--------++ LV E/e' lateral:7.9       LVIDs:        2.80 cm  +----------------+---------++ +--------------+--------++ LV e' medial:   5.87 cm/s LV PW:        1.10 cm  +----------------+---------++ +--------------+--------++ LV E/e' medial: 8.5       LV IVS:       1.10 cm  +----------------+---------++ +--------------+--------++ LVOT diam:    2.10 cm  +--------------+--------++ LV SV:        54 ml    +--------------+--------++ LV SV Index:  24.62    +--------------+--------++ LVOT Area:    3.46 cm +--------------+--------++                        +--------------+--------++  +---------------+----------++ RIGHT VENTRICLE            +---------------+----------++ RV Basal diam: 3.60 cm    +---------------+----------++ RV S prime:    17.30 cm/s +---------------+----------++ TAPSE (M-mode):1.8 cm     +---------------+----------++  +---------------+-------++-----------++ LEFT ATRIUM           Index       +---------------+-------++-----------++ LA diam:       4.50 cm2.18 cm/m  +---------------+-------++-----------++ LA Vol (A2C):  37.4 ml18.11 ml/m +---------------+-------++-----------++ LA Vol (A4C):  60.6 ml29.34 ml/m +---------------+-------++-----------++ LA Biplane Vol:48.9 ml23.68 ml/m +---------------+-------++-----------++ +------------+--------++-----------++ RIGHT ATRIUM        Index       +------------+--------++-----------++ RA Area:    9.97 cm            +------------+--------++-----------++ RA Volume:  23.00 ml11.14 ml/m +------------+--------++-----------++ +------------+-----------++ AORTIC VALVE            +------------+-----------++  LVOT Vmax:  95.80 cm/s  +------------+-----------++ LVOT Vmean: 63.000 cm/s +------------+-----------++ LVOT VTI:   0.200 m     +------------+-----------++  +-------------+-------++ AORTA                +-------------+-------++ Ao Root diam:3.40 cm +-------------+-------++ Ao Asc diam: 3.10 cm +-------------+-------++  +--------------+----------++ +---------------+-----------++ MITRAL VALVE             TRICUSPID VALVE            +--------------+----------++ +---------------+-----------++ MV Area (PHT):3.53 cm   TR Peak grad:  20.9 mmHg   +--------------+----------++ +---------------+-----------++ MV PHT:       62.35 msec TR Vmax:       240.00 cm/s +--------------+----------++ +---------------+-----------++ MV Decel Time:215 msec   +--------------+----------++ +--------------+-------+ +--------------+----------++ SHUNTS                 MV E velocity:49.70 cm/s +--------------+-------+ +--------------+----------++ Systemic VTI: 0.20 m  MV A velocity:87.40 cm/s +--------------+-------+ +--------------+----------++ Systemic Diam:2.10 cm MV E/A ratio: 0.57       +--------------+-------+ +--------------+----------++   Donato Schultz MD Electronically signed by Donato Schultz MD Signature Date/Time: 03/05/2019/10:51:27 AM    Final          ______________________________________________________________________________________________      Risk Assessment/Calculations:            Physical Exam:   VS:  BP 134/86   Pulse 73   Ht 5\' 4"  (1.626 m)   Wt 197 lb (89.4 kg)   BMI 33.81 kg/m    Wt Readings from Last 3 Encounters:  08/13/23 197 lb (89.4 kg)  06/29/23 195 lb (88.5 kg)  05/14/23 192 lb 8 oz (87.3 kg)    GEN: Well nourished, well developed in no acute distress NECK: No JVD; No carotid bruits CARDIAC: RRR, no murmurs, rubs, gallops RESPIRATORY:  Clear to auscultation without rales, wheezing or rhonchi  ABDOMEN: Soft, non-tender, non-distended EXTREMITIES:  No edema; No deformity   ASSESSMENT AND PLAN: .    CAD - Stable with no anginal symptoms. No indication for ischemic evaluation. EKG today NSR with no acute ST/T wave changes. GDMT aspirin. Lipids presently managed through lifestyle due to prior concerns with statin and RA. Recommend aiming for 150 minutes of moderate intensity activity per week and following a heart healthy diet.    Preop clearance - According to the Revised Cardiac Risk Index (RCRI), her Perioperative Risk of Major Cardiac Event is (%): 0.4. Her Functional Capacity in METs is: 6.61 according to the [facility] Activity Status Index (DASI). Per AHA/ACC guidelines, she is deemed acceptable risk for the planned procedure without additional cardiovascular testing. Will route to surgical team so they are aware.  Permissible to hold Aspirin 7 days prior.        Dispo:  follow up in 1 year  Signed, Alver Sorrow, NP

## 2023-08-13 NOTE — Patient Instructions (Signed)
 Medication Instructions:  Your physician recommends that you continue on your current medications as directed. Please refer to the Current Medication list given to you today.  PLEASE HOLD ASPIRIN ONE WEEK BEFORE PROCEDURE   Follow-Up: At [facility], you and your health needs are our priority.  As part of our continuing mission to provide you with exceptional heart care, we have created designated Provider Care Teams.  These Care Teams include your primary Cardiologist (physician) and Advanced Practice Providers (APPs -  Physician Assistants and Nurse Practitioners) who all work together to provide you with the care you need, when you need it.  We recommend signing up for the patient portal called "MyChart".  Sign up information is provided on this After Visit Summary.  MyChart is used to connect with patients for Virtual Visits (Telemedicine).  Patients are able to view lab/test results, encounter notes, upcoming appointments, etc.  Non-urgent messages can be sent to your provider as well.   To learn more about what you can do with MyChart, go to [email or web address].    Your next appointment:   1 YEAR WITH DR. Cristal Deer

## 2023-08-15 NOTE — Telephone Encounter (Signed)
 Patient had clearance visit done

## 2023-08-16 ENCOUNTER — Encounter (HOSPITAL_BASED_OUTPATIENT_CLINIC_OR_DEPARTMENT_OTHER): Payer: Self-pay | Admitting: Family

## 2023-08-21 ENCOUNTER — Encounter: Payer: Self-pay | Admitting: Gastroenterology

## 2023-08-22 ENCOUNTER — Telehealth: Payer: Self-pay | Admitting: Gastroenterology

## 2023-08-22 NOTE — Telephone Encounter (Signed)
 Called the pharmacy and was told that patient didn't pick the prep up and it was sent on 06-29-23 and now they have to order the prep. Called the daughter and let her know and she asked what she could do if they don't get it in time and told her that she can do a miralax prep and ill put the instructions in mychart and she can do that instead of worrying about a script and she said ok. Instructions made

## 2023-08-22 NOTE — Telephone Encounter (Signed)
 Inbound call from patients daughter stating that patient is scheduled to have a colonoscopy on 3/11 and the pharmacy does not have prescription for her prep. Requesting medication be resent. Please advise.

## 2023-08-28 ENCOUNTER — Encounter: Payer: Self-pay | Admitting: Gastroenterology

## 2023-08-28 ENCOUNTER — Ambulatory Visit: Payer: Medicare HMO | Admitting: Gastroenterology

## 2023-08-28 VITALS — BP 125/69 | HR 63 | Temp 97.9°F | Resp 13 | Ht 64.0 in | Wt 195.0 lb

## 2023-08-28 DIAGNOSIS — K449 Diaphragmatic hernia without obstruction or gangrene: Secondary | ICD-10-CM

## 2023-08-28 DIAGNOSIS — K2950 Unspecified chronic gastritis without bleeding: Secondary | ICD-10-CM

## 2023-08-28 DIAGNOSIS — K5730 Diverticulosis of large intestine without perforation or abscess without bleeding: Secondary | ICD-10-CM

## 2023-08-28 DIAGNOSIS — Q399 Congenital malformation of esophagus, unspecified: Secondary | ICD-10-CM | POA: Diagnosis not present

## 2023-08-28 DIAGNOSIS — K641 Second degree hemorrhoids: Secondary | ICD-10-CM | POA: Diagnosis not present

## 2023-08-28 DIAGNOSIS — K222 Esophageal obstruction: Secondary | ICD-10-CM

## 2023-08-28 DIAGNOSIS — K219 Gastro-esophageal reflux disease without esophagitis: Secondary | ICD-10-CM | POA: Diagnosis not present

## 2023-08-28 DIAGNOSIS — D509 Iron deficiency anemia, unspecified: Secondary | ICD-10-CM | POA: Diagnosis not present

## 2023-08-28 MED ORDER — SODIUM CHLORIDE 0.9 % IV SOLN
500.0000 mL | INTRAVENOUS | Status: DC
Start: 2023-08-28 — End: 2023-08-28

## 2023-08-28 NOTE — Progress Notes (Signed)
 Sedate, gd SR, tolerated procedure well, VSS, report to RN

## 2023-08-28 NOTE — Progress Notes (Signed)
 Chief Complaint: IDA  Referring Provider:  Audie Pinto, FNP      ASSESSMENT AND PLAN;   #1. 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  #2. GERD  Plan: -Hemoccult cards x 3  -EGD/colon -Protonix 40mg  po every day -If neg, CT AP +/- VCE -Records for EGI    I discussed EGD/Colonoscopy- the indications, risks, alternatives and potential complications including, but not limited to bleeding, infection, reaction to meds, damage to internal organs, cardiac and/or pulmonary problems, and perforation requiring surgery. The possibility that significant findings could be missed was explained. All ? were answered. Pt consents to proceed. HPI:    Janice [name] is a 78 y.o. female  With history of rheumatoid arthritis on Rinvoq, bronchiectasis, CAD with MI 2010, rheumatoid arthritis, history of prior PE (provoked after she broke her foot/casted and then placed on OCP, told clots were small), chronic back pain/osteoarthritis on Voltaren, GERD  For IDA, referred by Dr. Melvyn Neth, has been receiving IV iron on regular basis.  Per patient heme-negative stools.  Iron studies 04/2023 with saturation 7%, TIBC 521, ferritin 12 despite IV iron. Hb improved from 9.4 to 11.0.  Being closely followed by Dr. Melvyn Neth.  No nausea, vomiting, heartburn, regurgitation, odynophagia or dysphagia.  No significant diarrhea. Has occ constipation.  No melena or hematochezia. No unintentional weight loss. No abdominal pain.   The patient has a history of foot surgery and hysterectomy, and has seen a cardiologist, Dr. Janne Napoleon, as recently as October of the previous year.   Had remote history of endoscopic procedures by Dr. Ewing Schlein at [facility] records available currently.    Family history of colon cancer in maternal grandmother above age 63.  No sodas, chocolates, chewing gums, artificial sweeteners and candy. No NSAIDs except occasional Voltaren  Past Medical History:  Diagnosis Date   Acute  myocardial infarction, unspecified site, episode of care unspecified    Anemia    Arthritis, rheumatoid (HCC)    hx   Back pain, chronic    GERD (gastroesophageal reflux disease)    Pulmonary embolism (HCC)    Scoliosis    Skin cancer     Past Surgical History:  Procedure Laterality Date   bladder tack     BUNIONECTOMY Bilateral    COLONOSCOPY W/ POLYPECTOMY     HERNIA REPAIR     RECTOCELE REPAIR     TONSILLECTOMY     TUBAL LIGATION      Family History  Problem Relation Age of Onset   Lung cancer Mother    Esophageal cancer Mother    Colon cancer Maternal Grandmother     Social History   Tobacco Use   Smoking status: Former   Smokeless tobacco: Never   Tobacco comments:    has 7 plus pack-year smoking history, but quit in 1981.   Vaping Use   Vaping status: Never Used  Substance Use Topics   Alcohol use: Not Currently    Comment: rare etoh    Drug use: No    Current Outpatient Medications  Medication Sig Dispense Refill   Biotin 5000 MCG TABS Take 5,000 mcg by mouth daily.     Calcium Carb-Cholecalciferol 600-5 MG-MCG TABS 1 tablet with food Orally Once a day     Cholecalciferol (VITAMIN D3 PO) Take 1 capsule by mouth daily.     clotrimazole (MYCELEX) 10 MG troche      diclofenac (VOLTAREN) 50 MG EC tablet Take 50 mg by mouth 2 (two)  times daily.     folic acid (FOLVITE) 1 MG tablet Take 1 mg by mouth daily.     Multiple Vitamin (QUINTABS) TABS Take 1 tablet by mouth at bedtime.     Multiple Vitamins-Minerals (MULTIVITAL) tablet Take 1 tablet by mouth daily.       MYRBETRIQ 50 MG TB24 tablet Take 50 mg by mouth daily.     RINVOQ 15 MG TB24 Take by mouth.     ALPRAZolam (XANAX) 0.5 MG tablet Take 0.5 mg by mouth daily as needed for anxiety. (Patient not taking: Reported on 08/28/2023)     aspirin EC 81 MG tablet Take 81 mg by mouth daily.     budesonide-formoterol (SYMBICORT) 160-4.5 MCG/ACT inhaler Inhale 2 puffs into the lungs 2 (two) times daily. INHALE 2  PUFFS INTO THE LUNGS TWICE A DAY 10.2 each 2   estradiol (ESTRACE) 0.1 MG/GM vaginal cream SMARTSIG:1 sparingly Vaginal Every Night     famotidine (PEPCID) 20 MG tablet Take 20 mg by mouth daily.     ipratropium-albuterol (DUONEB) 0.5-2.5 (3) MG/3ML SOLN NEBULIZE CONTENTS OF 1 VIAL EVERY 6 HOURS AS NEEDED 360 mL 1   nitroGLYCERIN (NITROSTAT) 0.4 MG SL tablet PLACE 1 TABLET (0.4 MG TOTAL) UNDER THE TONGUE EVERY 5 (FIVE) MINUTES AS NEEDED FOR CHEST PAIN. 25 tablet 3   pantoprazole (PROTONIX) 40 MG tablet Take 1 tablet (40 mg total) by mouth at bedtime. 90 tablet 4   traMADol (ULTRAM) 50 MG tablet Take 50 mg by mouth 2 (two) times daily as needed.     Current Facility-Administered Medications  Medication Dose Route Frequency Provider Last Rate Last Admin   0.9 %  sodium chloride infusion  500 mL Intravenous Continuous Lynann Bologna, MD        Allergies  Allergen Reactions   Erythromycin Other (See Comments)    Upset stomach     Review of Systems:  Constitutional: Denies fever, chills, diaphoresis, appetite change and fatigue.  HEENT: none   Respiratory: Denies SOB, DOE, has cough, No chest tightness,  and wheezing.   Cardiovascular: Denies chest pain, palpitations and leg swelling.  Genitourinary: Denies dysuria, urgency, frequency, hematuria, flank pain and difficulty urinating.  Musculoskeletal: Denies myalgias, has back pain, joint swelling, arthralgias and gait problem.  Skin: No rash.  Neurological: Denies dizziness, seizures, syncope, weakness, light-headedness, numbness and headaches.  Hematological: Denies adenopathy. Easy bruising, personal or family bleeding history  Psychiatric/Behavioral: Has anxiety or depression     Physical Exam:    BP 125/72   Pulse 78   Temp 97.9 F (36.6 C)   Ht 5\' 4"  (1.626 m)   Wt 195 lb (88.5 kg)   SpO2 96%   BMI 33.47 kg/m  Wt Readings from Last 3 Encounters:  08/28/23 195 lb (88.5 kg)  08/13/23 197 lb (89.4 kg)  06/29/23 195 lb (88.5  kg)   Constitutional:  Well-developed, in no acute distress. Psychiatric: Normal mood and affect. Behavior is normal. HEENT: Pupils normal.  Conjunctivae are normal. No scleral icterus. Cardiovascular: Normal rate, regular rhythm. No edema Pulmonary/chest: Effort normal and breath sounds-decreased. no wheezing, rales or rhonchi. Abdominal: Soft, nondistended. Nontender. Bowel sounds active throughout. There are no masses palpable. No hepatomegaly. Rectal: Deferred-to be performed at the time of colonoscopy Neurological: Alert and oriented to person place and time. Skin: Skin is warm and dry. No rashes noted.  Data Reviewed: I have personally reviewed following labs and imaging studies  CBC:    Latest Ref Rng &  Units 05/14/2023   11:04 AM 02/02/2023   12:00 AM 08/07/2022   12:00 AM  CBC  WBC 4.0 - 10.5 K/uL 5.5  8.0     9.4      Hemoglobin 12.0 - 15.0 g/dL 52.8  9.4     41.3      Hematocrit 36.0 - 46.0 % 35.1  28     39      Platelets 150 - 400 K/uL 376  446     344         This result is from an external source.    CMP:    Latest Ref Rng & Units 03/02/2022   12:00 AM 11/02/2021   11:48 AM 05/16/2009   10:47 AM  CMP  Glucose 70 - 99 mg/dL  244  010   BUN 4 - 21 17     17  14    Creatinine 0.5 - 1.1 0.7     0.71  0.96   Sodium 137 - 147 137     138  145   Potassium 3.5 - 5.1 mEq/L 4.4     4.0  4.1   Chloride 99 - 108 103     102  110   CO2 13 - 22 25     25  28    Calcium 8.7 - 10.7 9.7     9.8  9.4   Total Protein 6.0 - 8.3 g/dL   6.8   Total Bilirubin 0.3 - 1.2 mg/dL   0.5   Alkaline Phos 25 - 125 34      40   AST 13 - 35 28      42   ALT 7 - 35 U/L 19      35      This result is from an external source.       Edman Circle, MD 08/28/2023, 8:55 AM  Cc: Audie Pinto, FNP '

## 2023-08-28 NOTE — Op Note (Signed)
 [facility] Patient Name: Janice [name] Procedure Date: 08/28/2023 8:21 AM MRN: [id number] Endoscopist: Lynann Bologna , MD, [id number] Age: 78 Referring MD:  Date of Birth: November 25, 1945 Gender: Female Account #: [id number] Procedure:                Upper GI endoscopy Indications:              Iron deficiency anemia, Heartburn Medicines:                Monitored Anesthesia Care Procedure:                Pre-Anesthesia Assessment:                           - Prior to the procedure, a History and Physical                            was performed, and patient medications and                            allergies were reviewed. The patient's tolerance of                            previous anesthesia was also reviewed. The risks                            and benefits of the procedure and the sedation                            options and risks were discussed with the patient.                            All questions were answered, and informed consent                            was obtained. Prior Anticoagulants: The patient has                            taken no anticoagulant or antiplatelet agents. ASA                            Grade Assessment: II - A patient with mild systemic                            disease. After reviewing the risks and benefits,                            the patient was deemed in satisfactory condition to                            undergo the procedure.                           After obtaining informed consent, the endoscope was  passed under direct vision. Throughout the                            procedure, the patient's blood pressure, pulse, and                            oxygen saturations were monitored continuously. The                            Olympus Scope [id number] was introduced through the                            mouth, and advanced to the second part of duodenum.                            The upper GI  endoscopy was accomplished without                            difficulty. The patient tolerated the procedure                            well. Scope In: Scope Out: Findings:                 The lower third of the esophagus was mildly                            tortuous and foreshortened d/t HH.                           A non-obstructing and mild Schatzki ring was found                            at the gastroesophageal junction (at 33 cm).                           A 7 cm hiatal hernia was present (from 33 to 40                            cm). No Cameron erosions.                           The entire examined stomach was normal. Biopsies                            were taken with a cold forceps for histology.                           The examined duodenum was normal. Biopsies for                            histology were taken with a cold forceps for                            evaluation  of celiac disease. Complications:            No immediate complications. Estimated Blood Loss:     Estimated blood loss: none. Impression:               - Large hiatal hernia.                           - Otherwise Nl EGD Recommendation:           - Patient has a contact number available for                            emergencies. The signs and symptoms of potential                            delayed complications were discussed with the                            patient. Return to normal activities tomorrow.                            Written discharge instructions were provided to the                            patient.                           - Resume previous diet.                           - Continue present medications.                           - Await pathology results.                           - The findings and recommendations were discussed                            with the patient's family. Lynann Bologna, MD 08/28/2023 9:29:05 AM This report has been signed electronically.

## 2023-08-28 NOTE — Patient Instructions (Signed)
 Resume previous diet and medications.  Handout provided on hiatal hernia, hemorrhoids, high fiber diet, and diverticulosis.   YOU HAD AN ENDOSCOPIC PROCEDURE TODAY AT THE [facility]:   Refer to the procedure report that was given to you for any specific questions about what was found during the examination.  If the procedure report does not answer your questions, please call your gastroenterologist to clarify.  If you requested that your care partner not be given the details of your procedure findings, then the procedure report has been included in a sealed envelope for you to review at your convenience later.  YOU SHOULD EXPECT: Some feelings of bloating in the abdomen. Passage of more gas than usual.  Walking can help get rid of the air that was put into your GI tract during the procedure and reduce the bloating. If you had a lower endoscopy (such as a colonoscopy or flexible sigmoidoscopy) you may notice spotting of blood in your stool or on the toilet paper. If you underwent a bowel prep for your procedure, you may not have a normal bowel movement for a few days.  Please Note:  You might notice some irritation and congestion in your nose or some drainage.  This is from the oxygen used during your procedure.  There is no need for concern and it should clear up in a day or so.  SYMPTOMS TO REPORT IMMEDIATELY:  Following lower endoscopy (colonoscopy or flexible sigmoidoscopy):  Excessive amounts of blood in the stool  Significant tenderness or worsening of abdominal pains  Swelling of the abdomen that is new, acute  Fever of 100F or higher  Following upper endoscopy (EGD)  Vomiting of blood or coffee ground material  New chest pain or pain under the shoulder blades  Painful or persistently difficult swallowing  New shortness of breath  Fever of 100F or higher  Black, tarry-looking stools  For urgent or emergent issues, a gastroenterologist can be reached at any hour by  calling [phone]. Do not use MyChart messaging for urgent concerns.    DIET:  We do recommend a small meal at first, but then you may proceed to your regular diet.  Drink plenty of fluids but you should avoid alcoholic beverages for 24 hours.  ACTIVITY:  You should plan to take it easy for the rest of today and you should NOT DRIVE or use heavy machinery until tomorrow (because of the sedation medicines used during the test).    FOLLOW UP: Our staff will call the number listed on your records the next business day following your procedure.  We will call around 7:15- 8:00 am to check on you and address any questions or concerns that you may have regarding the information given to you following your procedure. If we do not reach you, we will leave a message.     If any biopsies were taken you will be contacted by phone or by letter within the next 1-3 weeks.  Please call us at [phone] if you have not heard about the biopsies in 3 weeks.    SIGNATURES/CONFIDENTIALITY: You and/or your care partner have signed paperwork which will be entered into your electronic medical record.  These signatures attest to the fact that that the information above on your After Visit Summary has been reviewed and is understood.  Full responsibility of the confidentiality of this discharge information lies with you and/or your care-partner.

## 2023-08-28 NOTE — Op Note (Signed)
 [facility] Patient Name: Janice [name] Procedure Date: 08/28/2023 8:21 AM MRN: [id number] Endoscopist: Lynann Bologna , MD, [id number] Age: 78 Referring MD:  Date of Birth: 05/19/1946 Gender: Female Account #: [id number] Procedure:                Colonoscopy Indications:              Iron deficiency anemia Medicines:                Monitored Anesthesia Care Procedure:                Pre-Anesthesia Assessment:                           - Prior to the procedure, a History and Physical                            was performed, and patient medications and                            allergies were reviewed. The patient's tolerance of                            previous anesthesia was also reviewed. The risks                            and benefits of the procedure and the sedation                            options and risks were discussed with the patient.                            All questions were answered, and informed consent                            was obtained. Prior Anticoagulants: The patient has                            taken no anticoagulant or antiplatelet agents. ASA                            Grade Assessment: II - A patient with mild systemic                            disease. After reviewing the risks and benefits,                            the patient was deemed in satisfactory condition to                            undergo the procedure.                           After obtaining informed consent, the colonoscope  was passed under direct vision. Throughout the                            procedure, the patient's blood pressure, pulse, and                            oxygen saturations were monitored continuously. The                            Olympus Scope SN: [id number] was introduced through                            the anus and advanced to the 2 cm into the ileum.                            The colonoscopy was performed  without difficulty.                            The patient tolerated the procedure well. The                            quality of the bowel preparation was good. The                            terminal ileum, ileocecal valve, appendiceal                            orifice, and rectum were photographed. Scope In: 9:06:07 AM Scope Out: 9:23:03 AM Scope Withdrawal Time: 0 hours 10 minutes 22 seconds  Total Procedure Duration: 0 hours 16 minutes 56 seconds  Findings:                 Multiple medium-mouthed diverticula were found in                            the sigmoid colon, descending colon, transverse                            colon, ascending colon and cecum.                           Non-bleeding internal hemorrhoids were found during                            retroflexion and during perianal exam. The                            hemorrhoids were moderate and Grade II (internal                            hemorrhoids that prolapse but reduce spontaneously).                           The terminal ileum appeared normal.  The exam was otherwise without abnormality on                            direct and retroflexion views. Complications:            No immediate complications. Estimated Blood Loss:     Estimated blood loss: none. Impression:               - Diverticulosis in the sigmoid colon, in the                            descending colon, in the transverse colon, in the                            ascending colon and in the cecum.                           - Non-bleeding internal hemorrhoids.                           - The examined portion of the ileum was normal.                           - The examination was otherwise normal on direct                            and retroflexion views.                           - No specimens collected. Recommendation:           - Patient has a contact number available for                            emergencies. The  signs and symptoms of potential                            delayed complications were discussed with the                            patient. Return to normal activities tomorrow.                            Written discharge instructions were provided to the                            patient.                           - High fiber diet.                           - Continue present medications.                           - Repeat colonoscopy is not recommended for  screening purposes.                           - The findings and recommendations were discussed                            with the patient's family. Lynann Bologna, MD 08/28/2023 9:31:13 AM This report has been signed electronically.

## 2023-08-28 NOTE — Progress Notes (Signed)
 Called to room to assist during endoscopic procedure.  Patient ID and intended procedure confirmed with present staff. Received instructions for my participation in the procedure from the performing physician.

## 2023-08-29 ENCOUNTER — Telehealth: Payer: Self-pay | Admitting: *Deleted

## 2023-08-29 NOTE — Telephone Encounter (Signed)
 No answer on  follow up call. Left message.

## 2023-08-30 ENCOUNTER — Encounter: Payer: Self-pay | Admitting: Gastroenterology

## 2023-08-30 LAB — SURGICAL PATHOLOGY

## 2023-09-10 NOTE — Progress Notes (Unsigned)
 [facility]  [address] [place],  [place]  [address] [phone]  Clinic Day:  05/14/2023  Referring physician: Audie Pinto, FNP   HISTORY OF PRESENT ILLNESS:  The patient is a 78 y.o. female with iron deficiency anemia.  She comes in today to reassess her iron and hemoglobin levels after receiving IV iron again in December 2024.   The patient claims she has noticed also getting weaker over the past 2 weeks.  However, she denies having any overt forms of blood loss.    The patient is scheduled to see GI in January 2025 to be evaluated with both an EGD and colonoscopy.   PHYSICAL EXAM:  There were no vitals taken for this visit. Wt Readings from Last 3 Encounters:  08/28/23 195 lb (88.5 kg)  08/13/23 197 lb (89.4 kg)  06/29/23 195 lb (88.5 kg)   There is no height or weight on file to calculate BMI. Performance status (ECOG): 1 - Symptomatic but completely ambulatory Physical Exam Constitutional:      Appearance: Normal appearance. She is not ill-appearing.  HENT:     Mouth/Throat:     Mouth: Mucous membranes are moist.     Pharynx: Oropharynx is clear. No oropharyngeal exudate or posterior oropharyngeal erythema.  Cardiovascular:     Rate and Rhythm: Normal rate and regular rhythm.     Heart sounds: No murmur heard.    No friction rub. No gallop.  Pulmonary:     Effort: Pulmonary effort is normal. No respiratory distress.     Breath sounds: Normal breath sounds. No wheezing, rhonchi or rales.  Abdominal:     General: Bowel sounds are normal. There is no distension.     Palpations: Abdomen is soft. There is no mass.     Tenderness: There is no abdominal tenderness.  Musculoskeletal:        General: No swelling.     Right lower leg: No edema.     Left lower leg: No edema.  Lymphadenopathy:     Cervical: No cervical adenopathy.     Upper Body:     Right upper body: No supraclavicular or axillary adenopathy.     Left upper body:  No supraclavicular or axillary adenopathy.     Lower Body: No right inguinal adenopathy. No left inguinal adenopathy.  Skin:    General: Skin is warm.     Coloration: Skin is not jaundiced.     Findings: No lesion or rash.  Neurological:     General: No focal deficit present.     Mental Status: She is alert and oriented to person, place, and time. Mental status is at baseline.  Psychiatric:        Mood and Affect: Mood normal.        Behavior: Behavior normal.        Thought Content: Thought content normal.   LABS:      Latest Ref Rng & Units 05/14/2023   11:04 AM 02/02/2023   12:00 AM 08/07/2022   12:00 AM  CBC  WBC 4.0 - 10.5 K/uL 5.5  8.0     9.4      Hemoglobin 12.0 - 15.0 g/dL 29.5  9.4     62.1      Hematocrit 36.0 - 46.0 % 35.1  28     39      Platelets 150 - 400 K/uL 376  446     344  This result is from an external source.      Latest Ref Rng & Units 03/02/2022   12:00 AM 11/02/2021   11:48 AM 05/16/2009   10:47 AM  CMP  Glucose 70 - 99 mg/dL  161  096   BUN 4 - 21 17     17  14    Creatinine 0.5 - 1.1 0.7     0.71  0.96   Sodium 137 - 147 137     138  145   Potassium 3.5 - 5.1 mEq/L 4.4     4.0  4.1   Chloride 99 - 108 103     102  110   CO2 13 - 22 25     25  28    Calcium 8.7 - 10.7 9.7     9.8  9.4   Total Protein 6.0 - 8.3 g/dL   6.8   Total Bilirubin 0.3 - 1.2 mg/dL   0.5   Alkaline Phos 25 - 125 34      40   AST 13 - 35 28      42   ALT 7 - 35 U/L 19      35      This result is from an external source.    Latest Reference Range & Units 05/14/23 11:04  Iron 28 - 170 ug/dL 36  UIBC ug/dL 045  TIBC 409 - 811 ug/dL 914 (H)  Saturation Ratios 10.4 - 31.8 % 7 (L)  Ferritin 11 - 307 ng/mL 12  Folate >5.9 ng/mL >40.0  (H): Data is abnormally high (L): Data is abnormally low  ASSESSMENT & PLAN:  Assessment/Plan:  A 78 y.o. female with iron deficiency  anemia.  Although her hemoglobin is better, it remains suboptimal.  Furthermore, her iron parameters are still fairly low despite receiving IV iron in September 2024.  Based upon this, I will arrange for her to receive another course of IV iron over these next few weeks to rapidly replenish her iron stores and improve her hemoglobin.  She knows to keep her GI appointment in January 2025, especially if she remains iron deficient.  I will see her back in 4 months for repeat clinical assessment.  The patient understands all the plans discussed today and is in agreement with them.    Davine Coba Kirby Funk, MD

## 2023-09-11 ENCOUNTER — Inpatient Hospital Stay: Payer: Medicare HMO

## 2023-09-11 ENCOUNTER — Other Ambulatory Visit: Payer: Self-pay | Admitting: Oncology

## 2023-09-11 ENCOUNTER — Inpatient Hospital Stay: Payer: Medicare HMO | Attending: Oncology | Admitting: Oncology

## 2023-09-11 ENCOUNTER — Other Ambulatory Visit: Payer: Self-pay

## 2023-09-11 ENCOUNTER — Telehealth: Payer: Self-pay | Admitting: Oncology

## 2023-09-11 VITALS — BP 150/81 | HR 67 | Temp 98.1°F | Resp 16 | Ht 64.0 in | Wt 191.9 lb

## 2023-09-11 DIAGNOSIS — D508 Other iron deficiency anemias: Secondary | ICD-10-CM

## 2023-09-11 DIAGNOSIS — D509 Iron deficiency anemia, unspecified: Secondary | ICD-10-CM | POA: Insufficient documentation

## 2023-09-11 LAB — CBC WITH DIFFERENTIAL (CANCER CENTER ONLY)
Abs Immature Granulocytes: 0.05 10*3/uL (ref 0.00–0.07)
Basophils Absolute: 0 10*3/uL (ref 0.0–0.1)
Basophils Relative: 0 %
Eosinophils Absolute: 0.5 10*3/uL (ref 0.0–0.5)
Eosinophils Relative: 6 %
HCT: 39.7 % (ref 36.0–46.0)
Hemoglobin: 12.4 g/dL (ref 12.0–15.0)
Immature Granulocytes: 1 %
Lymphocytes Relative: 14 %
Lymphs Abs: 1.2 10*3/uL (ref 0.7–4.0)
MCH: 30.2 pg (ref 26.0–34.0)
MCHC: 31.2 g/dL (ref 30.0–36.0)
MCV: 96.8 fL (ref 80.0–100.0)
Monocytes Absolute: 0.8 10*3/uL (ref 0.1–1.0)
Monocytes Relative: 9 %
Neutro Abs: 5.9 10*3/uL (ref 1.7–7.7)
Neutrophils Relative %: 70 %
Platelet Count: 316 10*3/uL (ref 150–400)
RBC: 4.1 MIL/uL (ref 3.87–5.11)
RDW: 14.8 % (ref 11.5–15.5)
WBC Count: 8.6 10*3/uL (ref 4.0–10.5)
nRBC: 0 % (ref 0.0–0.2)
nRBC: 0 /100{WBCs}

## 2023-09-11 LAB — FERRITIN: Ferritin: 18 ng/mL (ref 11–307)

## 2023-09-11 LAB — IRON AND TIBC
Iron: 61 ug/dL (ref 28–170)
Saturation Ratios: 13 % (ref 10.4–31.8)
TIBC: 466 ug/dL — ABNORMAL HIGH (ref 250–450)
UIBC: 405 ug/dL

## 2023-09-11 LAB — FOLATE: Folate: >40 ng/mL (ref >5.9)

## 2023-09-11 NOTE — Telephone Encounter (Signed)
 09/11/23 Spoke with patient and confirmed next appts.

## 2023-09-12 ENCOUNTER — Telehealth: Payer: Self-pay

## 2023-09-12 NOTE — Telephone Encounter (Signed)
  Latest Reference Range & Units 05/14/23 11:04 09/11/23 10:09   Iron 28 - 170 ug/dL 36 61  UIBC ug/dL 161 096  TIBC 045 - 409 ug/dL 811 (H) 914 (H)  Saturation Ratios 10.4 - 31.8 % 7 (L) 13  Ferritin 11 - 307 ng/mL 12 18  (H): Data is abnormally high (L): Data is abnormally low   ASSESSMENT & PLAN:  Assessment/Plan:  A 78 y.o. female with iron deficiency anemia.  I am pleased as her hemoglobin has normalized.  Furthermore, her iron parameters are improved.  As mentioned previously, her recent EGD and colonoscopy showed no ominous GI tract pathology behind her iron deficiency anemia.  As her iron and hemoglobin are better, she will be followed conservatively over these next few months.  I will see her back in 4 months for repeat clinical assessment.  The patient understands all the plans discussed today and is in agreement with them.     Dequincy Kirby Funk, MD

## 2023-09-17 DIAGNOSIS — R350 Frequency of micturition: Secondary | ICD-10-CM | POA: Diagnosis not present

## 2023-09-17 DIAGNOSIS — N3001 Acute cystitis with hematuria: Secondary | ICD-10-CM | POA: Diagnosis not present

## 2023-11-01 ENCOUNTER — Ambulatory Visit (HOSPITAL_COMMUNITY)
Admission: RE | Admit: 2023-11-01 | Discharge: 2023-11-01 | Disposition: A | Discharge status: Home / Self Care | Source: Ambulatory Visit | Attending: Pulmonary Disease | Admitting: Pulmonary Disease

## 2023-11-01 DIAGNOSIS — J984 Other disorders of lung: Secondary | ICD-10-CM | POA: Diagnosis not present

## 2023-11-01 DIAGNOSIS — J479 Bronchiectasis, uncomplicated: Secondary | ICD-10-CM | POA: Diagnosis not present

## 2023-11-05 ENCOUNTER — Encounter: Payer: Self-pay | Admitting: Pulmonary Disease

## 2023-11-06 ENCOUNTER — Telehealth: Payer: Self-pay

## 2023-11-06 NOTE — Telephone Encounter (Signed)
 Pt schedule for 06/18 NFN

## 2023-11-09 DIAGNOSIS — R300 Dysuria: Secondary | ICD-10-CM | POA: Diagnosis not present

## 2023-12-05 ENCOUNTER — Ambulatory Visit: Admitting: Pulmonary Disease

## 2023-12-05 ENCOUNTER — Encounter: Payer: Self-pay | Admitting: Pulmonary Disease

## 2023-12-05 VITALS — BP 114/82 | HR 87 | Ht 64.0 in | Wt 192.0 lb

## 2023-12-05 DIAGNOSIS — J479 Bronchiectasis, uncomplicated: Secondary | ICD-10-CM | POA: Diagnosis not present

## 2023-12-05 DIAGNOSIS — R0982 Postnasal drip: Secondary | ICD-10-CM

## 2023-12-05 DIAGNOSIS — R911 Solitary pulmonary nodule: Secondary | ICD-10-CM | POA: Diagnosis not present

## 2023-12-05 DIAGNOSIS — Z87891 Personal history of nicotine dependence: Secondary | ICD-10-CM

## 2023-12-05 MED ORDER — DOXYCYCLINE HYCLATE 100 MG PO TABS: 100.0000 mg | ORAL_TABLET | Freq: Two times a day (BID) | ORAL | 0 refills | Status: AC

## 2023-12-05 MED ORDER — PREDNISONE 10 MG PO TABS: 40.0000 mg | ORAL_TABLET | Freq: Every day | ORAL | 0 refills | Status: DC

## 2023-12-05 MED ORDER — FLUTICASONE PROPIONATE 50 MCG/ACT NA SUSP
1.0000 | Freq: Every day | NASAL | 2 refills | Status: AC
Start: 2023-12-05 — End: ?

## 2023-12-05 MED ORDER — BUDESONIDE-FORMOTEROL FUMARATE 160-4.5 MCG/ACT IN AERO: 2.0000 | INHALATION_SPRAY | Freq: Two times a day (BID) | RESPIRATORY_TRACT | 11 refills | Status: AC

## 2023-12-05 NOTE — Progress Notes (Signed)
 Synopsis: Referred in May 2023 for Acute visit  Subjective:   PATIENT ID: Janice [name] GENDER: female DOB: 1946/02/07, MRN: [id number]  HPI  Chief Complaint  Patient presents with   Follow-up    Ptt states past week sinus issues    Janice [name] is a 78 year old woman, former smoker with rheumatoid arthritis on Rinvoq and bronchiectasis who returns to pulmonary clinic for follow up visit.   She experiences sinus congestion and a persistent cough for the past two to three weeks, attributed to post-nasal drip from sinus drainage. These symptoms affect her sleep quality, keeping her awake at night. She denies pain, pressure, wheezing, or shortness of breath.  Reviewed CT chest results from 11/01/23. Bronchiectasis, no findings of UIP. New 4mm nodule in lingula and stable 7mm nodule in left lower lobe.  Despite a large hiatal hernia, she experiences no heartburn or reflux.  She is on Rinvoq for rheumatoid arthritis and has not been using her Symbicort  inhaler or DuoNeb nebulizer treatments recently, although they are available at home.   She maintains a good level of physical activity and does not get easily fatigued during activities such as going to the store.  OV 11/09/22 She was last seen 04/21/22 by Janice Ear, NP for pre-operative evaluation. No acute issues at that time.  She is feeling well today, no cough or mucous production. No dyspnea or wheezing. She is on Rinvoq 15mg  daily for her RA. She is off humira and enbrel.    OV 02/22/22 She completed steroid taper on 8/15 for pleural effusion possibly related to RA flare.   She reports increasing dyspnea over the past month since stopping her steroid taper. She also reports her rheumatoid arthritis is flaring at this time with diffuse joint pains of the shoulders, wrists and ankles. She was seen by rheumatoogy 2 weeks ago where she reports blood work showed iron  deficiency and is working with her primary care on starting  iron  supplementation.   She remains on enbrel for her RA. She is not on methotrexate anymore.   Along with the progressive dyspnea, she reports increase in wheezing. She also reports having chills last Wednesday night and Thursday morning. She is coughing up more mucous. She has run out of duoneb solution and is waiting on her mail order from the pharmacy. She has stopped using anoro due to cough.   She had labs done 2 weeks ago at [facility].   She does report an aspiration episode recently.  OV 12/30/21 She was tapered down from 40mg  daily at last visit to 20mg  daily. She has been on bactrim  3 times weekly. She was started on duoneb treatments and flutter valve therapy for her bronchiectasis.   She reports her right sided pain has resolved and she denies cough, sputum production, fevers or night sweats..  She is followed by Janice [name] of Rheumatology. She has resumed enbrel and denies any joint aches at this time.   She had MBS performed at [facility] with evidence of mild oropharyngeal dysphagia.   OV 11/21/21 She was scheduled for thoracentesis 11/04/21 but based on pleural US , the effusion was very small and complicated with loculations and septations. Dr. Jaquita Merl viewed the pleural space as well and deemed it too risky for procedure so no thoracentesis was performed.  She was started on 40mg  of prednisone  for concern of rheuamtoid associated pleural effusion given she had been off her rheumatoid arthritis treatment. She was also treated with  augmentin  for concern of possible parapneumonic effusion in setting of aspiration. MBS was performed at [facility].   Labs 5/17 showed hemoglobin 10.8g/dL, peripheral eosinophil count of 400, ANA 1:320 speckled pattern, CCP >250 and RF 354.   She reports resolution of the right sided chest pain and night sweats since completing 14 day course of augmentin  and starting 40mg  of prednisone  daily. She has resumed her  methotrexate and enbrel for her RA. Her joint pains are much better.   She now has increased cough over the past 4-5 days and shortness of breath.  She remains on symbicort  160-4.18mcg 2 puffs twice daily.   Initial OV 11/02/21 She has progressive dyspnea over the past [redacted] weeks along with occasional night sweats that require her to change her clothes/sheets. She denies fevers and chills. She complained of right sided flank pain and anterior chest pain that was worse with cough and deep inspiration. She had CT abdominal scan at [facility] that noted basilar infiltrates, bronchiectasis and loculated right pleural effusion. She was started on doxycycline  which has helped improve her symptoms somewhat. She most recently had night sweats last night. She reports increase symptoms with trouble swallowing solids over the past month.   She has history of rheumatoid arthritis which tends to affect her hands mostly.   She has been on symbiocrt 160-4.5mcg for her breathing.   She is accompanied by her daughter.    Past Medical History:  Diagnosis Date   Acute myocardial infarction, unspecified site, episode of care unspecified    Anemia    Arthritis, rheumatoid (HCC)    hx   Back pain, chronic    GERD (gastroesophageal reflux disease)    Pulmonary embolism (HCC)    Scoliosis    Skin cancer      Family History  Problem Relation Age of Onset   Lung cancer Mother    Esophageal cancer Mother    Colon cancer Maternal Grandmother      Social History   Socioeconomic History   Marital status: Married    Spouse name: Janice [name]   Number of children: 4   Years of education: 12   Highest education level: Not on file  Occupational History   Occupation: RETIRED - [employer]   Occupation: retired  Tobacco Use   Smoking status: Former   Smokeless tobacco: Never   Tobacco comments:    has 7 plus pack-year smoking history, but quit in 1981.   Vaping Use   Vaping status: Never Used  Substance and  Sexual Activity   Alcohol use: Not Currently    Comment: rare etoh    Drug use: No   Sexual activity: Not Currently  Other Topics Concern   Not on file  Social History Narrative   Lives in [place] with her daughter. She works full time in a [employer], doing nonexertional work. No herbal medications, regular diet. No regular exercise.    Social Drivers of Corporate investment banker Strain: Not on file  Food Insecurity: Unknown (09/17/2023)   Received from [facility]   Hunger Vital Sign    Within the past 12 months, you worried that your food would run out before you got money to buy more: Patient declined to answer    Within the past 12 months, the food you bought just didn't last and you didn't have money to get more. : Patient declined to answer  Transportation Needs: Not on file (09/17/2023)  Physical Activity: Not on file  Stress: Not on file  Social Connections: Not on file  Intimate Partner Violence: Not on file     Allergies  Allergen Reactions   Erythromycin Other (See Comments)    Upset stomach      Outpatient Medications Prior to Visit  Medication Sig Dispense Refill   aspirin EC 81 MG tablet Take 81 mg by mouth daily.     Biotin 5000 MCG TABS Take 5,000 mcg by mouth daily.     Calcium Carb-Cholecalciferol 600-5 MG-MCG TABS 1 tablet with food Orally Once a day     Cholecalciferol (VITAMIN D3 PO) Take 1 capsule by mouth daily.     clotrimazole (MYCELEX) 10 MG troche      estradiol (ESTRACE) 0.1 MG/GM vaginal cream SMARTSIG:1 sparingly Vaginal Every Night     famotidine (PEPCID) 20 MG tablet Take 20 mg by mouth daily.     folic acid  (FOLVITE ) 1 MG tablet Take 1 mg by mouth daily.     ipratropium-albuterol  (DUONEB) 0.5-2.5 (3) MG/3ML SOLN NEBULIZE CONTENTS OF 1 VIAL EVERY 6 HOURS AS NEEDED 360 mL 1   Multiple Vitamin (QUINTABS) TABS Take 1 tablet by mouth at bedtime.     MYRBETRIQ 50 MG TB24 tablet Take 50 mg by mouth daily.     nitroGLYCERIN   (NITROSTAT ) 0.4 MG SL tablet PLACE 1 TABLET (0.4 MG TOTAL) UNDER THE TONGUE EVERY 5 (FIVE) MINUTES AS NEEDED FOR CHEST PAIN. 25 tablet 3   pantoprazole  (PROTONIX ) 40 MG tablet Take 1 tablet (40 mg total) by mouth at bedtime. 90 tablet 4   RINVOQ 15 MG TB24 Take by mouth.     traMADol (ULTRAM) 50 MG tablet Take 50 mg by mouth 2 (two) times daily as needed.     budesonide -formoterol  (SYMBICORT ) 160-4.5 MCG/ACT inhaler Inhale 2 puffs into the lungs 2 (two) times daily. INHALE 2 PUFFS INTO THE LUNGS TWICE A DAY 10.2 each 2   No facility-administered medications prior to visit.   Review of Systems  Constitutional:  Negative for chills, diaphoresis, fever, malaise/fatigue and weight loss.  HENT:  Positive for congestion. Negative for sinus pain and sore throat.   Eyes: Negative.   Respiratory:  Positive for cough. Negative for hemoptysis, sputum production, shortness of breath and wheezing.   Cardiovascular:  Negative for chest pain, palpitations, orthopnea, claudication and leg swelling.  Gastrointestinal:  Negative for abdominal pain, heartburn, nausea and vomiting.  Genitourinary: Negative.   Musculoskeletal:  Negative for joint pain and myalgias.  Skin:  Negative for rash.  Neurological:  Negative for weakness.  Endo/Heme/Allergies: Negative.   Psychiatric/Behavioral: Negative.      Objective:   Vitals:   12/05/23 1020  BP: 114/82  Pulse: 87  SpO2: 94%  Weight: 192 lb (87.1 kg)  Height: 5' 4 (1.626 m)    Physical Exam Constitutional:      General: She is not in acute distress.    Appearance: She is not ill-appearing.  HENT:     Head: Normocephalic and atraumatic.   Cardiovascular:     Rate and Rhythm: Normal rate and regular rhythm.     Pulses: Normal pulses.     Heart sounds: Normal heart sounds. No murmur heard. Pulmonary:     Effort: Pulmonary effort is normal.     Breath sounds: No wheezing, rhonchi or rales.   Musculoskeletal:     Right lower leg: No edema.      Left lower leg: No edema.   Neurological:     Mental Status:  She is alert.    CBC    Component Value Date/Time   WBC 8.6 09/11/2023 1010   WBC 6.1 11/02/2021 1148   RBC 4.10 09/11/2023 1010   HGB 12.4 09/11/2023 1010   HCT 39.7 09/11/2023 1010   PLT 316 09/11/2023 1010   MCV 96.8 09/11/2023 1010   MCH 30.2 09/11/2023 1010   MCHC 31.2 09/11/2023 1010   RDW 14.8 09/11/2023 1010   LYMPHSABS 1.2 09/11/2023 1010   MONOABS 0.8 09/11/2023 1010   EOSABS 0.5 09/11/2023 1010   BASOSABS 0.0 09/11/2023 1010      Latest Ref Rng & Units 03/02/2022   12:00 AM 11/02/2021   11:48 AM 05/16/2009   10:47 AM  BMP  Glucose 70 - 99 mg/dL  409  811   BUN 4 - 21 17     17  14    Creatinine 0.5 - 1.1 0.7     0.71  0.96   Sodium 137 - 147 137     138  145   Potassium 3.5 - 5.1 mEq/L 4.4     4.0  4.1   Chloride 99 - 108 103     102  110   CO2 13 - 22 25     25  28    Calcium 8.7 - 10.7 9.7     9.8  9.4      This result is from an external source.   Chest imaging: CT Chest 11/01/23 1. Bilateral lower lobe bronchiectasis and coarsened ground-glass, left greater than right, findings likely to be postinfectious/postinflammatory in etiology. Findings are suggestive of an alternative diagnosis (not UIP) per consensus guidelines: Diagnosis of Idiopathic Pulmonary Fibrosis: An Official ATS/ERS/JRS/ALAT Clinical Practice Guideline. Am Annie Barton Crit Care Med Vol 198, Iss 5, [phone], Feb 17 2017. 2. 4 mm ill-defined nodule in the lingula, not well seen on the prior exam. If patient is high risk for bronchogenic carcinoma, a 1 year follow-up CT chest without contrast could be performed. 3. Moderate to large hiatal hernia. 4. Aortic atherosclerosis (ICD10-I70.0). Coronary artery calcification. 5. Enlarged pulmonic trunk, indicative of pulmonary arterial hypertension.  CXR 02/22/22 Small hiatal hernia.   Bronchitic changes with new atelectasis versus infiltrate LEFT lower lobe.   Vague nodular foci  in the mid upper lungs bilaterally; CT chest recommended to exclude developing pulmonary nodules.  PFT:    Latest Ref Rng & Units 08/06/2023    9:56 AM  PFT Results  FVC-Pre L 2.33   FVC-Predicted Pre % 84   FVC-Post L 2.47   FVC-Predicted Post % 88   Pre FEV1/FVC % % 74   Post FEV1/FCV % % 71   FEV1-Pre L 1.73   FEV1-Predicted Pre % 83   FEV1-Post L 1.75   DLCO uncorrected ml/min/mmHg 17.68   DLCO UNC% % 92   DLCO corrected ml/min/mmHg 19.26   DLCO COR %Predicted % 101   DLVA Predicted % 124   TLC L 4.08   TLC % Predicted % 80   RV % Predicted % 79     Labs:  Path:  Echo:  Heart Catheterization:  Assessment & Plan:   Bronchiectasis without complication (HCC) - Plan: budesonide -formoterol  (SYMBICORT ) 160-4.5 MCG/ACT inhaler, CT Chest Wo Contrast, predniSONE  (DELTASONE ) 10 MG tablet, doxycycline  (VIBRA -TABS) 100 MG tablet, Pulse oximetry, overnight, CANCELED: Pulse oximetry, overnight  Post-nasal drip - Plan: fluticasone (FLONASE) 50 MCG/ACT nasal spray, predniSONE  (DELTASONE ) 10 MG tablet, doxycycline  (VIBRA -TABS) 100 MG tablet  Lung nodule - Plan: CT Chest  Wo Contrast  Discussion: Janice [name] is a 78 year old woman, former smoker with rheumatoid arthritis on Rinvoq and bronchiectasis who returns to pulmonary clinic for follow up visit.   Bronchiectasis Chronic bronchiectasis with mild restrictive lung disease. Normal diffusion capacity reduces likelihood of pulmonary hypertension. - Restart Symbicort  inhaler, two puffs twice daily. - Use DuoNeb nebulizer twice daily if difficulty coughing up mucus. - Use flutter valve after nebulizer treatments.  Pulmonary nodule Two nodules identified, low suspicion for malignancy due to stability and bronchiectasis. Likely mucus collection. - Order follow-up CT scan in May 2026.  Pulmonary arterial hypertension Enlarged pulmonic trunk but normal diffusion capacity and previous tests reduce likelihood of pulmonary  hypertension. - Order overnight oxygen test.  Sinusitis Sinusitis with post-nasal drip causing cough and sleep disturbance. Dry mouth and nose reported. - Prescribe prednisone  40 mg daily for 5 days. - Prescribe doxycycline  for 7 days. - Prescribe Flonase nasal spray.  Allergic rhinitis Contributing to sinus congestion and post-nasal drip. - Prescribe Flonase nasal spray.  Hiatal hernia Large hiatal hernia potentially reducing lung function by occupying chest space.  Follow up in 1 year, call sooner if needed  Duaine German, MD [facility] Office: [phone]     Current Outpatient Medications:    doxycycline  (VIBRA -TABS) 100 MG tablet, Take 1 tablet (100 mg total) by mouth 2 (two) times daily., Disp: 14 tablet, Rfl: 0   fluticasone (FLONASE) 50 MCG/ACT nasal spray, Place 1 spray into both nostrils daily., Disp: 16 g, Rfl: 2   predniSONE  (DELTASONE ) 10 MG tablet, Take 4 tablets (40 mg total) by mouth daily with breakfast., Disp: 20 tablet, Rfl: 0   aspirin EC 81 MG tablet, Take 81 mg by mouth daily., Disp: , Rfl:    Biotin 5000 MCG TABS, Take 5,000 mcg by mouth daily., Disp: , Rfl:    budesonide -formoterol  (SYMBICORT ) 160-4.5 MCG/ACT inhaler, Inhale 2 puffs into the lungs 2 (two) times daily. INHALE 2 PUFFS INTO THE LUNGS TWICE A DAY, Disp: 10.2 each, Rfl: 11   Calcium Carb-Cholecalciferol 600-5 MG-MCG TABS, 1 tablet with food Orally Once a day, Disp: , Rfl:    Cholecalciferol (VITAMIN D3 PO), Take 1 capsule by mouth daily., Disp: , Rfl:    clotrimazole (MYCELEX) 10 MG troche, , Disp: , Rfl:    estradiol (ESTRACE) 0.1 MG/GM vaginal cream, SMARTSIG:1 sparingly Vaginal Every Night, Disp: , Rfl:    famotidine (PEPCID) 20 MG tablet, Take 20 mg by mouth daily., Disp: , Rfl:    folic acid  (FOLVITE ) 1 MG tablet, Take 1 mg by mouth daily., Disp: , Rfl:    ipratropium-albuterol  (DUONEB) 0.5-2.5 (3) MG/3ML SOLN, NEBULIZE CONTENTS OF 1 VIAL EVERY 6 HOURS AS NEEDED,  Disp: 360 mL, Rfl: 1   Multiple Vitamin (QUINTABS) TABS, Take 1 tablet by mouth at bedtime., Disp: , Rfl:    MYRBETRIQ 50 MG TB24 tablet, Take 50 mg by mouth daily., Disp: , Rfl:    nitroGLYCERIN  (NITROSTAT ) 0.4 MG SL tablet, PLACE 1 TABLET (0.4 MG TOTAL) UNDER THE TONGUE EVERY 5 (FIVE) MINUTES AS NEEDED FOR CHEST PAIN., Disp: 25 tablet, Rfl: 3   pantoprazole  (PROTONIX ) 40 MG tablet, Take 1 tablet (40 mg total) by mouth at bedtime., Disp: 90 tablet, Rfl: 4   RINVOQ 15 MG TB24, Take by mouth., Disp: , Rfl:    traMADol (ULTRAM) 50 MG tablet, Take 50 mg by mouth 2 (two) times daily as needed., Disp: , Rfl:

## 2023-12-05 NOTE — Patient Instructions (Addendum)
 Resume symbicort  inhaler 2 puffs twice daily - rinse mouth out after each use  Use duoneb nuebulizer treatments twice daily followed by flutter valve for airway clearance  Start prednisone  for nasal/sinus congestion, 40mg  daily for 5 days  Start doxycyline antibiotic for 7 days  Start fluticasone nasal spray, 1 spray per nostril daily  Follow up in 1 year

## 2023-12-19 DIAGNOSIS — R0902 Hypoxemia: Secondary | ICD-10-CM | POA: Diagnosis not present

## 2023-12-19 DIAGNOSIS — G4730 Sleep apnea, unspecified: Secondary | ICD-10-CM | POA: Diagnosis not present

## 2023-12-27 ENCOUNTER — Telehealth: Payer: Self-pay | Admitting: Pulmonary Disease

## 2023-12-27 DIAGNOSIS — I2721 Secondary pulmonary arterial hypertension: Secondary | ICD-10-CM

## 2023-12-27 NOTE — Telephone Encounter (Signed)
 Tried to call PT no answer and no way to leave message for return call VM full -

## 2023-12-27 NOTE — Telephone Encounter (Signed)
 ONO reviewed. She spent 7 minutes 36 seconds with oxygen saturation less than 88%. She does qualify for nocturnal oxygen, recommend she use 2L with humidification. Please place home O2 orders if she is ok with that.  JD

## 2023-12-28 NOTE — Telephone Encounter (Signed)
 Okay per patient to order O2    NFN

## 2023-12-28 NOTE — Addendum Note (Signed)
 Addended by: ARMAND SOR R on: 12/28/2023 10:56 AM   Modules accepted: Orders

## 2024-01-11 ENCOUNTER — Ambulatory Visit: Admitting: Oncology

## 2024-01-11 ENCOUNTER — Other Ambulatory Visit

## 2024-01-17 ENCOUNTER — Encounter: Payer: Self-pay | Admitting: Pulmonary Disease

## 2024-01-23 NOTE — Progress Notes (Signed)
 [facility] at [facility] [address] [place],  [place]  [address] [phone]  Clinic Day:  01/24/2024  Referring physician: [name], FNP   HISTORY OF PRESENT ILLNESS:  The patient is a 78 y.o. female with iron  deficiency anemia.  She comes in today to reassess her iron  and hemoglobin levels.   She last received IV iron  again in December 2024.   Since her last visit, the patient has been doing fairly well.  She denies having any overt forms of blood loss.  Of note, she had both an EGD and colonoscopy in March 2025 which showed no obvious site in her GI tract worrisome for potential GI blood loss.   PHYSICAL EXAM:  Blood pressure (!) 140/74, pulse 70, temperature (!) 97.5 F (36.4 C), temperature source Oral, resp. rate 14, height 5' 4 (1.626 m), weight 191 lb 8 oz (86.9 kg), SpO2 97%. Wt Readings from Last 3 Encounters:  01/24/24 191 lb 8 oz (86.9 kg)  12/05/23 192 lb (87.1 kg)  09/11/23 191 lb 14.4 oz (87 kg)   Body mass index is 32.87 kg/m. Performance status (ECOG): 1 - Symptomatic but completely ambulatory Physical Exam Constitutional:      Appearance: Normal appearance. She is not ill-appearing.  HENT:     Mouth/Throat:     Mouth: Mucous membranes are moist.     Pharynx: Oropharynx is clear. No oropharyngeal exudate or posterior oropharyngeal erythema.  Cardiovascular:     Rate and Rhythm: Normal rate and regular rhythm.     Heart sounds: No murmur heard.    No friction rub. No gallop.  Pulmonary:     Effort: Pulmonary effort is normal. No respiratory distress.     Breath sounds: Normal breath sounds. No wheezing, rhonchi or rales.  Abdominal:     General: Bowel sounds are normal. There is no distension.     Palpations: Abdomen is soft. There is no mass.     Tenderness: There is no abdominal tenderness.  Musculoskeletal:        General: No swelling.     Right lower leg: No edema.     Left lower leg: No edema.  Lymphadenopathy:      Cervical: No cervical adenopathy.     Upper Body:     Right upper body: No supraclavicular or axillary adenopathy.     Left upper body: No supraclavicular or axillary adenopathy.     Lower Body: No right inguinal adenopathy. No left inguinal adenopathy.  Skin:    General: Skin is warm.     Coloration: Skin is not jaundiced.     Findings: No lesion or rash.  Neurological:     General: No focal deficit present.     Mental Status: She is alert and oriented to person, place, and time. Mental status is at baseline.  Psychiatric:        Mood and Affect: Mood normal.        Behavior: Behavior normal.        Thought Content: Thought content normal.    LABS:      Latest Ref Rng & Units 01/24/2024    2:25 PM 09/11/2023   10:10 AM 05/14/2023   11:04 AM  CBC  WBC 4.0 - 10.5 K/uL 6.6  8.6  5.5   Hemoglobin 12.0 - 15.0 g/dL 89.5  87.5  88.9   Hematocrit 36.0 - 46.0 % 33.4  39.7  35.1   Platelets 150 - 400 K/uL 359  316  376       Latest Ref Rng & Units 01/24/2024    2:25 PM 03/02/2022   12:00 AM 11/02/2021   11:48 AM  CMP  Glucose 70 - 99 mg/dL 98   885   BUN 8 - 23 mg/dL 22  17     17    Creatinine 0.44 - 1.00 mg/dL 9.11  0.7     9.28   Sodium 135 - 145 mmol/L 139  137     138   Potassium 3.5 - 5.1 mmol/L 4.0  4.4     4.0   Chloride 98 - 111 mmol/L 103  103     102   CO2 22 - 32 mmol/L 24  25     25    Calcium 8.9 - 10.3 mg/dL 9.7  9.7     9.8   Total Protein 6.5 - 8.1 g/dL 7.5     Total Bilirubin 0.0 - 1.2 mg/dL 0.2     Alkaline Phos 38 - 126 U/L 33  34       AST 15 - 41 U/L 19  28       ALT 0 - 44 U/L 11  19          This result is from an external source.    Latest Reference Range & Units 01/24/24 14:25  Iron  28 - 170 ug/dL 25 (L)  UIBC ug/dL 533  TIBC 749 - 549 ug/dL 508 (H)  Saturation Ratios 10.4 - 31.8 % 5 (L)  Ferritin 11 - 307 ng/mL 7 (L)  (L): Data is abnormally low (H): Data is abnormally high  ASSESSMENT & PLAN:  Assessment/Plan:  A 78 y.o. female with iron   deficiency anemia.  Her labs today clearly show a return of her iron  deficiency anemia.  Based upon this, I will arrange for her to receive IV iron  over these next few weeks to replenish her iron  stores and improve her hemoglobin.  I will see her back in 4 months to see how well she responded to her IV iron .  The patient understands all the plans discussed today and is in agreement with them.    Janice Sliwa DELENA Kerns, MD

## 2024-01-24 ENCOUNTER — Inpatient Hospital Stay: Admitting: Oncology

## 2024-01-24 ENCOUNTER — Inpatient Hospital Stay: Attending: Oncology

## 2024-01-24 VITALS — BP 140/74 | HR 70 | Temp 97.5°F | Resp 14 | Ht 64.0 in | Wt 191.5 lb

## 2024-01-24 DIAGNOSIS — D508 Other iron deficiency anemias: Secondary | ICD-10-CM

## 2024-01-24 DIAGNOSIS — D509 Iron deficiency anemia, unspecified: Secondary | ICD-10-CM | POA: Diagnosis not present

## 2024-01-24 LAB — FERRITIN: Ferritin: 7 ng/mL — ABNORMAL LOW (ref 11–307)

## 2024-01-24 LAB — CBC WITH DIFFERENTIAL (CANCER CENTER ONLY)
Abs Immature Granulocytes: 0.03 K/uL (ref 0.00–0.07)
Basophils Absolute: 0 K/uL (ref 0.0–0.1)
Basophils Relative: 1 %
Eosinophils Absolute: 0.3 K/uL (ref 0.0–0.5)
Eosinophils Relative: 4 %
HCT: 33.4 % — ABNORMAL LOW (ref 36.0–46.0)
Hemoglobin: 10.4 g/dL — ABNORMAL LOW (ref 12.0–15.0)
Immature Granulocytes: 1 %
Lymphocytes Relative: 24 %
Lymphs Abs: 1.6 K/uL (ref 0.7–4.0)
MCH: 29.8 pg (ref 26.0–34.0)
MCHC: 31.1 g/dL (ref 30.0–36.0)
MCV: 95.7 fL (ref 80.0–100.0)
Monocytes Absolute: 0.7 K/uL (ref 0.1–1.0)
Monocytes Relative: 11 %
Neutro Abs: 3.9 K/uL (ref 1.7–7.7)
Neutrophils Relative %: 59 %
Platelet Count: 359 K/uL (ref 150–400)
RBC: 3.49 MIL/uL — ABNORMAL LOW (ref 3.87–5.11)
RDW: 13.3 % (ref 11.5–15.5)
WBC Count: 6.6 K/uL (ref 4.0–10.5)
nRBC: 0 % (ref 0.0–0.2)

## 2024-01-24 LAB — IRON AND TIBC
Iron: 25 ug/dL — ABNORMAL LOW (ref 28–170)
Saturation Ratios: 5 % — ABNORMAL LOW (ref 10.4–31.8)
TIBC: 491 ug/dL — ABNORMAL HIGH (ref 250–450)
UIBC: 466 ug/dL

## 2024-01-24 LAB — CMP (CANCER CENTER ONLY)
ALT: 11 U/L (ref 0–44)
AST: 19 U/L (ref 15–41)
Albumin: 4.5 g/dL (ref 3.5–5.0)
Alkaline Phosphatase: 33 U/L — ABNORMAL LOW (ref 38–126)
Anion gap: 13 (ref 5–15)
BUN: 22 mg/dL (ref 8–23)
CO2: 24 mmol/L (ref 22–32)
Calcium: 9.7 mg/dL (ref 8.9–10.3)
Chloride: 103 mmol/L (ref 98–111)
Creatinine: 0.88 mg/dL (ref 0.44–1.00)
GFR, Estimated: >60 mL/min (ref >60)
Glucose, Bld: 98 mg/dL (ref 70–99)
Potassium: 4 mmol/L (ref 3.5–5.1)
Sodium: 139 mmol/L (ref 135–145)
Total Bilirubin: 0.2 mg/dL (ref 0.0–1.2)
Total Protein: 7.5 g/dL (ref 6.5–8.1)

## 2024-01-25 ENCOUNTER — Telehealth: Payer: Self-pay

## 2024-01-25 ENCOUNTER — Encounter: Payer: Self-pay | Admitting: Oncology

## 2024-01-25 NOTE — Telephone Encounter (Addendum)
 Dr Ezzard states pt needs IV iron .   Latest Reference Range & Units 01/24/24 14:25  Iron  28 - 170 ug/dL 25 (L)  UIBC ug/dL 533  TIBC 749 - 549 ug/dL 508 (H)  Saturation Ratios 10.4 - 31.8 % 5 (L)  Ferritin 11 - 307 ng/mL 7 (L)  (L): Data is abnormally low (H): Data is abnormally high

## 2024-01-28 ENCOUNTER — Telehealth: Payer: Self-pay | Admitting: Oncology

## 2024-01-28 NOTE — Telephone Encounter (Signed)
 Pt wishes that we call her back as she is not home to write down her schedule.    Scheduling Message Entered by [place], AMY W on 01/25/2024 at  4:11 PM Priority: High INFUSION 1HR30MIN (90)  Department: [facility]  Provider: Ezzard Valaria LABOR, MD  Appointment Notes:  needs IV iron 

## 2024-01-28 NOTE — Telephone Encounter (Signed)
 Patient has been scheduled. Aware of appt date and time.

## 2024-01-30 ENCOUNTER — Ambulatory Visit

## 2024-01-30 VITALS — BP 140/71 | HR 70 | Temp 97.8°F | Resp 16

## 2024-01-30 DIAGNOSIS — D508 Other iron deficiency anemias: Secondary | ICD-10-CM

## 2024-01-30 DIAGNOSIS — D509 Iron deficiency anemia, unspecified: Secondary | ICD-10-CM | POA: Diagnosis not present

## 2024-01-30 MED ORDER — SODIUM CHLORIDE 0.9% FLUSH
10.0000 mL | Freq: Once | INTRAVENOUS | Status: DC | PRN
Start: 2024-01-30 — End: 2024-01-30

## 2024-01-30 MED ORDER — IRON SUCROSE 20 MG/ML IV SOLN
200.0000 mg | Freq: Once | INTRAVENOUS | Status: AC
  Administered 2024-01-30 (×2): 200 mg via INTRAVENOUS
  Filled 2024-01-30: qty 10

## 2024-01-31 ENCOUNTER — Inpatient Hospital Stay

## 2024-01-31 VITALS — BP 141/92 | HR 70 | Temp 98.1°F | Resp 16

## 2024-01-31 DIAGNOSIS — D509 Iron deficiency anemia, unspecified: Secondary | ICD-10-CM | POA: Diagnosis not present

## 2024-01-31 DIAGNOSIS — D508 Other iron deficiency anemias: Secondary | ICD-10-CM

## 2024-01-31 MED ORDER — SODIUM CHLORIDE 0.9 % IV SOLN: INTRAVENOUS | Status: DC

## 2024-01-31 MED ORDER — IRON SUCROSE 20 MG/ML IV SOLN
200.0000 mg | Freq: Once | INTRAVENOUS | Status: AC
  Administered 2024-01-31: 200 mg via INTRAVENOUS
  Filled 2024-01-31: qty 10

## 2024-01-31 NOTE — Patient Instructions (Signed)

## 2024-02-01 ENCOUNTER — Inpatient Hospital Stay

## 2024-02-01 VITALS — BP 138/84 | HR 77 | Temp 98.4°F | Resp 16

## 2024-02-01 DIAGNOSIS — D508 Other iron deficiency anemias: Secondary | ICD-10-CM

## 2024-02-01 DIAGNOSIS — D509 Iron deficiency anemia, unspecified: Secondary | ICD-10-CM | POA: Diagnosis not present

## 2024-02-01 MED ORDER — IRON SUCROSE 20 MG/ML IV SOLN
200.0000 mg | Freq: Once | INTRAVENOUS | Status: AC
Start: 2024-02-01 — End: 2024-02-01
  Administered 2024-02-01: 200 mg via INTRAVENOUS
  Filled 2024-02-01: qty 10

## 2024-02-01 NOTE — Patient Instructions (Signed)

## 2024-02-04 ENCOUNTER — Inpatient Hospital Stay

## 2024-02-04 VITALS — BP 132/74 | HR 71 | Temp 98.4°F | Resp 16

## 2024-02-04 DIAGNOSIS — D509 Iron deficiency anemia, unspecified: Secondary | ICD-10-CM | POA: Diagnosis not present

## 2024-02-04 DIAGNOSIS — D508 Other iron deficiency anemias: Secondary | ICD-10-CM

## 2024-02-04 MED ORDER — IRON SUCROSE 20 MG/ML IV SOLN
200.0000 mg | Freq: Once | INTRAVENOUS | Status: AC
  Administered 2024-02-04: 200 mg via INTRAVENOUS
  Filled 2024-02-04: qty 10

## 2024-02-04 NOTE — Patient Instructions (Signed)

## 2024-02-05 ENCOUNTER — Inpatient Hospital Stay

## 2024-02-05 VITALS — BP 145/85 | HR 74 | Temp 97.5°F | Resp 18

## 2024-02-05 DIAGNOSIS — D509 Iron deficiency anemia, unspecified: Secondary | ICD-10-CM | POA: Diagnosis not present

## 2024-02-05 DIAGNOSIS — D508 Other iron deficiency anemias: Secondary | ICD-10-CM

## 2024-02-05 MED ORDER — IRON SUCROSE 20 MG/ML IV SOLN
200.0000 mg | Freq: Once | INTRAVENOUS | Status: AC
  Administered 2024-02-05: 200 mg via INTRAVENOUS
  Filled 2024-02-05: qty 10

## 2024-02-05 NOTE — Patient Instructions (Signed)

## 2024-02-20 DIAGNOSIS — M533 Sacrococcygeal disorders, not elsewhere classified: Secondary | ICD-10-CM | POA: Diagnosis not present

## 2024-02-20 DIAGNOSIS — W182XXA Fall in (into) shower or empty bathtub, initial encounter: Secondary | ICD-10-CM | POA: Diagnosis not present

## 2024-02-20 DIAGNOSIS — G8911 Acute pain due to trauma: Secondary | ICD-10-CM | POA: Diagnosis not present

## 2024-02-20 DIAGNOSIS — M8588 Other specified disorders of bone density and structure, other site: Secondary | ICD-10-CM | POA: Diagnosis not present

## 2024-02-20 DIAGNOSIS — M8580 Other specified disorders of bone density and structure, unspecified site: Secondary | ICD-10-CM | POA: Diagnosis not present

## 2024-02-20 DIAGNOSIS — M47812 Spondylosis without myelopathy or radiculopathy, cervical region: Secondary | ICD-10-CM | POA: Diagnosis not present

## 2024-02-20 DIAGNOSIS — M4802 Spinal stenosis, cervical region: Secondary | ICD-10-CM | POA: Diagnosis not present

## 2024-02-20 DIAGNOSIS — S0003XA Contusion of scalp, initial encounter: Secondary | ICD-10-CM | POA: Diagnosis not present

## 2024-02-20 DIAGNOSIS — S0990XA Unspecified injury of head, initial encounter: Secondary | ICD-10-CM | POA: Diagnosis not present

## 2024-02-24 ENCOUNTER — Encounter: Payer: Self-pay | Admitting: Oncology

## 2024-02-24 ENCOUNTER — Other Ambulatory Visit: Payer: Self-pay | Admitting: Oncology

## 2024-02-24 DIAGNOSIS — D508 Other iron deficiency anemias: Secondary | ICD-10-CM

## 2024-02-25 ENCOUNTER — Telehealth: Payer: Self-pay | Admitting: Oncology

## 2024-02-25 NOTE — Telephone Encounter (Signed)
 02/25/24 Spoke with daughter(Michelle)and rescheduled appt -Per Dr Ezzard request.

## 2024-03-19 DIAGNOSIS — Z7960 Long term (current) use of unspecified immunomodulators and immunosuppressants: Secondary | ICD-10-CM | POA: Diagnosis not present

## 2024-03-19 DIAGNOSIS — R9389 Abnormal findings on diagnostic imaging of other specified body structures: Secondary | ICD-10-CM | POA: Diagnosis not present

## 2024-03-19 DIAGNOSIS — M810 Age-related osteoporosis without current pathological fracture: Secondary | ICD-10-CM | POA: Diagnosis not present

## 2024-03-19 DIAGNOSIS — M150 Primary generalized (osteo)arthritis: Secondary | ICD-10-CM | POA: Diagnosis not present

## 2024-03-19 DIAGNOSIS — M0579 Rheumatoid arthritis with rheumatoid factor of multiple sites without organ or systems involvement: Secondary | ICD-10-CM | POA: Diagnosis not present

## 2024-03-26 DIAGNOSIS — S92512A Displaced fracture of proximal phalanx of left lesser toe(s), initial encounter for closed fracture: Secondary | ICD-10-CM | POA: Diagnosis not present

## 2024-03-26 DIAGNOSIS — S90422A Blister (nonthermal), left great toe, initial encounter: Secondary | ICD-10-CM | POA: Diagnosis not present

## 2024-03-26 DIAGNOSIS — M25571 Pain in right ankle and joints of right foot: Secondary | ICD-10-CM | POA: Diagnosis not present

## 2024-03-26 DIAGNOSIS — S8261XA Displaced fracture of lateral malleolus of right fibula, initial encounter for closed fracture: Secondary | ICD-10-CM | POA: Diagnosis not present

## 2024-03-26 DIAGNOSIS — M25572 Pain in left ankle and joints of left foot: Secondary | ICD-10-CM | POA: Diagnosis not present

## 2024-03-26 DIAGNOSIS — W108XXA Fall (on) (from) other stairs and steps, initial encounter: Secondary | ICD-10-CM | POA: Diagnosis not present

## 2024-04-02 ENCOUNTER — Telehealth (HOSPITAL_BASED_OUTPATIENT_CLINIC_OR_DEPARTMENT_OTHER): Payer: Self-pay

## 2024-04-02 ENCOUNTER — Telehealth: Payer: Self-pay

## 2024-04-02 DIAGNOSIS — M069 Rheumatoid arthritis, unspecified: Secondary | ICD-10-CM | POA: Diagnosis not present

## 2024-04-02 DIAGNOSIS — S92902D Unspecified fracture of left foot, subsequent encounter for fracture with routine healing: Secondary | ICD-10-CM | POA: Diagnosis not present

## 2024-04-02 DIAGNOSIS — M8580 Other specified disorders of bone density and structure, unspecified site: Secondary | ICD-10-CM | POA: Diagnosis not present

## 2024-04-02 DIAGNOSIS — W108XXA Fall (on) (from) other stairs and steps, initial encounter: Secondary | ICD-10-CM | POA: Diagnosis not present

## 2024-04-02 DIAGNOSIS — S82891D Other fracture of right lower leg, subsequent encounter for closed fracture with routine healing: Secondary | ICD-10-CM | POA: Diagnosis not present

## 2024-04-02 DIAGNOSIS — S90425D Blister (nonthermal), left lesser toe(s), subsequent encounter: Secondary | ICD-10-CM | POA: Diagnosis not present

## 2024-04-02 DIAGNOSIS — S90822A Blister (nonthermal), left foot, initial encounter: Secondary | ICD-10-CM | POA: Diagnosis not present

## 2024-04-02 DIAGNOSIS — S82841A Displaced bimalleolar fracture of right lower leg, initial encounter for closed fracture: Secondary | ICD-10-CM | POA: Diagnosis not present

## 2024-04-02 NOTE — Progress Notes (Addendum)
 Anesthesia Review:  PCP: karen Goins LOV 11/09/23  Cardiology- DR Charae Bruckner LVO 08/13/23- Caitlin Walker,NP  Pulm- Dewald- LOV 12/05/23    PPM/ ICD: Device Orders: Rep Notified:  Chest x-ray : EKG : 08/13/23  CT chest- 11/02/23  Echo : Stress test: Cardiac Cath :   Activity level: can do a flight of stairs without diffiuctly  Sleep Study/ CPAP : none  Fasting Blood Sugar :      / Checks Blood Sugar -- times a day:    Blood Thinner/ Instructions /Last Dose: ASA / Instructions/ Last Dose :    02/20/24- Ed- Fall - hit head in shower  03/27/2024- fell and injured ankle    Completed preop phone call on 04/04/2024 .  Completed med hx and preop instructions.  PT voiced understanding.  Pt instructed at end of phone call to contact Admitting at [phone] .  PT voiced understanding.

## 2024-04-02 NOTE — Telephone Encounter (Signed)
 Preop tele appt now scheduled, med rec and consent done.

## 2024-04-02 NOTE — Patient Instructions (Addendum)
 SURGICAL WAITING ROOM VISITATION  Patients having surgery or a procedure may have no more than 2 support people in the waiting area - these visitors may rotate.    Children under the age of 89 must have an adult with them who is not the patient.  Visitors with respiratory illnesses are discouraged from visiting and should remain at home.  If the patient needs to stay at the hospital during part of their recovery, the visitor guidelines for inpatient rooms apply. Pre-op nurse will coordinate an appropriate time for 1 support person to accompany patient in pre-op.  This support person may not rotate.    Please refer to the [facility] website for the visitor guidelines for Inpatients (after your surgery is over and you are in a regular room).       Your procedure is scheduled on:  04/08/2024    Report to [facility] Main Entrance    Report to admitting at  0845 AM   Call this number if you have problems the morning of surgery [phone]   Do not eat food :After Midnight.   After Midnight you may have the following liquids until _ 0815_____ AM  DAY OF SURGERY  Water Non-Citrus Juices (without pulp, NO RED-Apple, White grape, White cranberry) Black Coffee (NO MILK/CREAM OR CREAMERS, sugar ok)  Clear Tea (NO MILK/CREAM OR CREAMERS, sugar ok) regular and decaf                             Plain Jell-O (NO RED)                                           Fruit ices (not with fruit pulp, NO RED)                                     Popsicles (NO RED)                                                               Sports drinks like Gatorade (NO RED)                            If you have questions, please contact your surgeon's office.     Oral Hygiene is also important to reduce your risk of infection.                                    Remember - BRUSH YOUR TEETH THE MORNING OF SURGERY WITH YOUR REGULAR TOOTHPASTE  DENTURES WILL BE REMOVED PRIOR TO SURGERY PLEASE DO NOT  APPLY Poly grip OR ADHESIVES!!!   Do NOT smoke after Midnight   Stop all vitamins and herbal supplements 7 days before surgery.   Take these medicines the morning of surgery with A SIP OF WATER:   inhalers as usual and bring, Nebulizer if needed, pepcid, flonase , myrbetriq   DO NOT TAKE ANY ORAL DIABETIC MEDICATIONS DAY  OF YOUR SURGERY  Bring CPAP mask and tubing day of surgery.                              You may not have any metal on your body including hair pins, jewelry, and body piercing             Do not wear make-up, lotions, powders, perfumes/cologne, or deodorant  Do not wear nail polish including gel and S&S, artificial/acrylic nails, or any other type of covering on natural nails including finger and toenails. If you have artificial nails, gel coating, etc. that needs to be removed by a nail salon please have this removed prior to surgery or surgery may need to be canceled/ delayed if the surgeon/ anesthesia feels like they are unable to be safely monitored.   Do not shave  48 hours prior to surgery.               Men may shave face and neck.   Do not bring valuables to the hospital. [facility] IS NOT             RESPONSIBLE   FOR VALUABLES.   Contacts, glasses, dentures or bridgework may not be worn into surgery.   Bring small overnight bag day of surgery.   DO NOT BRING YOUR HOME MEDICATIONS TO THE HOSPITAL. PHARMACY WILL DISPENSE MEDICATIONS LISTED ON YOUR MEDICATION LIST TO YOU DURING YOUR ADMISSION IN THE HOSPITAL!    Patients discharged on the day of surgery will not be allowed to drive home.  Someone NEEDS to stay with you for the first 24 hours after anesthesia.   Special Instructions: Bring a copy of your healthcare power of attorney and living will documents the day of surgery if you haven't scanned them before.              Please read over the following fact sheets you were given: IF YOU HAVE QUESTIONS ABOUT YOUR PRE-OP INSTRUCTIONS PLEASE CALL  [phone].   If you received a COVID test during your pre-op visit  it is requested that you wear a mask when out in public, stay away from anyone that may not be feeling well and notify your surgeon if you develop symptoms. If you test positive for Covid or have been in contact with anyone that has tested positive in the last 10 days please notify you surgeon.    [facility] - Preparing for Surgery Before surgery, you can play an important role.  Because skin is not sterile, your skin needs to be as free of germs as possible.  You can reduce the number of germs on your skin by washing with CHG (chlorahexidine gluconate) soap before surgery.  CHG is an antiseptic cleaner which kills germs and bonds with the skin to continue killing germs even after washing. Please DO NOT use if you have an allergy to CHG or antibacterial soaps.  If your skin becomes reddened/irritated stop using the CHG and inform your nurse when you arrive at Short Stay. Do not shave (including legs and underarms) for at least 48 hours prior to the first CHG shower.  You may shave your face/neck.  Please follow these instructions carefully:  1.  Shower with CHG Soap the night before surgery ONLY (DO NOT USE THE SOAP THE MORNING OF SURGERY).  2.  If you choose to wash your hair, wash your hair first as usual with your normal  shampoo.  3.  After you shampoo, rinse your hair and body thoroughly to remove the shampoo.                             4.  Use CHG as you would any other liquid soap.  You can apply chg directly to the skin and wash.  Gently with a scrungie or clean washcloth.  5.  Apply the CHG Soap to your body ONLY FROM THE NECK DOWN.   Do   not use on face/ open                           Wound or open sores. Avoid contact with eyes, ears mouth and   genitals (private parts).                       Wash face,  Genitals (private parts) with your normal soap.             6.  Wash thoroughly, paying special attention to the  area where your    surgery  will be performed.  7.  Thoroughly rinse your body with warm water from the neck down.  8.  DO NOT shower/wash with your normal soap after using and rinsing off the CHG Soap.                9.  Pat yourself dry with a clean towel.            10.  Wear clean pajamas.            11.  Place clean sheets on your bed the night of your first shower and do not  sleep with pets. Day of Surgery : Do not apply any CHG, lotions/deodorants the morning of surgery.  Please wear clean clothes to the hospital/surgery center.  FAILURE TO FOLLOW THESE INSTRUCTIONS MAY RESULT IN THE CANCELLATION OF YOUR SURGERY  PATIENT SIGNATURE_________________________________  NURSE SIGNATURE__________________________________  ________________________________________________________________________

## 2024-04-02 NOTE — Telephone Encounter (Signed)
  Patient Consent for Virtual Visit        [name] has provided verbal consent on 04/02/2024 for a virtual visit (video or telephone).   CONSENT FOR VIRTUAL VISIT FOR:  Janice [name]  By participating in this virtual visit I agree to the following:  I hereby voluntarily request, consent and authorize [facility] and its employed or contracted physicians, physician assistants, nurse practitioners or other licensed health care professionals (the Practitioner), to provide me with telemedicine health care services (the "Services) as deemed necessary by the treating Practitioner. I acknowledge and consent to receive the Services by the Practitioner via telemedicine. I understand that the telemedicine visit will involve communicating with the Practitioner through live audiovisual communication technology and the disclosure of certain medical information by electronic transmission. I acknowledge that I have been given the opportunity to request an in-person assessment or other available alternative prior to the telemedicine visit and am voluntarily participating in the telemedicine visit.  I understand that I have the right to withhold or withdraw my consent to the use of telemedicine in the course of my care at any time, without affecting my right to future care or treatment, and that the Practitioner or I may terminate the telemedicine visit at any time. I understand that I have the right to inspect all information obtained and/or recorded in the course of the telemedicine visit and may receive copies of available information for a reasonable fee.  I understand that some of the potential risks of receiving the Services via telemedicine include:  Delay or interruption in medical evaluation due to technological equipment failure or disruption; Information transmitted may not be sufficient (e.g. poor resolution of images) to allow for appropriate medical decision making by  the Practitioner; and/or  In rare instances, security protocols could fail, causing a breach of personal health information.  Furthermore, I acknowledge that it is my responsibility to provide information about my medical history, conditions and care that is complete and accurate to the best of my ability. I acknowledge that Practitioner's advice, recommendations, and/or decision may be based on factors not within their control, such as incomplete or inaccurate data provided by me or distortions of diagnostic images or specimens that may result from electronic transmissions. I understand that the practice of medicine is not an exact science and that Practitioner makes no warranties or guarantees regarding treatment outcomes. I acknowledge that a copy of this consent can be made available to me via my patient portal [facility] MyChart), or I can request a printed copy by calling the office of [facility].    I understand that my insurance will be billed for this visit.   I have read or had this consent read to me. I understand the contents of this consent, which adequately explains the benefits and risks of the Services being provided via telemedicine.  I have been provided ample opportunity to ask questions regarding this consent and the Services and have had my questions answered to my satisfaction. I give my informed consent for the services to be provided through the use of telemedicine in my medical care

## 2024-04-02 NOTE — Telephone Encounter (Signed)
   Pre-operative Risk Assessment    Patient Name: Janice [name]  DOB: [birth year] MRN: [id number]   Date of last office visit: 08/13/23 with Reche Finder, NP Date of next office visit: N/A  Request for Surgical Clearance    Procedure:  ORIF Right Ankle  Date of Surgery:  Clearance 04/08/24                                Surgeon:  Dr. Velinda Chancy Surgeon's Group or Practice Name:  [facility] Phone number:  [phone] Fax number:  [phone]   Type of Clearance Requested:   - Medical  - Pharmacy:  Hold Aspirin not indicated   Type of Anesthesia:  Choice   Additional requests/questions:    SignedAugustin JONETTA Daring   04/02/2024, 2:51 PM

## 2024-04-02 NOTE — Telephone Encounter (Signed)
   Name: Janice [name]  DOB: 1946-04-29  MRN: [id number]  Primary Cardiologist: Jennipher Bruckner, MD   Preoperative team, please contact this patient and set up a phone call appointment ASAP as procedure is scheduled for 04/08/2024 for further preoperative risk assessment. Please obtain consent and complete medication review. Thank you for your help.  I confirm that guidance regarding antiplatelet and oral anticoagulation therapy has been completed and, if necessary, noted below.  Per office protocol, if patient is without any new symptoms or concerns at the time of their virtual visit, she may hold ASA for 7 days prior to procedure. Please resume ASA as soon as possible postprocedure, at the discretion of the surgeon.    I also confirmed the patient resides in the state of [place] . As per [facility] Medical [employer] telemedicine laws, the patient must reside in the state in which the provider is licensed.   Lamarr Satterfield, NP 04/02/2024, 2:58 PM [facility]

## 2024-04-03 ENCOUNTER — Telehealth: Payer: Self-pay | Admitting: Pulmonary Disease

## 2024-04-03 NOTE — Telephone Encounter (Signed)
 Copy of this encounter was sent to [facility]  I called and left a detailed msg for Rosaline letting her know that this was handled  Nothing further needed

## 2024-04-03 NOTE — Telephone Encounter (Signed)
 ARISCAT Score for Postoperative Pulmonary Complications Predicts risk of pulmonary complications after surgery, including respiratory failure.  Intermediate risk 13.3% risk of in-hospital post-op pulmonary complications (composite including respiratory failure, respiratory infection, pleural effusion, atelectasis, pneumothorax, bronchospasm, aspiration pneumonitis)  Dorn Chill, MD [facility] Office: [phone]   See Amion for personal pager PCCM on call pager [phone] until 7pm. Please call Elink 7p-7a. [phone]

## 2024-04-03 NOTE — Telephone Encounter (Signed)
 Fax received from Dr. Velinda Chancy with [facility] to perform a right ankle surgery under choice anesthesia on patient.  Patient needs surgery clearance. Surgery is scheduled for Tues Apr 08 2024. Patient was seen on 12/05/23. Office protocol is a risk assessment can be sent to surgeon if patient has been seen in 60 days or less.    I called and spoke with her daughter, Rosaline, ok per DPR  She states that the pt feel and broke her ankle last week and needs this surgery asap  It's set for next Tues 10/21  Dr. Kara- daughter reports breathing is stable  She does not want to have to bring her in bc she said [facility] said the surgery was emergent  Are you able to add something to her last note? Thanks!

## 2024-04-04 ENCOUNTER — Other Ambulatory Visit: Payer: Self-pay

## 2024-04-04 ENCOUNTER — Encounter (HOSPITAL_COMMUNITY): Payer: Self-pay

## 2024-04-04 ENCOUNTER — Encounter (HOSPITAL_COMMUNITY)
Admission: RE | Admit: 2024-04-04 | Discharge: 2024-04-04 | Disposition: A | Source: Ambulatory Visit | Attending: Family Medicine | Admitting: Family Medicine

## 2024-04-04 ENCOUNTER — Ambulatory Visit: Attending: Internal Medicine

## 2024-04-04 VITALS — Ht 65.0 in | Wt 194.0 lb

## 2024-04-04 DIAGNOSIS — Z01810 Encounter for preprocedural cardiovascular examination: Secondary | ICD-10-CM | POA: Diagnosis not present

## 2024-04-04 DIAGNOSIS — Z01812 Encounter for preprocedural laboratory examination: Secondary | ICD-10-CM | POA: Insufficient documentation

## 2024-04-04 DIAGNOSIS — Z01818 Encounter for other preprocedural examination: Secondary | ICD-10-CM

## 2024-04-04 HISTORY — DX: Other complications of anesthesia, initial encounter: T88.59XA

## 2024-04-04 HISTORY — DX: Pneumonia, unspecified organism: J18.9

## 2024-04-04 NOTE — Progress Notes (Signed)
 Virtual Visit via Telephone Note   Because of Janice [name] co-morbid illnesses, she is at least at moderate risk for complications without adequate follow up.  This format is felt to be most appropriate for this patient at this time.  Due to technical limitations with video connection (technology), today's appointment will be conducted as an audio only telehealth visit, and Janice [name].   All issues noted in this document were discussed and addressed.  No physical exam could be performed with this format.  Evaluation Performed:  Preoperative cardiovascular risk assessment _____________   Date:  04/04/2024   Patient ID:  Janice [name], Janice [name] Aug 27, 1945, MRN [id number] Patient Location:  Home Provider location:   Office  Primary Care Provider:  [name], FNP Primary Cardiologist:  Janice Bruckner, MD Chief Complaint / Patient Profile  78 y.o. 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 History of Present Illness  Janice [name] is a 78 y.o. female who presents via audio/video conferencing for a telehealth visit today.  Pt was last seen in cardiology clinic on 08/13/2023 by Janice Finder, NP.  At that time Janice [name] was doing well.  The patient is now pending procedure as outlined above. Since her last visit, she has remained stable from a cardiac standpoint. Today denies chest pain, shortness of breath, lower extremity edema, fatigue, palpitations, melena, hematuria, hemoptysis, diaphoresis, weakness, presyncope, syncope, orthopnea, and PND.  Prior to injuring her ankle this month she was regularly working out in her yard, walking, climbing stairs and completing household chores.  She is able to complete  greater than 4 METS of activity. Past Medical History    Past Medical History:  Diagnosis Date   Acute myocardial infarction, unspecified site, episode of care unspecified    Anemia    Arthritis, rheumatoid (HCC)    hx   Back pain, chronic    GERD (gastroesophageal reflux disease)    Pulmonary embolism (HCC)    Scoliosis    Skin cancer    Past Surgical History:  Procedure Laterality Date   bladder tack     BUNIONECTOMY Bilateral    COLONOSCOPY W/ POLYPECTOMY     HERNIA REPAIR     RECTOCELE REPAIR     TONSILLECTOMY     TUBAL LIGATION     Allergies Allergies  Allergen Reactions   Erythromycin Other (See Comments)    Upset stomach    Home Medications    Prior to Admission medications   Medication Sig Start Date End Date Taking? Authorizing Provider  aspirin EC 81 MG tablet Take 81 mg by mouth daily.    [provider]  Biotin 5000 MCG TABS Take 5,000 mcg by mouth daily.    [provider]  budesonide -formoterol  (SYMBICORT ) 160-4.5 MCG/ACT inhaler Inhale 2 puffs into the lungs 2 (two) times daily. INHALE 2 PUFFS INTO THE LUNGS TWICE A DAY 12/05/23   Janice Dorn NOVAK, MD  Calcium Carb-Cholecalciferol 600-5 MG-MCG TABS 1 tablet with food Orally Once a day    [provider]  Cholecalciferol (VITAMIN D3 PO) Take 1 capsule by mouth daily.    [provider]  clotrimazole (MYCELEX) 10 MG troche  06/01/22   [provider]  diclofenac (VOLTAREN) 50 MG EC tablet Take 50 mg by  mouth 2 (two) times daily. 01/23/24   [provider]  doxycycline  (VIBRA -TABS) 100 MG tablet Take 1 tablet (100 mg total) by mouth 2 (two) times daily. 12/05/23   Janice Dorn NOVAK, MD  estradiol (ESTRACE) 0.1 MG/GM vaginal cream SMARTSIG:1 sparingly Vaginal Every Night 03/03/22   [provider]  famotidine (PEPCID) 20 MG tablet Take 20 mg by mouth daily. 01/01/19   [provider]  fluticasone  (FLONASE ) 50 MCG/ACT nasal spray Place 1 spray  into both nostrils daily. 12/05/23   Janice Dorn NOVAK, MD  folic acid  (FOLVITE ) 1 MG tablet Take 1 mg by mouth daily. 02/02/22   [provider]  ipratropium-albuterol  (DUONEB) 0.5-2.5 (3) MG/3ML SOLN NEBULIZE CONTENTS OF 1 VIAL EVERY 6 HOURS AS NEEDED 07/12/22   Janice Dorn NOVAK, MD  Multiple Vitamin (QUINTABS) TABS Take 1 tablet by mouth at bedtime.    [provider]  MYRBETRIQ 50 MG TB24 tablet Take 50 mg by mouth daily.    [provider]  nitroGLYCERIN  (NITROSTAT ) 0.4 MG SL tablet PLACE 1 TABLET (0.4 MG TOTAL) UNDER THE TONGUE EVERY 5 (FIVE) MINUTES AS NEEDED FOR CHEST PAIN. 06/21/20   Janice Slain, MD  pantoprazole  (PROTONIX ) 40 MG tablet Take 1 tablet (40 mg total) by mouth at bedtime. 06/29/23   Janice Groom, MD  predniSONE  (DELTASONE ) 10 MG tablet Take 4 tablets (40 mg total) by mouth daily with breakfast. 12/05/23   Janice Dorn NOVAK, MD  Janice [name] 15 MG TB24 Take by mouth. 10/13/22   [provider]  traMADol (ULTRAM) 50 MG tablet Take 50 mg by mouth 2 (two) times daily as needed. 03/07/22   [provider]   Physical Exam  Vital Signs:  Janice [name] does not have vital signs available for review today. Given telephonic nature of communication, physical exam is limited. AAOx3. NAD. Normal affect.  Speech and respirations are unlabored. Accessory Clinical Findings   None Assessment & Plan  1.  Preoperative Cardiovascular Risk Assessment: Janice [name] perioperative risk of a major cardiac event is 0.4% according to the Revised Cardiac Risk Index (RCRI).  Therefore, she is at low risk for perioperative complications.   Her functional capacity is good at 6.61 METs according to the [facility] Activity Status Index (DASI). Recommendations: According to ACC/AHA guidelines, no further cardiovascular testing needed.  The patient may proceed to surgery at acceptable risk.   Antiplatelet and/or Anticoagulation Recommendations: Aspirin can  be held for 7 days prior to her surgery.  Please resume Aspirin post operatively when it is felt to be safe from a bleeding standpoint.    The patient was advised that if she develops new symptoms prior to surgery to contact our office to arrange for a follow-up visit, and she verbalized understanding.  A copy of this note will be routed to requesting surgeon.  Time:   Today, I have spent 10 minutes with the patient with telehealth technology discussing medical history, symptoms, and management plan.    Trayce Maino D Jovon Winterhalter, NP  04/04/2024, 1:56 PM

## 2024-04-04 NOTE — Progress Notes (Signed)
 Need orders in epic. Surgery on 04/08/24.  Preop done on 04/04/24.

## 2024-04-06 NOTE — H&P (Signed)
 PREOPERATIVE H&P  Chief Complaint: FRACTURE, RIGHT ANKLE  HPI: Janice [name] is a 78 y.o. female who presents with a diagnosis of FRACTURE, RIGHT ANKLE. Symptoms are rated as moderate to severe, and have been worsening.  This is significantly impairing activities of daily living.  She has elected for surgical management.   Past Medical History:  Diagnosis Date   Acute myocardial infarction, unspecified site, episode of care unspecified    Anemia    Arthritis, rheumatoid (HCC)    hx   Back pain, chronic    Complication of anesthesia    after bladder tack surgery per pt was instructed to take some deep breaths due to Fentanyl    GERD (gastroesophageal reflux disease)    Pneumonia    Pulmonary embolism (HCC)    Scoliosis    Skin cancer    Past Surgical History:  Procedure Laterality Date   bladder tack     BUNIONECTOMY Bilateral    COLONOSCOPY W/ POLYPECTOMY     HERNIA REPAIR     RECTOCELE REPAIR     TONSILLECTOMY     TUBAL LIGATION     Social History   Socioeconomic History   Marital status: Married    Spouse name: LARRY   Number of children: 4   Years of education: 12   Highest education level: Not on file  Occupational History   Occupation: RETIRED - [employer]   Occupation: retired  Tobacco Use   Smoking status: Former   Smokeless tobacco: Never   Tobacco comments:    has 7 plus pack-year smoking history, but quit in 1981.   Vaping Use   Vaping status: Never Used  Substance and Sexual Activity   Alcohol use: Not Currently    Comment: rare etoh    Drug use: No   Sexual activity: Not Currently  Other Topics Concern   Not on file  Social History Narrative   Lives in [place] with her daughter. She works full time in a [employer], doing nonexertional work. No herbal medications, regular diet. No regular exercise.    Social Drivers of Corporate investment banker Strain: Not on file  Food Insecurity: Unknown (09/17/2023)   Received from  [facility]   Hunger Vital Sign    Within the past 12 months, you worried that your food would run out before you got money to buy more: Patient declined to answer    Within the past 12 months, the food you bought just didn't last and you didn't have money to get more. : Patient declined to answer  Transportation Needs: Not on file (09/17/2023)  Physical Activity: Not on file  Stress: Not on file  Social Connections: Not on file   Family History  Problem Relation Age of Onset   Lung cancer Mother    Esophageal cancer Mother    Colon cancer Maternal Grandmother    Allergies  Allergen Reactions   Erythromycin Other (See Comments)    Upset stomach    Prior to Admission medications   Medication Sig Start Date End Date Taking? Authorizing Provider  aspirin EC 81 MG tablet Take 81 mg by mouth daily.    [provider]  Biotin 5000 MCG TABS Take 5,000 mcg by mouth daily.    [provider]  budesonide -formoterol  (SYMBICORT ) 160-4.5 MCG/ACT inhaler Inhale 2 puffs into the lungs 2 (two) times daily. INHALE 2 PUFFS INTO THE LUNGS TWICE A DAY 12/05/23   Kara Dorn NOVAK, MD  Calcium Carb-Cholecalciferol  600-5 MG-MCG TABS 1 tablet with food Orally Once a day    [provider]  Cholecalciferol (VITAMIN D3 PO) Take 1 capsule by mouth daily.    [provider]  clotrimazole (MYCELEX) 10 MG troche  06/01/22   [provider]  diclofenac (VOLTAREN) 50 MG EC tablet Take 50 mg by mouth 2 (two) times daily. 01/23/24   [provider]  doxycycline  (VIBRA -TABS) 100 MG tablet Take 1 tablet (100 mg total) by mouth 2 (two) times daily. 12/05/23   Kara Dorn NOVAK, MD  estradiol (ESTRACE) 0.1 MG/GM vaginal cream SMARTSIG:1 sparingly Vaginal Every Night 03/03/22   [provider]  famotidine (PEPCID) 20 MG tablet Take 20 mg by mouth daily. 01/01/19   [provider]  fluticasone  (FLONASE ) 50 MCG/ACT nasal spray Place 1 spray into both  nostrils daily. 12/05/23   Kara Dorn NOVAK, MD  folic acid  (FOLVITE ) 1 MG tablet Take 1 mg by mouth daily. 02/02/22   [provider]  ipratropium-albuterol  (DUONEB) 0.5-2.5 (3) MG/3ML SOLN NEBULIZE CONTENTS OF 1 VIAL EVERY 6 HOURS AS NEEDED 07/12/22   Kara Dorn NOVAK, MD  Multiple Vitamin (QUINTABS) TABS Take 1 tablet by mouth at bedtime.    [provider]  MYRBETRIQ 50 MG TB24 tablet Take 50 mg by mouth daily.    [provider]  nitroGLYCERIN  (NITROSTAT ) 0.4 MG SL tablet PLACE 1 TABLET (0.4 MG TOTAL) UNDER THE TONGUE EVERY 5 (FIVE) MINUTES AS NEEDED FOR CHEST PAIN. 06/21/20   Lonni Slain, MD  pantoprazole  (PROTONIX ) 40 MG tablet Take 1 tablet (40 mg total) by mouth at bedtime. 06/29/23   Charlanne Groom, MD  predniSONE  (DELTASONE ) 10 MG tablet Take 4 tablets (40 mg total) by mouth daily with breakfast. 12/05/23   Kara Dorn NOVAK, MD  RINVOQ 15 MG TB24 Take by mouth. 10/13/22   [provider]  traMADol (ULTRAM) 50 MG tablet Take 50 mg by mouth 2 (two) times daily as needed. 03/07/22   [provider]     Positive ROS: All other systems have been reviewed and were otherwise negative with the exception of those mentioned in the HPI and as above.  Physical Exam: General: Alert, no acute distress Cardiovascular: No pedal edema Respiratory: No cyanosis, no use of accessory musculature GI: No organomegaly, abdomen is soft and non-tender Skin: No lesions in the area of chief complaint Neurologic: Sensation intact distally Psychiatric: Patient is competent for consent with normal mood and affect Lymphatic: No axillary or cervical lymphadenopathy  MUSCULOSKELETAL: TTP right ankle, edema and ecchymosis present, limited ROM d/t pain, NVI   Imaging: xrays show a Weber B distal fibula fracture with mild displacement   Assessment: FRACTURE, RIGHT ANKLE  Plan: Plan for Procedure(s): OPEN REDUCTION INTERNAL FIXATION (ORIF) ANKLE  FRACTURE  The risks benefits and alternatives were discussed with the patient including but not limited to the risks of nonoperative treatment, versus surgical intervention including infection, bleeding, nerve injury,  blood clots, cardiopulmonary complications, morbidity, mortality, among others, and they were willing to proceed.   Weightbearing: NWB RLE Orthopedic devices: splint Showering: POD 3 Dressing: reinforce PRN Medicines: ASA, Oxy vs Norco, Tylenol , Mobic, Zofran   Discharge: home Follow up: 04/18/24 at 10am with me    Gerard CHRISTELLA Large, PA-C Office [phone] 04/06/2024 3:06 PM

## 2024-04-07 ENCOUNTER — Encounter (HOSPITAL_COMMUNITY): Payer: Self-pay | Admitting: Orthopedic Surgery

## 2024-04-07 NOTE — Anesthesia Preprocedure Evaluation (Addendum)
 Anesthesia Evaluation  Patient identified by MRN, date of birth, ID band Patient awake    Reviewed: Allergy & Precautions, NPO status , Patient's Chart, lab work & pertinent test results  History of Anesthesia Complications (+) history of anesthetic complications  Airway Mallampati: II  TM Distance: >3 FB     Dental no notable dental hx. 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 on medications + angina with exertion + CAD and + Past MI  Normal cardiovascular exam Rhythm:Regular Rate:Normal  Hx/o MI 06/02/2009  EKG 08/13/23 NSR  Echo 03/05/19 1. The left ventricle has normal systolic function, with an ejection  fraction of 55-60%. The cavity size was normal. Left ventricular diastolic  Doppler parameters are consistent with impaired relaxation.   2. The right ventricle has normal systolic function. The cavity was  normal. There is no increase in right ventricular wall thickness.   3. The mitral valve is grossly normal. There is mild mitral annular  calcification present.   4. The aortic valve is tricuspid. Moderate thickening of the aortic  valve. Moderate calcification of the aortic valve. Aortic valve  regurgitation is trivial by color flow Doppler.   5. The aorta is normal unless otherwise noted.   Stress test 03/05/19  Nuclear stress EF: 47%. visually, the EF appears to be greater than 47% and appears to be normal  There was no ST segment deviation noted during stress.  This is a low risk study. no evidence of ischemia or previous infarction  The study is normal.     Neuro/Psych negative neurological ROS  negative psych ROS   GI/Hepatic Neg liver ROS, hiatal hernia,GERD  Medicated,,  Endo/Other  Obesity  Renal/GU negative Renal ROS  negative  genitourinary   Musculoskeletal  (+) Arthritis , Rheumatoid disorders,  Scoliosis Chronic back pain   Abdominal  (+) + obese  Peds  Hematology  (+) Blood dyscrasia, anemia   Anesthesia Other Findings   Reproductive/Obstetrics                              Anesthesia Physical Anesthesia Plan  ASA: 3  Anesthesia Plan: General   Post-op Pain Management: Minimal or no pain anticipated and Regional block*   Induction: Intravenous  PONV Risk Score and Plan: 3 and Treatment may vary due to age or medical condition and Propofol  infusion  Airway Management Planned: Oral ETT  Additional Equipment: None  Intra-op Plan:   Post-operative Plan: Extubation in OR  Informed Consent: I have reviewed the patients History and Physical, chart, labs and discussed the procedure including the risks, benefits and alternatives for the proposed anesthesia with the patient or authorized representative who has indicated his/her understanding and acceptance.     Dental advisory given  Plan Discussed with: Anesthesiologist and CRNA  Anesthesia Plan Comments: (Patient does not want SAB See PAT note 04/04/2024)         Anesthesia Quick Evaluation

## 2024-04-07 NOTE — Progress Notes (Signed)
 Anesthesia Chart Review   Case: [id number] Date/Time: 04/08/24 0940   Procedure: OPEN REDUCTION INTERNAL FIXATION (ORIF) ANKLE FRACTURE (Right: Ankle)   Anesthesia type: Choice   Pre-op diagnosis: FRACTURE, RIGHT ANKLE   Location: [facility] ROOM 08 / [facility]   Surgeons: [name], MD       DISCUSSION:78 y.o. former smoker with h/o PE, nonobstructive CAD, right ankle fracture scheduled for above procedure 04/08/24 with Dr. Evalene Beverley.   Per cardiology preoperative evaluation 04/04/2024, Janice [name]'s perioperative risk of a major cardiac event is 0.4% according to the Revised Cardiac Risk Index (RCRI).  Therefore, she is at low risk for perioperative complications.   Her functional capacity is good at 6.61 METs according to the Duke Activity Status Index (DASI). Recommendations: According to ACC/AHA guidelines, no further cardiovascular testing needed.  The patient may proceed to surgery at acceptable risk.   Antiplatelet and/or Anticoagulation Recommendations: Aspirin can be held for 7 days prior to her surgery.  Please resume Aspirin post operatively when it is felt to be safe from a bleeding standpoint.  Per pulmonologist in regards to preoperative evaluation, Intermediate risk 13.3% risk of in-hospital post-op pulmonary complications (composite including respiratory failure, respiratory infection, pleural effusion, atelectasis, pneumothorax, bronchospasm, aspiration pneumonitis)   VS: Ht 5' 5 (1.651 m)   Wt 88 kg   BMI 32.28 kg/m   PROVIDERS: [name], FNP is PCP   Primary Cardiologist:  [name], MD  LABS: Labs reviewed: Acceptable for surgery. (all labs ordered are listed, but only abnormal results are displayed)  Labs Reviewed - No data to display   IMAGES:   EKG:   CV: Myocardial Perfusion 03/05/2019 Nuclear stress EF: 47%. visually, the EF appears to be greater than 47% and appears to be normal There was no ST segment deviation noted  during stress. This is a low risk study. no evidence of ischemia or previous infarction The study is normal.    Echo 03/05/2019 1. The left ventricle has normal systolic function, with an ejection  fraction of 55-60%. The cavity size was normal. Left ventricular diastolic  Doppler parameters are consistent with impaired relaxation.   2. The right ventricle has normal systolic function. The cavity was  normal. There is no increase in right ventricular wall thickness.   3. The mitral valve is grossly normal. There is mild mitral annular  calcification present.   4. The aortic valve is tricuspid. Moderate thickening of the aortic  valve. Moderate calcification of the aortic valve. Aortic valve  regurgitation is trivial by color flow Doppler.   5. The aorta is normal unless otherwise noted.  Past Medical History:  Diagnosis Date   Acute myocardial infarction, unspecified site, episode of care unspecified    Anemia    Arthritis, rheumatoid (HCC)    hx   Back pain, chronic    Complication of anesthesia    after bladder tack surgery per pt was instructed to take some deep breaths due to Fentanyl    GERD (gastroesophageal reflux disease)    Pneumonia    Pulmonary embolism (HCC)    Scoliosis    Skin cancer     Past Surgical History:  Procedure Laterality Date   bladder tack     BUNIONECTOMY Bilateral    COLONOSCOPY W/ POLYPECTOMY     HERNIA REPAIR     RECTOCELE REPAIR     TONSILLECTOMY     TUBAL LIGATION      MEDICATIONS:  aspirin EC 81 MG tablet  Biotin 5000 MCG TABS   budesonide -formoterol  (SYMBICORT ) 160-4.5 MCG/ACT inhaler   Calcium Carb-Cholecalciferol 600-5 MG-MCG TABS   Cholecalciferol (VITAMIN D3 PO)   clotrimazole (MYCELEX) 10 MG troche   diclofenac (VOLTAREN) 50 MG EC tablet   doxycycline  (VIBRA -TABS) 100 MG tablet   estradiol (ESTRACE) 0.1 MG/GM vaginal cream   famotidine (PEPCID) 20 MG tablet   fluticasone  (FLONASE ) 50 MCG/ACT nasal spray   folic acid   (FOLVITE ) 1 MG tablet   ipratropium-albuterol  (DUONEB) 0.5-2.5 (3) MG/3ML SOLN   Multiple Vitamin (QUINTABS) TABS   MYRBETRIQ 50 MG TB24 tablet   nitroGLYCERIN  (NITROSTAT ) 0.4 MG SL tablet   pantoprazole  (PROTONIX ) 40 MG tablet   predniSONE  (DELTASONE ) 10 MG tablet   RINVOQ 15 MG TB24   traMADol (ULTRAM) 50 MG tablet   No current facility-administered medications for this encounter.   Harlene Hoots Ward, PA-C [facility] [phone]

## 2024-04-08 ENCOUNTER — Encounter (HOSPITAL_COMMUNITY): Admission: RE | Disposition: A | Payer: Self-pay | Source: Home / Self Care | Attending: Orthopedic Surgery

## 2024-04-08 ENCOUNTER — Other Ambulatory Visit: Payer: Self-pay

## 2024-04-08 ENCOUNTER — Ambulatory Visit (HOSPITAL_COMMUNITY)
Admission: RE | Admit: 2024-04-08 | Discharge: 2024-04-08 | Disposition: A | Discharge status: Home / Self Care | Attending: Orthopedic Surgery | Admitting: Orthopedic Surgery

## 2024-04-08 ENCOUNTER — Ambulatory Visit (HOSPITAL_COMMUNITY): Payer: Self-pay | Admitting: Physician Assistant

## 2024-04-08 ENCOUNTER — Encounter (HOSPITAL_COMMUNITY): Payer: Self-pay | Admitting: Orthopedic Surgery

## 2024-04-08 ENCOUNTER — Ambulatory Visit (HOSPITAL_COMMUNITY): Payer: Self-pay | Admitting: Anesthesiology

## 2024-04-08 DIAGNOSIS — Z7982 Long term (current) use of aspirin: Secondary | ICD-10-CM | POA: Diagnosis not present

## 2024-04-08 DIAGNOSIS — I252 Old myocardial infarction: Secondary | ICD-10-CM | POA: Insufficient documentation

## 2024-04-08 DIAGNOSIS — Z6832 Body mass index (BMI) 32.0-32.9, adult: Secondary | ICD-10-CM | POA: Diagnosis not present

## 2024-04-08 DIAGNOSIS — Z79899 Other long term (current) drug therapy: Secondary | ICD-10-CM | POA: Insufficient documentation

## 2024-04-08 DIAGNOSIS — I10 Essential (primary) hypertension: Secondary | ICD-10-CM | POA: Diagnosis not present

## 2024-04-08 DIAGNOSIS — Z01818 Encounter for other preprocedural examination: Secondary | ICD-10-CM

## 2024-04-08 DIAGNOSIS — Z86711 Personal history of pulmonary embolism: Secondary | ICD-10-CM | POA: Diagnosis not present

## 2024-04-08 DIAGNOSIS — S82891A Other fracture of right lower leg, initial encounter for closed fracture: Secondary | ICD-10-CM | POA: Diagnosis not present

## 2024-04-08 DIAGNOSIS — Z791 Long term (current) use of non-steroidal anti-inflammatories (NSAID): Secondary | ICD-10-CM | POA: Insufficient documentation

## 2024-04-08 DIAGNOSIS — E669 Obesity, unspecified: Secondary | ICD-10-CM | POA: Diagnosis not present

## 2024-04-08 DIAGNOSIS — Z7952 Long term (current) use of systemic steroids: Secondary | ICD-10-CM | POA: Insufficient documentation

## 2024-04-08 DIAGNOSIS — M549 Dorsalgia, unspecified: Secondary | ICD-10-CM | POA: Diagnosis not present

## 2024-04-08 DIAGNOSIS — G8929 Other chronic pain: Secondary | ICD-10-CM | POA: Insufficient documentation

## 2024-04-08 DIAGNOSIS — Z87891 Personal history of nicotine dependence: Secondary | ICD-10-CM | POA: Insufficient documentation

## 2024-04-08 DIAGNOSIS — M419 Scoliosis, unspecified: Secondary | ICD-10-CM | POA: Diagnosis not present

## 2024-04-08 DIAGNOSIS — S8261XA Displaced fracture of lateral malleolus of right fibula, initial encounter for closed fracture: Secondary | ICD-10-CM | POA: Insufficient documentation

## 2024-04-08 DIAGNOSIS — I2510 Atherosclerotic heart disease of native coronary artery without angina pectoris: Secondary | ICD-10-CM

## 2024-04-08 DIAGNOSIS — M069 Rheumatoid arthritis, unspecified: Secondary | ICD-10-CM | POA: Insufficient documentation

## 2024-04-08 DIAGNOSIS — S82841A Displaced bimalleolar fracture of right lower leg, initial encounter for closed fracture: Secondary | ICD-10-CM | POA: Diagnosis not present

## 2024-04-08 DIAGNOSIS — K219 Gastro-esophageal reflux disease without esophagitis: Secondary | ICD-10-CM | POA: Insufficient documentation

## 2024-04-08 DIAGNOSIS — I25119 Atherosclerotic heart disease of native coronary artery with unspecified angina pectoris: Secondary | ICD-10-CM | POA: Insufficient documentation

## 2024-04-08 DIAGNOSIS — Z7951 Long term (current) use of inhaled steroids: Secondary | ICD-10-CM | POA: Diagnosis not present

## 2024-04-08 DIAGNOSIS — X58XXXA Exposure to other specified factors, initial encounter: Secondary | ICD-10-CM | POA: Insufficient documentation

## 2024-04-08 DIAGNOSIS — D649 Anemia, unspecified: Secondary | ICD-10-CM | POA: Insufficient documentation

## 2024-04-08 DIAGNOSIS — K449 Diaphragmatic hernia without obstruction or gangrene: Secondary | ICD-10-CM | POA: Insufficient documentation

## 2024-04-08 DIAGNOSIS — G8918 Other acute postprocedural pain: Secondary | ICD-10-CM | POA: Diagnosis not present

## 2024-04-08 HISTORY — PX: ORIF ANKLE FRACTURE: SHX5408

## 2024-04-08 LAB — BASIC METABOLIC PANEL WITH GFR
Anion gap: 11 (ref 5–15)
BUN: 24 mg/dL — ABNORMAL HIGH (ref 8–23)
CO2: 25 mmol/L (ref 22–32)
Calcium: 9.6 mg/dL (ref 8.9–10.3)
Chloride: 105 mmol/L (ref 98–111)
Creatinine, Ser: 0.82 mg/dL (ref 0.44–1.00)
GFR, Estimated: >60 mL/min (ref >60)
Glucose, Bld: 107 mg/dL — ABNORMAL HIGH (ref 70–99)
Potassium: 4.2 mmol/L (ref 3.5–5.1)
Sodium: 141 mmol/L (ref 135–145)

## 2024-04-08 LAB — CBC
HCT: 37.3 % (ref 36.0–46.0)
Hemoglobin: 11.4 g/dL — ABNORMAL LOW (ref 12.0–15.0)
MCH: 30.5 pg (ref 26.0–34.0)
MCHC: 30.6 g/dL (ref 30.0–36.0)
MCV: 99.7 fL (ref 80.0–100.0)
Platelets: 364 K/uL (ref 150–400)
RBC: 3.74 MIL/uL — ABNORMAL LOW (ref 3.87–5.11)
RDW: 15.8 % — ABNORMAL HIGH (ref 11.5–15.5)
WBC: 6 K/uL (ref 4.0–10.5)
nRBC: 0 % (ref 0.0–0.2)

## 2024-04-08 SURGERY — OPEN REDUCTION INTERNAL FIXATION (ORIF) ANKLE FRACTURE
Anesthesia: Spinal | Site: Ankle | Laterality: Right

## 2024-04-08 MED ORDER — ONDANSETRON HCL 4 MG/2ML IJ SOLN
INTRAMUSCULAR | Status: DC | PRN
  Administered 2024-04-08: 4 mg via INTRAVENOUS

## 2024-04-08 MED ORDER — OXYCODONE HCL 5 MG PO TABS
5.0000 mg | ORAL_TABLET | Freq: Once | ORAL | Status: AC | PRN
  Administered 2024-04-08: 5 mg via ORAL

## 2024-04-08 MED ORDER — ORAL CARE MOUTH RINSE: 15.0000 mL | Freq: Once | OROMUCOSAL | Status: AC

## 2024-04-08 MED ORDER — ONDANSETRON HCL 4 MG/2ML IJ SOLN
INTRAMUSCULAR | Status: AC
  Filled 2024-04-08: qty 2

## 2024-04-08 MED ORDER — LIDOCAINE HCL (PF) 2 % IJ SOLN
INTRAMUSCULAR | Status: AC
  Filled 2024-04-08: qty 5

## 2024-04-08 MED ORDER — FENTANYL CITRATE (PF) 100 MCG/2ML IJ SOLN
INTRAMUSCULAR | Status: DC | PRN
  Administered 2024-04-08 (×2): 50 ug via INTRAVENOUS

## 2024-04-08 MED ORDER — BUPIVACAINE HCL (PF) 0.5 % IJ SOLN
INTRAMUSCULAR | Status: DC | PRN
  Administered 2024-04-08 (×2): 20 mL via PERINEURAL

## 2024-04-08 MED ORDER — 0.9 % SODIUM CHLORIDE (POUR BTL) OPTIME
TOPICAL | Status: DC | PRN
  Administered 2024-04-08: 1000 mL

## 2024-04-08 MED ORDER — PROPOFOL 10 MG/ML IV BOLUS
INTRAVENOUS | Status: DC | PRN
Start: 2024-04-08 — End: 2024-04-08
  Administered 2024-04-08: 120 mg via INTRAVENOUS

## 2024-04-08 MED ORDER — PHENYLEPHRINE 80 MCG/ML (10ML) SYRINGE FOR IV PUSH (FOR BLOOD PRESSURE SUPPORT)
PREFILLED_SYRINGE | INTRAVENOUS | Status: AC
  Filled 2024-04-08: qty 10

## 2024-04-08 MED ORDER — FENTANYL CITRATE (PF) 50 MCG/ML IJ SOSY
50.0000 ug | PREFILLED_SYRINGE | Freq: Once | INTRAMUSCULAR | Status: AC
Start: 2024-04-08 — End: 2024-04-08

## 2024-04-08 MED ORDER — CEFAZOLIN SODIUM-DEXTROSE 2-4 GM/100ML-% IV SOLN
2.0000 g | INTRAVENOUS | Status: AC
  Administered 2024-04-08: 2 g via INTRAVENOUS
  Filled 2024-04-08: qty 100

## 2024-04-08 MED ORDER — MIDAZOLAM HCL 5 MG/5ML IJ SOLN
INTRAMUSCULAR | Status: DC | PRN
  Administered 2024-04-08: 1 mg via INTRAVENOUS

## 2024-04-08 MED ORDER — AMISULPRIDE (ANTIEMETIC) 5 MG/2ML IV SOLN: 10.0000 mg | Freq: Once | INTRAVENOUS | Status: DC | PRN

## 2024-04-08 MED ORDER — PROPOFOL 1000 MG/100ML IV EMUL
INTRAVENOUS | Status: AC
  Filled 2024-04-08: qty 100

## 2024-04-08 MED ORDER — PROPOFOL 10 MG/ML IV BOLUS
INTRAVENOUS | Status: AC
  Filled 2024-04-08: qty 20

## 2024-04-08 MED ORDER — LIDOCAINE HCL (CARDIAC) PF 100 MG/5ML IV SOSY
PREFILLED_SYRINGE | INTRAVENOUS | Status: DC | PRN
  Administered 2024-04-08: 60 mg via INTRAVENOUS

## 2024-04-08 MED ORDER — HYDROMORPHONE HCL 1 MG/ML IJ SOLN
0.2500 mg | INTRAMUSCULAR | Status: DC | PRN
  Administered 2024-04-08 (×2): 0.5 mg via INTRAVENOUS

## 2024-04-08 MED ORDER — FENTANYL CITRATE (PF) 100 MCG/2ML IJ SOLN
INTRAMUSCULAR | Status: AC
  Filled 2024-04-08: qty 2

## 2024-04-08 MED ORDER — LACTATED RINGERS IV SOLN: INTRAVENOUS | Status: DC

## 2024-04-08 MED ORDER — ACETAMINOPHEN 500 MG PO TABS
1000.0000 mg | ORAL_TABLET | Freq: Once | ORAL | Status: AC
  Administered 2024-04-08: 1000 mg via ORAL
  Filled 2024-04-08: qty 2

## 2024-04-08 MED ORDER — CHLORHEXIDINE GLUCONATE 0.12 % MT SOLN
15.0000 mL | Freq: Once | OROMUCOSAL | Status: AC
  Administered 2024-04-08: 15 mL via OROMUCOSAL

## 2024-04-08 MED ORDER — PHENYLEPHRINE 80 MCG/ML (10ML) SYRINGE FOR IV PUSH (FOR BLOOD PRESSURE SUPPORT)
PREFILLED_SYRINGE | INTRAVENOUS | Status: DC | PRN
  Administered 2024-04-08 (×2): 80 ug via INTRAVENOUS

## 2024-04-08 MED ORDER — DEXAMETHASONE SOD PHOSPHATE PF 10 MG/ML IJ SOLN
INTRAMUSCULAR | Status: DC | PRN
  Administered 2024-04-08: 5 mg via INTRAVENOUS

## 2024-04-08 MED ORDER — HYDROCODONE-ACETAMINOPHEN 10-325 MG PO TABS: 1.0000 | ORAL_TABLET | Freq: Four times a day (QID) | ORAL | 0 refills | Status: AC | PRN

## 2024-04-08 MED ORDER — ONDANSETRON HCL 4 MG/2ML IJ SOLN
4.0000 mg | Freq: Once | INTRAMUSCULAR | Status: AC | PRN
  Administered 2024-04-08: 4 mg via INTRAVENOUS

## 2024-04-08 MED ORDER — EPHEDRINE SULFATE (PRESSORS) 25 MG/5ML IV SOSY
PREFILLED_SYRINGE | INTRAVENOUS | Status: DC | PRN
  Administered 2024-04-08 (×4): 5 mg via INTRAVENOUS

## 2024-04-08 MED ORDER — OXYCODONE HCL 5 MG/5ML PO SOLN: 5.0000 mg | Freq: Once | ORAL | Status: AC | PRN

## 2024-04-08 MED ORDER — BUPIVACAINE HCL (PF) 0.5 % IJ SOLN
INTRAMUSCULAR | Status: AC
  Filled 2024-04-08: qty 30

## 2024-04-08 MED ORDER — FENTANYL CITRATE (PF) 50 MCG/ML IJ SOSY
PREFILLED_SYRINGE | INTRAMUSCULAR | Status: AC
  Administered 2024-04-08: 50 ug via INTRAVENOUS
  Filled 2024-04-08: qty 2

## 2024-04-08 MED ORDER — ONDANSETRON 4 MG PO TBDP: 4.0000 mg | ORAL_TABLET | Freq: Three times a day (TID) | ORAL | 0 refills | Status: AC | PRN

## 2024-04-08 MED ORDER — OXYCODONE HCL 5 MG PO TABS
ORAL_TABLET | ORAL | Status: AC
  Filled 2024-04-08: qty 1

## 2024-04-08 MED ORDER — HYDROMORPHONE HCL 1 MG/ML IJ SOLN
INTRAMUSCULAR | Status: AC
  Filled 2024-04-08: qty 1

## 2024-04-08 MED ORDER — POVIDONE-IODINE 10 % EX SWAB: 2.0000 | Freq: Once | CUTANEOUS | Status: DC

## 2024-04-08 MED ORDER — MIDAZOLAM HCL 2 MG/2ML IJ SOLN
INTRAMUSCULAR | Status: AC
  Filled 2024-04-08: qty 2

## 2024-04-08 MED ORDER — ACETAMINOPHEN 500 MG PO TABS: 500.0000 mg | ORAL_TABLET | Freq: Three times a day (TID) | ORAL | 0 refills | Status: AC | PRN

## 2024-04-08 MED ORDER — MIDAZOLAM HCL (PF) 2 MG/2ML IJ SOLN
1.0000 mg | Freq: Once | INTRAMUSCULAR | Status: DC
Start: 2024-04-08 — End: 2024-04-08

## 2024-04-08 MED ORDER — EPHEDRINE 5 MG/ML INJ
INTRAVENOUS | Status: AC
  Filled 2024-04-08: qty 5

## 2024-04-08 SURGICAL SUPPLY — 59 items
BAG COUNTER SPONGE SURGICOUNT (BAG) ×1 IMPLANT
BAG ZIPLOCK 12X15 (MISCELLANEOUS) ×1 IMPLANT
BANDAGE ESMARK 6X9 LF (GAUZE/BANDAGES/DRESSINGS) ×1 IMPLANT
BIT DRILL 2.6 CANN (BIT) IMPLANT
BLADE SURG 15 STRL LF DISP TIS (BLADE) ×1 IMPLANT
BNDG COHESIVE 6X5 TAN ST LF (GAUZE/BANDAGES/DRESSINGS) ×1 IMPLANT
BNDG ELASTIC 4INX 5YD STR LF (GAUZE/BANDAGES/DRESSINGS) ×1 IMPLANT
BNDG ELASTIC 6INX 5YD STR LF (GAUZE/BANDAGES/DRESSINGS) ×1 IMPLANT
CHLORAPREP W/TINT 26 (MISCELLANEOUS) ×1 IMPLANT
CLSR STERI-STRIP ANTIMIC 1/2X4 (GAUZE/BANDAGES/DRESSINGS) IMPLANT
CORD BIPOLAR FORCEPS 12FT (ELECTRODE) IMPLANT
COVER MAYO STAND STRL (DRAPES) ×1 IMPLANT
COVER SURGICAL LIGHT HANDLE (MISCELLANEOUS) ×1 IMPLANT
CUFF TRNQT CYL 34X4.125X (TOURNIQUET CUFF) ×1 IMPLANT
DRAPE C-ARM 42X120 X-RAY (DRAPES) IMPLANT
DRAPE U-SHAPE 47X51 STRL (DRAPES) ×1 IMPLANT
DRSG EMULSION OIL 3X3 NADH (GAUZE/BANDAGES/DRESSINGS) IMPLANT
ELECT REM PT RETURN 15FT ADLT (MISCELLANEOUS) ×1 IMPLANT
GAUZE PAD ABD 8X10 STRL (GAUZE/BANDAGES/DRESSINGS) ×2 IMPLANT
GAUZE SPONGE 4X4 12PLY STRL (GAUZE/BANDAGES/DRESSINGS) ×1 IMPLANT
GLOVE BIO SURGEON STRL SZ7.5 (GLOVE) ×1 IMPLANT
GLOVE BIOGEL PI IND STRL 7.5 (GLOVE) ×1 IMPLANT
GLOVE BIOGEL PI IND STRL 8 (GLOVE) ×1 IMPLANT
GLOVE SURG SYN 7.0 PF PI (GLOVE) ×1 IMPLANT
GOWN STRL REUS W/ TWL LRG LVL3 (GOWN DISPOSABLE) ×1 IMPLANT
GOWN STRL REUS W/ TWL XL LVL3 (GOWN DISPOSABLE) ×1 IMPLANT
GUIDEWIRE 1.35MM (WIRE) IMPLANT
KIT BASIN OR (CUSTOM PROCEDURE TRAY) ×1 IMPLANT
KIT INST FIBULOCK NL STL DISP (Miscellaneous) IMPLANT
KIT TURNOVER KIT A (KITS) ×1 IMPLANT
MANIFOLD NEPTUNE II (INSTRUMENTS) ×1 IMPLANT
NAIL FIBULA RT 3.0X130 (Miscellaneous) IMPLANT
NDL HYPO 22X1.5 SAFETY MO (MISCELLANEOUS) ×1 IMPLANT
NEEDLE HYPO 22X1.5 SAFETY MO (MISCELLANEOUS) ×1 IMPLANT
NS IRRIG 1000ML POUR BTL (IV SOLUTION) ×1 IMPLANT
PACK ORTHO EXTREMITY (CUSTOM PROCEDURE TRAY) ×1 IMPLANT
PAD CAST 4YDX4 CTTN HI CHSV (CAST SUPPLIES) ×1 IMPLANT
PADDING CAST ABS COTTON 4X4 ST (CAST SUPPLIES) IMPLANT
PADDING CAST COTTON 6X4 STRL (CAST SUPPLIES) ×1 IMPLANT
PADDING CAST SYNTHETIC 6X4 NS (CAST SUPPLIES) IMPLANT
PROTECTOR NERVE ULNAR (MISCELLANEOUS) ×1 IMPLANT
SCREW CAN THREAD 3X18 (Screw) IMPLANT
SCREW CANCELLOUS 3MM 3X14MM (Screw) IMPLANT
SCREW CANN 4.0X30MM LP THD (Screw) IMPLANT
SPIKE FLUID TRANSFER (MISCELLANEOUS) IMPLANT
SPLINT PLASTER CAST XFAST 5X30 (CAST SUPPLIES) ×20 IMPLANT
STRAP ANKLE DISTRACTOR (MISCELLANEOUS) ×1 IMPLANT
SUCTION TUBE FRAZIER 10FR DISP (SUCTIONS) ×1 IMPLANT
SUCTION TUBE FRAZIER 12FR DISP (SUCTIONS) IMPLANT
SUT ETHILON 3 0 PS 1 (SUTURE) IMPLANT
SUT MON AB 2-0 CT1 36 (SUTURE) IMPLANT
SUT MON AB 3-0 SH27 (SUTURE) IMPLANT
SUT VIC AB 0 CT1 36 (SUTURE) ×2 IMPLANT
SUT VIC AB 2-0 SH 27XBRD (SUTURE) ×1 IMPLANT
SYR CONTROL 10ML LL (SYRINGE) ×1 IMPLANT
SYSTEM IMPLANT FIBULOCK STRL (Miscellaneous) ×1 IMPLANT
TOWEL OR 17X26 10 PK STRL BLUE (TOWEL DISPOSABLE) ×1 IMPLANT
TUBING CONNECTING 10 (TUBING) ×1 IMPLANT
UNDERPAD 30X36 HEAVY ABSORB (UNDERPADS AND DIAPERS) ×1 IMPLANT

## 2024-04-08 NOTE — Anesthesia Procedure Notes (Signed)
 Procedure Name: LMA Insertion Date/Time: 04/08/2024 10:23 AM  Performed by: Erick Fitz, CRNAPre-anesthesia Checklist: Patient identified, Emergency Drugs available, Suction available, Patient being monitored and Timeout performed Patient Re-evaluated:Patient Re-evaluated prior to induction Oxygen Delivery Method: Circle system utilized Preoxygenation: Pre-oxygenation with 100% oxygen Induction Type: IV induction Ventilation: Mask ventilation without difficulty LMA: LMA inserted LMA Size: 4.0 Number of attempts: 1 Placement Confirmation: positive ETCO2 and CO2 detector Tube secured with: Tape (secured with pink Hy-tape) Dental Injury: Teeth and Oropharynx as per pre-operative assessment

## 2024-04-08 NOTE — Anesthesia Postprocedure Evaluation (Signed)
 Anesthesia Post Note  Patient: Janice [name]  Procedure(s) Performed: OPEN REDUCTION INTERNAL FIXATION (ORIF) ANKLE FRACTURE (Right: Ankle)     Patient location during evaluation: PACU Anesthesia Type: General Level of consciousness: awake and alert and oriented Pain management: pain level controlled Vital Signs Assessment: post-procedure vital signs reviewed and stable Respiratory status: spontaneous breathing, nonlabored ventilation and respiratory function stable Cardiovascular status: blood pressure returned to baseline and stable Postop Assessment: no apparent nausea or vomiting Anesthetic complications: no   There were no known notable events for this encounter.  Last Vitals:  Vitals:   04/08/24 1230 04/08/24 1240  BP: 127/68   Pulse: 69 65  Resp: 17 16  Temp:    SpO2: 96% 96%    Last Pain:  Vitals:   04/08/24 1240  TempSrc:   PainSc: 5                  Mattison Golay A.

## 2024-04-08 NOTE — Interval H&P Note (Signed)
 History and Physical Interval Note:  04/08/2024 8:53 AM  Janice [name]  has presented today for surgery, with the diagnosis of FRACTURE, RIGHT ANKLE.  The various methods of treatment have been discussed with the patient and family. After consideration of risks, benefits and other options for treatment, the patient has consented to  Procedure(s): OPEN REDUCTION INTERNAL FIXATION (ORIF) ANKLE FRACTURE (Right) as a surgical intervention.  The patient's history has been reviewed, patient examined, no change in status, stable for surgery.  I have reviewed the patient's chart and labs.  Questions were answered to the patient's satisfaction.     [name]

## 2024-04-08 NOTE — Anesthesia Procedure Notes (Signed)
 Anesthesia Regional Block: Popliteal block   Pre-Anesthetic Checklist: , timeout performed,  Correct Patient, Correct Site, Correct Laterality,  Correct Procedure, Correct Position, site marked,  Risks and benefits discussed,  Surgical consent,  Pre-op evaluation,  At surgeon's request and post-op pain management  Laterality: Right  Prep: chloraprep       Needles:  Injection technique: Single-shot  Needle Type: Echogenic Stimulator Needle     Needle Length: 10cm  Needle Gauge: 21   Needle insertion depth: 7 cm   Additional Needles:   Procedures:,,,, ultrasound used (permanent image in chart),,    Narrative:  Start time: 04/08/2024 8:58 AM End time: 04/08/2024 9:02 AM Injection made incrementally with aspirations every 5 mL.  Performed by: Personally  Anesthesiologist: Jerrye Sharper, MD  Additional Notes: Timeout performed. Patient sedated. Relevant anatomy ID'd using US . Incremental 2-5ml injection of LA with frequent aspiration. Patient tolerated procedure well.

## 2024-04-08 NOTE — Discharge Instructions (Addendum)

## 2024-04-08 NOTE — Transfer of Care (Signed)
 Immediate Anesthesia Transfer of Care Note  Patient: Janice [name]  Procedure(s) Performed: OPEN REDUCTION INTERNAL FIXATION (ORIF) ANKLE FRACTURE (Right: Ankle)  Patient Location: PACU  Anesthesia Type:General  Level of Consciousness: awake, alert , oriented, and patient cooperative  Airway & Oxygen Therapy: Patient Spontanous Breathing and Patient connected to face mask oxygen  Post-op Assessment: Report given to RN and Post -op Vital signs reviewed and stable  Post vital signs: Reviewed and stable  Last Vitals:  Vitals Value Taken Time  BP 139/71 04/08/24 11:47  Temp    Pulse 72 04/08/24 11:54  Resp 15 04/08/24 11:54  SpO2 100 % 04/08/24 11:54  Vitals shown include unfiled device data.  Last Pain:  Vitals:   04/08/24 0915  TempSrc:   PainSc: 0-No pain         Complications: There were no known notable events for this encounter.

## 2024-04-08 NOTE — Anesthesia Procedure Notes (Signed)
 Anesthesia Regional Block: Adductor canal block   Pre-Anesthetic Checklist: , timeout performed,  Correct Patient, Correct Site, Correct Laterality,  Correct Procedure, Correct Position, site marked,  Risks and benefits discussed,  Surgical consent,  Pre-op evaluation,  At surgeon's request and post-op pain management  Laterality: Right  Prep: chloraprep       Needles:  Injection technique: Single-shot  Needle Type: Echogenic Stimulator Needle     Needle Length: 10cm  Needle Gauge: 21   Needle insertion depth: 7 cm   Additional Needles:   Procedures:,,,, ultrasound used (permanent image in chart),,    Narrative:  Start time: 04/08/2024 9:02 AM End time: 04/08/2024 9:06 AM Injection made incrementally with aspirations every 5 mL.  Performed by: Personally  Anesthesiologist: Jerrye Sharper, MD  Additional Notes: Timeout performed. Patient sedated. Relevant anatomy ID'd using US . Incremental 2-5ml injection of LA with frequent aspiration. Patient tolerated procedure well.

## 2024-04-08 NOTE — Op Note (Signed)
 04/08/2024  11:37 AM  PATIENT:  Janice [name]    PRE-OPERATIVE DIAGNOSIS:  FRACTURE, RIGHT ANKLE  POST-OPERATIVE DIAGNOSIS:  Same  PROCEDURE:  OPEN REDUCTION INTERNAL FIXATION (ORIF) ANKLE FRACTURE  SURGEON:  [name], MD  ASSISTANT: Gerard Large, PA-C, he was present and scrubbed throughout the case, critical for completion in a timely fashion, and for retraction, instrumentation, and closure.   ANESTHESIA:   gen  PREOPERATIVE INDICATIONS:  [name] is a  78 y.o. female with a diagnosis of FRACTURE, RIGHT ANKLE who failed conservative measures and elected for surgical management.    The risks benefits and alternatives were discussed with the patient preoperatively including but not limited to the risks of infection, bleeding, nerve injury, cardiopulmonary complications, the need for revision surgery, among others, and the patient was willing to proceed.  OPERATIVE IMPLANTS: Arthrex cannulated screw and fibula nail  OPERATIVE FINDINGS: Bimalleolar fractures  BLOOD LOSS: Minimal  COMPLICATIONS: None  TOURNIQUET TIME: None  OPERATIVE PROCEDURE:  Patient was identified in the preoperative holding area and site was marked by me She was transported to the operating theater and placed on the table in supine position taking care to pad all bony prominences. After a preincinduction time out anesthesia was induced. The right lower extremity was prepped and draped in normal sterile fashion and a pre-incision timeout was performed. She received Ancef  for preoperative antibiotics.   Incision was made distal to the medial malleolus as well as a second incision distal lateral malleolus and inserted a guide pin into the fibula under fluoroscopic guidance and also a pin across the medial mall fracture into the tibia I took multiple x-rays and was happy with the alignment of both I then used an entry reamer to create a path into the distal fibula and inserted a fibula  nail.  I deployed the talons next I placed a distal interlock screw and was happy with the alignment and placement on multiple x-rays next I  Next I drilled and placed a partially-threaded cannulated screw across the medial mall fracture with good purchase.  I took multiple x-rays was happy with fracture reduction alignment and hardware placement I thoroughly irrigated closed each incision and placed in a short leg splint  POST OPERATIVE PLAN: Toe-touch weightbearing elevate

## 2024-04-10 ENCOUNTER — Encounter (HOSPITAL_COMMUNITY): Payer: Self-pay | Admitting: Orthopedic Surgery

## 2024-04-18 DIAGNOSIS — S82841D Displaced bimalleolar fracture of right lower leg, subsequent encounter for closed fracture with routine healing: Secondary | ICD-10-CM | POA: Diagnosis not present

## 2024-05-19 DIAGNOSIS — S82841D Displaced bimalleolar fracture of right lower leg, subsequent encounter for closed fracture with routine healing: Secondary | ICD-10-CM | POA: Diagnosis not present

## 2024-05-25 NOTE — Progress Notes (Deleted)
 [facility] at [facility] [address] [place],  [place]  [address] [phone]  Clinic Day:  01/24/2024  Referring physician: [name], FNP   HISTORY OF PRESENT ILLNESS:  The patient is a 78 y.o. female with iron  deficiency anemia.  She comes in today to reassess her iron  and hemoglobin levels after receiving IV iron  in August 2025.   Since her last visit, the patient has been doing fairly well.  She denies having any overt forms of blood loss.  Of note, she had both an EGD and colonoscopy in March 2025 which showed no obvious site in her GI tract worrisome for potential GI blood loss.   PHYSICAL EXAM:  There were no vitals taken for this visit. Wt Readings from Last 3 Encounters:  04/08/24 194 lb (88 kg)  04/04/24 194 lb (88 kg)  01/24/24 191 lb 8 oz (86.9 kg)   There is no height or weight on file to calculate BMI. Performance status (ECOG): 1 - Symptomatic but completely ambulatory Physical Exam Constitutional:      Appearance: Normal appearance. She is not ill-appearing.  HENT:     Mouth/Throat:     Mouth: Mucous membranes are moist.     Pharynx: Oropharynx is clear. No oropharyngeal exudate or posterior oropharyngeal erythema.  Cardiovascular:     Rate and Rhythm: Normal rate and regular rhythm.     Heart sounds: No murmur heard.    No friction rub. No gallop.  Pulmonary:     Effort: Pulmonary effort is normal. No respiratory distress.     Breath sounds: Normal breath sounds. No wheezing, rhonchi or rales.  Abdominal:     General: Bowel sounds are normal. There is no distension.     Palpations: Abdomen is soft. There is no mass.     Tenderness: There is no abdominal tenderness.  Musculoskeletal:        General: No swelling.     Right lower leg: No edema.     Left lower leg: No edema.  Lymphadenopathy:     Cervical: No cervical adenopathy.     Upper Body:     Right upper body: No supraclavicular or axillary adenopathy.     Left upper  body: No supraclavicular or axillary adenopathy.     Lower Body: No right inguinal adenopathy. No left inguinal adenopathy.  Skin:    General: Skin is warm.     Coloration: Skin is not jaundiced.     Findings: No lesion or rash.  Neurological:     General: No focal deficit present.     Mental Status: She is alert and oriented to person, place, and time. Mental status is at baseline.  Psychiatric:        Mood and Affect: Mood normal.        Behavior: Behavior normal.        Thought Content: Thought content normal.    LABS:      Latest Ref Rng & Units 04/08/2024    8:30 AM 01/24/2024    2:25 PM 09/11/2023   10:10 AM  CBC  WBC 4.0 - 10.5 K/uL 6.0  6.6  8.6   Hemoglobin 12.0 - 15.0 g/dL 88.5  89.5  87.5   Hematocrit 36.0 - 46.0 % 37.3  33.4  39.7   Platelets 150 - 400 K/uL 364  359  316       Latest Ref Rng & Units 04/08/2024    8:30 AM 01/24/2024  2:25 PM 03/02/2022   12:00 AM  CMP  Glucose 70 - 99 mg/dL 892  98    BUN 8 - 23 mg/dL 24  22  17       Creatinine 0.44 - 1.00 mg/dL 9.17  9.11  0.7      Sodium 135 - 145 mmol/L 141  139  137      Potassium 3.5 - 5.1 mmol/L 4.2  4.0  4.4      Chloride 98 - 111 mmol/L 105  103  103      CO2 22 - 32 mmol/L 25  24  25       Calcium 8.9 - 10.3 mg/dL 9.6  9.7  9.7      Total Protein 6.5 - 8.1 g/dL  7.5    Total Bilirubin 0.0 - 1.2 mg/dL  0.2    Alkaline Phos 38 - 126 U/L  33  34      AST 15 - 41 U/L  19  28      ALT 0 - 44 U/L  11  19         This result is from an external source.    Latest Reference Range & Units 01/24/24 14:25  Iron  28 - 170 ug/dL 25 (L)  UIBC ug/dL 533  TIBC 749 - 549 ug/dL 508 (H)  Saturation Ratios 10.4 - 31.8 % 5 (L)  Ferritin 11 - 307 ng/mL 7 (L)  (L): Data is abnormally low (H): Data is abnormally high  ASSESSMENT & PLAN:  Assessment/Plan:  A 78 y.o. female with iron  deficiency anemia.  Her labs today clearly show a return of her iron  deficiency anemia.  Based upon this, I will arrange for her to  receive IV iron  over these next few weeks to replenish her iron  stores and improve her hemoglobin.  I will see her back in 4 months to see how well she responded to her IV iron .  The patient understands all the plans discussed today and is in agreement with them.    Batu Cassin DELENA Kerns, MD

## 2024-05-26 ENCOUNTER — Ambulatory Visit: Admitting: Oncology

## 2024-05-26 ENCOUNTER — Other Ambulatory Visit

## 2024-06-04 NOTE — Progress Notes (Signed)
 " [facility] at [facility] [address] [place],  [place]  [address] [phone]  Clinic Day:  06/05/2024  Referring physician: [name], FNP   HISTORY OF PRESENT ILLNESS:  The patient is a 78 y.o. female with iron  deficiency anemia.  She comes in today to reassess her iron  and hemoglobin levels after receiving IV iron  in August 2025.   Since her last visit, the patient has been doing fairly well.  She denies having any overt forms of blood loss.  Of note, she had both an EGD and colonoscopy in March 2025 which showed no obvious site in her GI tract worrisome for potential GI blood loss.   PHYSICAL EXAM:  Blood pressure (!) 146/67, pulse 62, temperature 97.8 F (36.6 C), temperature source Oral, resp. rate 14, height 5' 4 (1.626 m), weight 197 lb 8 oz (89.6 kg), SpO2 97%. Wt Readings from Last 3 Encounters:  06/17/24 189 lb (85.7 kg)  06/05/24 197 lb 8 oz (89.6 kg)  04/08/24 194 lb (88 kg)   Body mass index is 33.9 kg/m. Performance status (ECOG): 1 - Symptomatic but completely ambulatory Physical Exam Constitutional:      Appearance: Normal appearance. She is not ill-appearing.  HENT:     Mouth/Throat:     Mouth: Mucous membranes are moist.     Pharynx: Oropharynx is clear. No oropharyngeal exudate or posterior oropharyngeal erythema.  Cardiovascular:     Rate and Rhythm: Normal rate and regular rhythm.     Heart sounds: No murmur heard.    No friction rub. No gallop.  Pulmonary:     Effort: Pulmonary effort is normal. No respiratory distress.     Breath sounds: Normal breath sounds. No wheezing, rhonchi or rales.  Abdominal:     General: Bowel sounds are normal. There is no distension.     Palpations: Abdomen is soft. There is no mass.     Tenderness: There is no abdominal tenderness.  Musculoskeletal:        General: No swelling.     Right lower leg: No edema.     Left lower leg: No edema.  Lymphadenopathy:     Cervical: No cervical  adenopathy.     Upper Body:     Right upper body: No supraclavicular or axillary adenopathy.     Left upper body: No supraclavicular or axillary adenopathy.     Lower Body: No right inguinal adenopathy. No left inguinal adenopathy.  Skin:    General: Skin is warm.     Coloration: Skin is not jaundiced.     Findings: No lesion or rash.  Neurological:     General: No focal deficit present.     Mental Status: She is alert and oriented to person, place, and time. Mental status is at baseline.  Psychiatric:        Mood and Affect: Mood normal.        Behavior: Behavior normal.        Thought Content: Thought content normal.    LABS:      Latest Ref Rng & Units 06/05/2024   11:14 AM 04/08/2024    8:30 AM 01/24/2024    2:25 PM  CBC  WBC 4.0 - 10.5 K/uL 6.1  6.0  6.6   Hemoglobin 12.0 - 15.0 g/dL 88.2  88.5  89.5   Hematocrit 36.0 - 46.0 % 38.1  37.3  33.4   Platelets 150 - 400 K/uL 343  364  359  Latest Ref Rng & Units 06/05/2024   11:14 AM 04/08/2024    8:30 AM 01/24/2024    2:25 PM  CMP  Glucose 70 - 99 mg/dL 99  892  98   BUN 8 - 23 mg/dL 24  24  22    Creatinine 0.44 - 1.00 mg/dL 9.11  9.17  9.11   Sodium 135 - 145 mmol/L 139  141  139   Potassium 3.5 - 5.1 mmol/L 4.6  4.2  4.0   Chloride 98 - 111 mmol/L 102  105  103   CO2 22 - 32 mmol/L 27  25  24    Calcium 8.9 - 10.3 mg/dL 9.3  9.6  9.7   Total Protein 6.5 - 8.1 g/dL 6.8   7.5   Total Bilirubin 0.0 - 1.2 mg/dL 0.3   0.2   Alkaline Phos 38 - 126 U/L 36   33   AST 15 - 41 U/L 23   19   ALT 0 - 44 U/L 10   11     Latest Reference Range & Units 01/24/24 14:25 06/05/24 11:14  Iron  28 - 170 ug/dL 25 (L) 80  UIBC ug/dL 533 636  TIBC 749 - 549 ug/dL 508 (H) 557  Saturation Ratios 10.4 - 31.8 % 5 (L) 18  Ferritin 11 - 307 ng/mL 7 (L) 33  (L): Data is abnormally low (H): Data is abnormally high (H): Data is abnormally high  ASSESSMENT & PLAN:  Assessment/Plan:  A 78 y.o. female with iron  deficiency anemia.   Although her hemoglobin did not improve very much after her IV iron , her iron  parameters are clearly better.  These levels will continue to be followed over time.  Clinically, she appears to be doing well.  I will see her back in 4 months for repeat clinical assessment.  The patient understands all the plans discussed today and is in agreement with them.    Kae Lauman DELENA Kerns, MD       "

## 2024-06-05 ENCOUNTER — Inpatient Hospital Stay: Attending: Oncology

## 2024-06-05 ENCOUNTER — Other Ambulatory Visit: Payer: Self-pay | Admitting: Oncology

## 2024-06-05 ENCOUNTER — Inpatient Hospital Stay: Admitting: Oncology

## 2024-06-05 VITALS — BP 146/67 | HR 62 | Temp 97.8°F | Resp 14 | Ht 64.0 in | Wt 197.5 lb

## 2024-06-05 DIAGNOSIS — D509 Iron deficiency anemia, unspecified: Secondary | ICD-10-CM | POA: Diagnosis present

## 2024-06-05 DIAGNOSIS — D508 Other iron deficiency anemias: Secondary | ICD-10-CM

## 2024-06-05 LAB — CBC WITH DIFFERENTIAL (CANCER CENTER ONLY)
Abs Immature Granulocytes: 0.01 K/uL (ref 0.00–0.07)
Basophils Absolute: 0 K/uL (ref 0.0–0.1)
Basophils Relative: 1 %
Eosinophils Absolute: 0.3 K/uL (ref 0.0–0.5)
Eosinophils Relative: 5 %
HCT: 38.1 % (ref 36.0–46.0)
Hemoglobin: 11.7 g/dL — ABNORMAL LOW (ref 12.0–15.0)
Immature Granulocytes: 0 %
Lymphocytes Relative: 29 %
Lymphs Abs: 1.8 K/uL (ref 0.7–4.0)
MCH: 30.5 pg (ref 26.0–34.0)
MCHC: 30.7 g/dL (ref 30.0–36.0)
MCV: 99.2 fL (ref 80.0–100.0)
Monocytes Absolute: 0.7 K/uL (ref 0.1–1.0)
Monocytes Relative: 11 %
Neutro Abs: 3.3 K/uL (ref 1.7–7.7)
Neutrophils Relative %: 54 %
Platelet Count: 343 K/uL (ref 150–400)
RBC: 3.84 MIL/uL — ABNORMAL LOW (ref 3.87–5.11)
RDW: 13.8 % (ref 11.5–15.5)
WBC Count: 6.1 K/uL (ref 4.0–10.5)
nRBC: 0 % (ref 0.0–0.2)

## 2024-06-05 LAB — CMP (CANCER CENTER ONLY)
ALT: 10 U/L (ref 0–44)
AST: 23 U/L (ref 15–41)
Albumin: 4.6 g/dL (ref 3.5–5.0)
Alkaline Phosphatase: 36 U/L — ABNORMAL LOW (ref 38–126)
Anion gap: 10 (ref 5–15)
BUN: 24 mg/dL — ABNORMAL HIGH (ref 8–23)
CO2: 27 mmol/L (ref 22–32)
Calcium: 9.3 mg/dL (ref 8.9–10.3)
Chloride: 102 mmol/L (ref 98–111)
Creatinine: 0.88 mg/dL (ref 0.44–1.00)
GFR, Estimated: >60 mL/min (ref >60)
Glucose, Bld: 99 mg/dL (ref 70–99)
Potassium: 4.6 mmol/L (ref 3.5–5.1)
Sodium: 139 mmol/L (ref 135–145)
Total Bilirubin: 0.3 mg/dL (ref 0.0–1.2)
Total Protein: 6.8 g/dL (ref 6.5–8.1)

## 2024-06-05 LAB — FERRITIN: Ferritin: 33 ng/mL (ref 11–307)

## 2024-06-05 LAB — IRON AND TIBC
Iron: 80 ug/dL (ref 28–170)
Saturation Ratios: 18 % (ref 10.4–31.8)
TIBC: 442 ug/dL (ref 250–450)
UIBC: 363 ug/dL

## 2024-06-16 ENCOUNTER — Encounter: Payer: Self-pay | Admitting: Pulmonary Disease

## 2024-06-16 ENCOUNTER — Telehealth: Payer: Self-pay

## 2024-06-16 DIAGNOSIS — J111 Influenza due to unidentified influenza virus with other respiratory manifestations: Secondary | ICD-10-CM

## 2024-06-16 MED ORDER — OSELTAMIVIR PHOSPHATE 75 MG PO CAPS: 75.0000 mg | ORAL_CAPSULE | Freq: Two times a day (BID) | ORAL | 0 refills | Status: AC

## 2024-06-16 NOTE — Telephone Encounter (Signed)
Sent in tamiflu rx

## 2024-06-17 ENCOUNTER — Ambulatory Visit: Payer: Self-pay

## 2024-06-17 ENCOUNTER — Encounter (HOSPITAL_BASED_OUTPATIENT_CLINIC_OR_DEPARTMENT_OTHER): Payer: Self-pay

## 2024-06-17 ENCOUNTER — Other Ambulatory Visit (HOSPITAL_BASED_OUTPATIENT_CLINIC_OR_DEPARTMENT_OTHER): Payer: Self-pay

## 2024-06-17 ENCOUNTER — Encounter: Payer: Self-pay | Admitting: Oncology

## 2024-06-17 ENCOUNTER — Ambulatory Visit (HOSPITAL_BASED_OUTPATIENT_CLINIC_OR_DEPARTMENT_OTHER)

## 2024-06-17 VITALS — Temp 97.8°F | Ht 64.0 in | Wt 189.0 lb

## 2024-06-17 DIAGNOSIS — R079 Chest pain, unspecified: Secondary | ICD-10-CM | POA: Diagnosis not present

## 2024-06-17 DIAGNOSIS — J470 Bronchiectasis with acute lower respiratory infection: Secondary | ICD-10-CM | POA: Diagnosis not present

## 2024-06-17 DIAGNOSIS — J111 Influenza due to unidentified influenza virus with other respiratory manifestations: Secondary | ICD-10-CM

## 2024-06-17 DIAGNOSIS — Z87891 Personal history of nicotine dependence: Secondary | ICD-10-CM | POA: Diagnosis not present

## 2024-06-17 DIAGNOSIS — R911 Solitary pulmonary nodule: Secondary | ICD-10-CM

## 2024-06-17 DIAGNOSIS — I2721 Secondary pulmonary arterial hypertension: Secondary | ICD-10-CM

## 2024-06-17 MED ORDER — PREDNISONE 10 MG PO TABS
ORAL_TABLET | ORAL | 0 refills | Status: AC
  Filled 2024-06-17: qty 20, 8d supply, fill #0

## 2024-06-17 MED ORDER — AMOXICILLIN-POT CLAVULANATE 875-125 MG PO TABS
1.0000 | ORAL_TABLET | Freq: Two times a day (BID) | ORAL | 0 refills | Status: DC
  Filled 2024-06-17: qty 14, 7d supply, fill #0

## 2024-06-17 NOTE — Progress Notes (Signed)
 "  @Patient  ID: Janice [name], female    DOB: [birth year], 78 y.o.   MRN: [id number]  Chief Complaint  Patient presents with   Acute Visit    Referring provider: [name], FNP  HPI: Discussed the use of AI scribe software for clinical note transcription with the patient, who gave verbal consent to proceed.  History of Present Illness  Last OV 12/05/2023: Secily Walthour is a 78 year old woman, former smoker with rheumatoid arthritis on Rinvoq and bronchiectasis who returns to pulmonary clinic for follow up visit.    She experiences sinus congestion and a persistent cough for the past two to three weeks, attributed to post-nasal drip from sinus drainage. These symptoms affect her sleep quality, keeping her awake at night. She denies pain, pressure, wheezing, or shortness of breath.   Reviewed CT chest results from 11/01/23. Bronchiectasis, no findings of UIP. New 4mm nodule in lingula and stable 7mm nodule in left lower lobe.   Despite a large hiatal hernia, she experiences no heartburn or reflux.   She is on Rinvoq for rheumatoid arthritis and has not been using her Symbicort  inhaler or DuoNeb nebulizer treatments recently, although they are available at home.    She maintains a good level of physical activity and does not get easily fatigued during activities such as going to the store.    TEST/EVENTS :  Chest imaging: CT Chest 11/01/23 1. Bilateral lower lobe bronchiectasis and coarsened ground-glass, left greater than right, findings likely to be postinfectious/postinflammatory in etiology. Findings are suggestive of an alternative diagnosis (not UIP) per consensus guidelines: Diagnosis of Idiopathic Pulmonary Fibrosis: An Official ATS/ERS/JRS/ALAT Clinical Practice Guideline. Am JINNY Honey Crit Care Med Vol 198, Iss 5, [phone], Feb 17 2017. 2. 4 mm ill-defined nodule in the lingula, not well seen on the prior exam. If patient is high risk for bronchogenic  carcinoma, a 1 year follow-up CT chest without contrast could be performed. 3. Moderate to large hiatal hernia. 4. Aortic atherosclerosis (ICD10-I70.0). Coronary artery calcification. 5. Enlarged pulmonic trunk, indicative of pulmonary arterial hypertension.   Allergies[1]  Immunization History  Administered Date(s) Administered   DT (Pediatric) 06/21/2009   Pneumococcal Polysaccharide-23 06/21/2009    Past Medical History:  Diagnosis Date   Acute myocardial infarction, unspecified site, episode of care unspecified    Anemia    Arthritis, rheumatoid (HCC)    hx   Back pain, chronic    Complication of anesthesia    after bladder tack surgery per pt was instructed to take some deep breaths due to Fentanyl    GERD (gastroesophageal reflux disease)    Pneumonia    Pulmonary embolism (HCC)    Scoliosis    Skin cancer     Tobacco History: Tobacco Use History[2] Counseling given: Not Answered Tobacco comments: has 7 plus pack-year smoking history, but quit in 1981.    Outpatient Medications Prior to Visit  Medication Sig Dispense Refill   acetaminophen  (TYLENOL ) 500 MG tablet Take 1-2 tablets (500-1,000 mg total) by mouth every 8 (eight) hours as needed for mild pain (pain score 1-3) or moderate pain (pain score 4-6). 60 tablet 0   alendronate (FOSAMAX) 70 MG tablet TAKE 1 TABLET BY MOUTH EVERY 7 DAYS TAKE WITH A FULL GLASS OF WATER. DO NOT LIE DOWN FOR 30 MIN     ALPRAZolam (XANAX) 0.5 MG tablet TAKE 1 TABLET (0.5 MG TOTAL) BY MOUTH 3 (THREE) TIMES A DAY AS NEEDED FOR ANXIETY.     aspirin  EC 81 MG tablet Take 81 mg by mouth daily.     Biotin 5000 MCG TABS Take 5,000 mcg by mouth daily.     budesonide -formoterol  (SYMBICORT ) 160-4.5 MCG/ACT inhaler Inhale 2 puffs into the lungs 2 (two) times daily. INHALE 2 PUFFS INTO THE LUNGS TWICE A DAY 10.2 each 11   Calcium Carb-Cholecalciferol 600-5 MG-MCG TABS 1 tablet with food Orally Once a day     Cholecalciferol (VITAMIN D3 PO) Take 1  capsule by mouth daily.     clotrimazole (MYCELEX) 10 MG troche      diclofenac (VOLTAREN) 50 MG EC tablet Take 50 mg by mouth 2 (two) times daily.     doxycycline  (VIBRA -TABS) 100 MG tablet Take 1 tablet (100 mg total) by mouth 2 (two) times daily. 14 tablet 0   estradiol (ESTRACE) 0.1 MG/GM vaginal cream SMARTSIG:1 sparingly Vaginal Every Night     famotidine (PEPCID) 20 MG tablet Take 20 mg by mouth daily.     fluticasone  (FLONASE ) 50 MCG/ACT nasal spray Place 1 spray into both nostrils daily. 16 g 2   folic acid  (FOLVITE ) 1 MG tablet Take 1 mg by mouth daily.     HYDROcodone -acetaminophen  (NORCO) 10-325 MG tablet Take 1 tablet by mouth every 6 (six) hours as needed for severe pain (pain score 7-10). in ankle after surgery for fracture 28 tablet 0   ipratropium-albuterol  (DUONEB) 0.5-2.5 (3) MG/3ML SOLN NEBULIZE CONTENTS OF 1 VIAL EVERY 6 HOURS AS NEEDED 360 mL 1   Multiple Vitamin (QUINTABS) TABS Take 1 tablet by mouth at bedtime.     MYRBETRIQ 50 MG TB24 tablet Take 50 mg by mouth daily.     nitroGLYCERIN  (NITROSTAT ) 0.4 MG SL tablet PLACE 1 TABLET (0.4 MG TOTAL) UNDER THE TONGUE EVERY 5 (FIVE) MINUTES AS NEEDED FOR CHEST PAIN. 25 tablet 3   ondansetron  (ZOFRAN -ODT) 4 MG disintegrating tablet Take 1 tablet (4 mg total) by mouth every 8 (eight) hours as needed for nausea or vomiting. 15 tablet 0   oseltamivir (TAMIFLU) 75 MG capsule Take 1 capsule (75 mg total) by mouth 2 (two) times daily. 10 capsule 0   pantoprazole  (PROTONIX ) 40 MG tablet Take 1 tablet (40 mg total) by mouth at bedtime. 90 tablet 4   RINVOQ 15 MG TB24 Take by mouth.     No facility-administered medications prior to visit.     Review of Systems:   Constitutional:   No  weight loss, night sweats,  Fevers, chills, fatigue, or  lassitude.  HEENT:   No headaches,  Difficulty swallowing,  Tooth/dental problems, or  Sore throat,                No sneezing, itching, ear ache, nasal congestion, post nasal drip,   CV:  No  chest pain,  Orthopnea, PND, swelling in lower extremities, anasarca, dizziness, palpitations, syncope.   GI  No heartburn, indigestion, abdominal pain, nausea, vomiting, diarrhea, change in bowel habits, loss of appetite, bloody stools.   Resp: No shortness of breath with exertion or at rest.  No excess mucus, no productive cough,  No non-productive cough,  No coughing up of blood.  No change in color of mucus.  No wheezing.  No chest wall deformity  Skin: no rash or lesions.  GU: no dysuria, change in color of urine, no urgency or frequency.  No flank pain, no hematuria   MS:  No joint pain or swelling.  No decreased range of motion.  No back pain.  Physical Exam  There were no vitals taken for this visit.  GEN: A/Ox3; pleasant , NAD, well nourished    HEENT:  [place]/AT,  EACs-clear, TMs-wnl, NOSE-clear, THROAT-clear, no lesions, no postnasal drip or exudate noted.   NECK:  Supple w/ fair ROM; no JVD; normal carotid impulses w/o bruits; no thyromegaly or nodules palpated; no lymphadenopathy.    RESP  Clear  P & A; w/o, wheezes/ rales/ or rhonchi. no accessory muscle use, no dullness to percussion  CARD:  RRR, no m/r/g, no peripheral edema, pulses intact, no cyanosis or clubbing.  GI:   Soft & nt; nml bowel sounds; no organomegaly or masses detected.   Musco: Warm bil, no deformities or joint swelling noted.   Neuro: alert, no focal deficits noted.    Skin: Warm, no lesions or rashes    Lab Results:  CBC    Component Value Date/Time   WBC 6.1 06/05/2024 1114   WBC 6.0 04/08/2024 0830   RBC 3.84 (L) 06/05/2024 1114   HGB 11.7 (L) 06/05/2024 1114   HCT 38.1 06/05/2024 1114   PLT 343 06/05/2024 1114   MCV 99.2 06/05/2024 1114   MCH 30.5 06/05/2024 1114   MCHC 30.7 06/05/2024 1114   RDW 13.8 06/05/2024 1114   LYMPHSABS 1.8 06/05/2024 1114   MONOABS 0.7 06/05/2024 1114   EOSABS 0.3 06/05/2024 1114   BASOSABS 0.0 06/05/2024 1114    BMET    Component Value  Date/Time   NA 139 06/05/2024 1114   NA 137 03/02/2022 0000   K 4.6 06/05/2024 1114   CL 102 06/05/2024 1114   CO2 27 06/05/2024 1114   GLUCOSE 99 06/05/2024 1114   BUN 24 (H) 06/05/2024 1114   BUN 17 03/02/2022 0000   CREATININE 0.88 06/05/2024 1114   CALCIUM 9.3 06/05/2024 1114   GFRNONAA >60 06/05/2024 1114   GFRAA  05/16/2009 1047    >60        The eGFR has been calculated using the MDRD equation. This calculation has not been validated in all clinical situations. eGFR's persistently <60 mL/min signify possible Chronic Kidney Disease.    BNP No results found for: BNP  ProBNP No results found for: PROBNP  Imaging: No results found.  Administration History     None          Latest Ref Rng & Units 08/06/2023    9:56 AM  PFT Results  FVC-Pre L 2.33   FVC-Predicted Pre % 84   FVC-Post L 2.47   FVC-Predicted Post % 88   Pre FEV1/FVC % % 74   Post FEV1/FCV % % 71   FEV1-Pre L 1.73   FEV1-Predicted Pre % 83   FEV1-Post L 1.75   DLCO uncorrected ml/min/mmHg 17.68   DLCO UNC% % 92   DLCO corrected ml/min/mmHg 19.26   DLCO COR %Predicted % 101   DLVA Predicted % 124   TLC L 4.08   TLC % Predicted % 80   RV % Predicted % 79     No results found for: NITRICOXIDE   Assessment & Plan:   Assessment & Plan    No follow-ups on file.  Candis Dandy, PA-C 06/17/2024      [1]  Allergies Allergen Reactions   Erythromycin Other (See Comments)    Upset stomach   [2]  Social History Tobacco Use  Smoking Status Former  Smokeless Tobacco Never  Tobacco Comments   has 7 plus pack-year smoking history, but quit in 1981.    "

## 2024-06-17 NOTE — Progress Notes (Signed)
 "  @Patient  ID: Janice [name], female    DOB: 14-Dec-1945, 78 y.o.   MRN: [id number]  Chief Complaint  Patient presents with   Acute Visit    Referring provider: [name], FNP  HPI: Discussed the use of AI scribe software for clinical note transcription with the patient, who gave verbal consent to proceed.  History of Present Illness  Janice [name] is a 78 year old female with bronchiectasis who presents with concerns of pneumonia. She is accompanied by her daughter, Rosaline.  She reports a persistent cough and chest pain localized to the right side of her chest, particularly when coughing.  She tested positive for the flu after Christmas and was prescribed Tamiflu, but did not pick up the prescription as she was unaware it was called in. Her symptoms have persisted for four to five days, including fever, chills, and significant fatigue, to the point where she has been bedridden and has not been eating. Her daughter had to 'force her to come here.'  She feels she has been less mobile over the last 5 days or so, only getting up to go to the bathroom and then back to bed.  She is experiencing shortness of breath and low energy, stating she 'can't stand up long.' She is coughing up clear sputum and has been having fevers, though she has not measured her temperature at home. She describes having chills and feeling hot and cold.  She is currently using Symbicort , two puffs twice a day, and DuoNebs for her breathing, which she feels have helped 'a little bit.'  She has a history of a broken ankle in October and previous pulmonary embolism years ago, which was discovered after a similar incident involving a broken ankle. She is not currently on blood thinners and has been off them for years. She reports leg pain, particularly in the back of her legs, which she mentioned to her daughter this morning.  She reports right lower rib pain, worse with coughing and deep breathing.     Last OV ([place]) 12/05/2023): Adrien Dietzman is a 78 year old woman, former smoker with rheumatoid arthritis on Rinvoq and bronchiectasis who returns to pulmonary clinic for follow up visit.    She experiences sinus congestion and a persistent cough for the past two to three weeks, attributed to post-nasal drip from sinus drainage. These symptoms affect her sleep quality, keeping her awake at night. She denies pain, pressure, wheezing, or shortness of breath.   Reviewed CT chest results from 11/01/23. Bronchiectasis, no findings of UIP. New 4mm nodule in lingula and stable 7mm nodule in left lower lobe.   Despite a large hiatal hernia, she experiences no heartburn or reflux.   She is on Rinvoq for rheumatoid arthritis and has not been using her Symbicort  inhaler or DuoNeb nebulizer treatments recently, although they are available at home.    She maintains a good level of physical activity and does not get easily fatigued during activities such as going to the store.   TEST/EVENTS :  Chest imaging: CT Chest 11/01/23 1. Bilateral lower lobe bronchiectasis and coarsened ground-glass, left greater than right, findings likely to be postinfectious/postinflammatory in etiology. Findings are suggestive of an alternative diagnosis (not UIP) per consensus guidelines: Diagnosis of Idiopathic Pulmonary Fibrosis: An Official ATS/ERS/JRS/ALAT Clinical Practice Guideline. Am JINNY [employer] Crit Care Med Vol 198, Iss 5, [phone], Feb 17 2017. 2. 4 mm ill-defined nodule in the lingula, not well seen on the prior exam. If  patient is high risk for bronchogenic carcinoma, a 1 year follow-up CT chest without contrast could be performed. 3. Moderate to large hiatal hernia. 4. Aortic atherosclerosis (ICD10-I70.0). Coronary artery calcification. 5. Enlarged pulmonic trunk, indicative of pulmonary arterial hypertension.  Allergies[1]  Immunization History  Administered Date(s) Administered   DT (Pediatric)  06/21/2009   Pneumococcal Polysaccharide-23 06/21/2009    Past Medical History:  Diagnosis Date   Acute myocardial infarction, unspecified site, episode of care unspecified    Anemia    Arthritis, rheumatoid (HCC)    hx   Back pain, chronic    Complication of anesthesia    after bladder tack surgery per pt was instructed to take some deep breaths due to Fentanyl    GERD (gastroesophageal reflux disease)    Pneumonia    Pulmonary embolism (HCC)    Scoliosis    Skin cancer     Tobacco History: Tobacco Use History[2] Counseling given: Not Answered Tobacco comments: has 7 plus pack-year smoking history, but quit in 1981.    Outpatient Medications Prior to Visit  Medication Sig Dispense Refill   acetaminophen  (TYLENOL ) 500 MG tablet Take 1-2 tablets (500-1,000 mg total) by mouth every 8 (eight) hours as needed for mild pain (pain score 1-3) or moderate pain (pain score 4-6). 60 tablet 0   alendronate (FOSAMAX) 70 MG tablet TAKE 1 TABLET BY MOUTH EVERY 7 DAYS TAKE WITH A FULL GLASS OF WATER. DO NOT LIE DOWN FOR 30 MIN     ALPRAZolam (XANAX) 0.5 MG tablet TAKE 1 TABLET (0.5 MG TOTAL) BY MOUTH 3 (THREE) TIMES A DAY AS NEEDED FOR ANXIETY.     aspirin EC 81 MG tablet Take 81 mg by mouth daily.     Biotin 5000 MCG TABS Take 5,000 mcg by mouth daily.     budesonide -formoterol  (SYMBICORT ) 160-4.5 MCG/ACT inhaler Inhale 2 puffs into the lungs 2 (two) times daily. INHALE 2 PUFFS INTO THE LUNGS TWICE A DAY 10.2 each 11   Calcium Carb-Cholecalciferol 600-5 MG-MCG TABS 1 tablet with food Orally Once a day     Cholecalciferol (VITAMIN D3 PO) Take 1 capsule by mouth daily.     clotrimazole (MYCELEX) 10 MG troche      diclofenac (VOLTAREN) 50 MG EC tablet Take 50 mg by mouth 2 (two) times daily.     doxycycline  (VIBRA -TABS) 100 MG tablet Take 1 tablet (100 mg total) by mouth 2 (two) times daily. 14 tablet 0   estradiol (ESTRACE) 0.1 MG/GM vaginal cream SMARTSIG:1 sparingly Vaginal Every Night      famotidine (PEPCID) 20 MG tablet Take 20 mg by mouth daily.     fluticasone  (FLONASE ) 50 MCG/ACT nasal spray Place 1 spray into both nostrils daily. 16 g 2   folic acid  (FOLVITE ) 1 MG tablet Take 1 mg by mouth daily.     HYDROcodone -acetaminophen  (NORCO) 10-325 MG tablet Take 1 tablet by mouth every 6 (six) hours as needed for severe pain (pain score 7-10). in ankle after surgery for fracture 28 tablet 0   ipratropium-albuterol  (DUONEB) 0.5-2.5 (3) MG/3ML SOLN NEBULIZE CONTENTS OF 1 VIAL EVERY 6 HOURS AS NEEDED 360 mL 1   Multiple Vitamin (QUINTABS) TABS Take 1 tablet by mouth at bedtime.     MYRBETRIQ 50 MG TB24 tablet Take 50 mg by mouth daily.     nitroGLYCERIN  (NITROSTAT ) 0.4 MG SL tablet PLACE 1 TABLET (0.4 MG TOTAL) UNDER THE TONGUE EVERY 5 (FIVE) MINUTES AS NEEDED FOR CHEST PAIN. 25 tablet 3  ondansetron  (ZOFRAN -ODT) 4 MG disintegrating tablet Take 1 tablet (4 mg total) by mouth every 8 (eight) hours as needed for nausea or vomiting. 15 tablet 0   oseltamivir (TAMIFLU) 75 MG capsule Take 1 capsule (75 mg total) by mouth 2 (two) times daily. 10 capsule 0   pantoprazole  (PROTONIX ) 40 MG tablet Take 1 tablet (40 mg total) by mouth at bedtime. 90 tablet 4   RINVOQ 15 MG TB24 Take by mouth.     No facility-administered medications prior to visit.     Review of Systems: as per hpi  Constitutional:   No  weight loss, night sweats,  Fevers, chills, fatigue, or  lassitude.  HEENT:   No headaches,  Difficulty swallowing,  Tooth/dental problems, or  Sore throat,                No sneezing, itching, ear ache, nasal congestion, post nasal drip,   CV:  No chest pain,  Orthopnea, PND, swelling in lower extremities, anasarca, dizziness, palpitations, syncope.   GI  No heartburn, indigestion, abdominal pain, nausea, vomiting, diarrhea, change in bowel habits, loss of appetite, bloody stools.   Resp: No shortness of breath with exertion or at rest.  No excess mucus, no productive cough,  No  non-productive cough,  No coughing up of blood.  No change in color of mucus.  No wheezing.  No chest wall deformity  Skin: no rash or lesions.  GU: no dysuria, change in color of urine, no urgency or frequency.  No flank pain, no hematuria   MS:  No joint pain or swelling.  No decreased range of motion.  No back pain.    Physical Exam  Temp 97.8 F (36.6 C) (Oral)   Ht 5' 4 (1.626 m)   Wt 189 lb (85.7 kg)   SpO2 95%   BMI 32.44 kg/m   GEN: A/Ox3; pleasant , NAD, well nourished    HEENT:  [place]/AT,  EACs-clear, TMs-wnl, NOSE-clear, THROAT-clear, no lesions, no postnasal drip or exudate noted.   NECK:  Supple w/ fair ROM; no JVD; normal carotid impulses w/o bruits; no thyromegaly or nodules palpated; no lymphadenopathy.    RESP  Scattered rhonchi throughout with some crackles at the bases.  Does not resolve with cough. no accessory muscle use, no dullness to percussion  CARD:  RRR, no m/r/g, no peripheral edema, pulses intact, no cyanosis or clubbing.  GI:   Soft & nt; nml bowel sounds; no organomegaly or masses detected.   Musco: Warm bil, no deformities or joint swelling noted. Patient reports discomfort of both calves; no visualized swelling or erythema.  Neuro: alert, no focal deficits noted.    Skin: Warm, no lesions or rashes    Lab Results:  CBC    Component Value Date/Time   WBC 6.1 06/05/2024 1114   WBC 6.0 04/08/2024 0830   RBC 3.84 (L) 06/05/2024 1114   HGB 11.7 (L) 06/05/2024 1114   HCT 38.1 06/05/2024 1114   PLT 343 06/05/2024 1114   MCV 99.2 06/05/2024 1114   MCH 30.5 06/05/2024 1114   MCHC 30.7 06/05/2024 1114   RDW 13.8 06/05/2024 1114   LYMPHSABS 1.8 06/05/2024 1114   MONOABS 0.7 06/05/2024 1114   EOSABS 0.3 06/05/2024 1114   BASOSABS 0.0 06/05/2024 1114    BMET    Component Value Date/Time   NA 139 06/05/2024 1114   NA 137 03/02/2022 0000   K 4.6 06/05/2024 1114   CL 102 06/05/2024 1114  CO2 27 06/05/2024 1114   GLUCOSE 99  06/05/2024 1114   BUN 24 (H) 06/05/2024 1114   BUN 17 03/02/2022 0000   CREATININE 0.88 06/05/2024 1114   CALCIUM 9.3 06/05/2024 1114   GFRNONAA >60 06/05/2024 1114   GFRAA  05/16/2009 1047    >60        The eGFR has been calculated using the MDRD equation. This calculation has not been validated in all clinical situations. eGFR's persistently <60 mL/min signify possible Chronic Kidney Disease.    BNP No results found for: BNP  ProBNP No results found for: PROBNP  Imaging: No results found.  Administration History     None          Latest Ref Rng & Units 08/06/2023    9:56 AM  PFT Results  FVC-Pre L 2.33   FVC-Predicted Pre % 84   FVC-Post L 2.47   FVC-Predicted Post % 88   Pre FEV1/FVC % % 74   Post FEV1/FCV % % 71   FEV1-Pre L 1.73   FEV1-Predicted Pre % 83   FEV1-Post L 1.75   DLCO uncorrected ml/min/mmHg 17.68   DLCO UNC% % 92   DLCO corrected ml/min/mmHg 19.26   DLCO COR %Predicted % 101   DLVA Predicted % 124   TLC L 4.08   TLC % Predicted % 80   RV % Predicted % 79     No results found for: NITRICOXIDE   Assessment & Plan:   Assessment & Plan Bronchiectasis with acute lower respiratory infection (HCC)  Influenza  Lung nodule  Pulmonary arterial hypertension (HCC)  Chest pain, unspecified type  Assessment and Plan Assessment & Plan Bronchiectasis with acute lower respiratory infection Acute exacerbation likely secondary to bacterial infection post-influenza. Symptoms and lung sounds indicate exacerbation. CT shows bronchiectasis with scarring. Treat with antibiotics and steroids based on clinical presentation. - Prescribed Augmentin  1 tablet twice daily for 7 days. - Prescribed prednisone  taper for 8 days.  Influenza Recent infection confirmed by positive test. Tamiflu not taken due to delayed initiation and minimal benefit. - Advised against taking Tamiflu due to delayed initiation and minimal benefit.  Evaluation for  pulmonary embolism Concern for pulmonary embolism due to prior history and current chest discomfort in the setting of decreased mobility and ankle surgery in the last 60 days. Symptoms include leg pain, chest discomfort, and decreased mobility.  - Ordered CT angiography of the chest to evaluate for pulmonary embolism.  Chest pain Associated with coughing, possibly related to bronchiectasis exacerbation or pulmonary embolism. No focal lung findings to suggest pneumonia. Evaluated for pulmonary embolism due to history and symptoms. - Ordered CT angiography of the chest to evaluate for pulmonary embolism. - Advised evaluation in the ER to obtain workup and treatment STAT; patient and daughter voiced understanding and ultimately refused ER. - Chest CT ordered STAT; will call with results.  Patient and daughter aware that based on CT findings they may need to go to the ER.    Return in about 5 weeks (around 07/22/2024) for CT results; with Dr. Kara or Charley PA.  Candis Charley, PA-C 06/17/2024      [1]  Allergies Allergen Reactions   Erythromycin Other (See Comments)    Upset stomach   [2]  Social History Tobacco Use  Smoking Status Former  Smokeless Tobacco Never  Tobacco Comments   has 7 plus pack-year smoking history, but quit in 1981.    "

## 2024-06-17 NOTE — Telephone Encounter (Signed)
" °  FYI Only or Action Required?: Action required by provider: request for appointment.  Patient was last seen in primary care on .  Called Nurse Triage reporting Cough.  Symptoms began [name] week ago.  Interventions attempted: Rest, hydration, or home remedies.  Symptoms are: gradually worsening. Pt. Had flu at Christmas. Has continued cough, SOB, wheezing, weakness.  Triage Disposition: See HCP Within 4 Hours (Or PCP Triage)  Patient/caregiver understands and will follow disposition?: Yes     Copied from CRM #[id number]. Topic: Clinical - Red Word Triage >> Jun 17, 2024  8:44 AM Janice [name] wrote: Red Word that prompted transfer to Nurse Triage: pt's daughter, Janice [name], calling in for pt experiencing residual effects from flu, pt states she feels like she has pneumonia again - pt reports SOB, severe cough, wheezing, and weakness - pt's daughter states she would like to get [name] CXR done to rule out pneumonia Reason for Disposition  [1] MILD difficulty breathing (e.g., minimal/no SOB at rest, SOB with walking, pulse < 100) AND [2] still present when not coughing  Answer Assessment - Initial Assessment Questions 1. ONSET: When did the cough begin?      Christmas 2. SEVERITY: How bad is the cough today?      severe 3. SPUTUM: Describe the color of your sputum (e.g., none, dry cough; clear, white, yellow, green)     no 4. HEMOPTYSIS: Are you coughing up any blood? If Yes, ask: How much? (e.g., flecks, streaks, tablespoons, etc.)     no 5. DIFFICULTY BREATHING: Are you having difficulty breathing? If Yes, ask: How bad is it? (e.g., mild, moderate, severe)      mild 6. FEVER: Do you have [name] fever? If Yes, ask: What is your temperature, how was it measured, and when did it start?     no 7. CARDIAC HISTORY: Do you have any history of heart disease? (e.g., heart attack, congestive heart failure)      no 8. LUNG HISTORY: Do you have any history of lung disease?  (e.g.,  pulmonary embolus, asthma, emphysema)     yes 9. PE RISK FACTORS: Do you have [name] history of blood clots? (or: recent major surgery, recent prolonged travel, bedridden)     no 10. OTHER SYMPTOMS: Do you have any other symptoms? (e.g., runny nose, wheezing, chest pain)       Wheezing, weak 11. PREGNANCY: Is there any chance you are pregnant? When was your last menstrual period?       no 12. TRAVEL: Have you traveled out of the country in the last month? (e.g., travel history, exposures)       no  Protocols used: Cough - Acute Non-Productive-[name]-AH  "

## 2024-06-17 NOTE — Patient Instructions (Addendum)
 Complete Chest CT today.  We will call with results.  Start Augmentin  and Prednisone ; finish all medication.  Return to clinic in 4-6 weeks for follow up; sooner if new or worsening symptoms.

## 2024-06-18 ENCOUNTER — Ambulatory Visit (HOSPITAL_COMMUNITY)
Admission: RE | Admit: 2024-06-18 | Discharge: 2024-06-18 | Disposition: A | Discharge status: Home / Self Care | Source: Ambulatory Visit

## 2024-06-18 DIAGNOSIS — R079 Chest pain, unspecified: Secondary | ICD-10-CM | POA: Diagnosis present

## 2024-06-18 MED ORDER — IOHEXOL 350 MG/ML SOLN
75.0000 mL | Freq: Once | INTRAVENOUS | Status: AC | PRN
  Administered 2024-06-18: 75 mL via INTRAVENOUS

## 2024-06-19 ENCOUNTER — Encounter (HOSPITAL_BASED_OUTPATIENT_CLINIC_OR_DEPARTMENT_OTHER): Payer: Self-pay

## 2024-06-21 ENCOUNTER — Encounter: Payer: Self-pay | Admitting: Oncology

## 2024-06-23 ENCOUNTER — Other Ambulatory Visit (HOSPITAL_BASED_OUTPATIENT_CLINIC_OR_DEPARTMENT_OTHER): Payer: Self-pay

## 2024-06-23 MED ORDER — DOXYCYCLINE HYCLATE 100 MG PO TABS: 100.0000 mg | ORAL_TABLET | Freq: Two times a day (BID) | ORAL | 0 refills | Status: DC

## 2024-06-23 MED ORDER — DOXYCYCLINE HYCLATE 100 MG PO TABS: 100.0000 mg | ORAL_TABLET | Freq: Two times a day (BID) | ORAL | 0 refills | Status: AC

## 2024-06-23 NOTE — Telephone Encounter (Signed)
 FYI

## 2024-06-23 NOTE — Telephone Encounter (Signed)
 Please advise

## 2024-07-22 ENCOUNTER — Ambulatory Visit (HOSPITAL_BASED_OUTPATIENT_CLINIC_OR_DEPARTMENT_OTHER)

## 2024-07-25 ENCOUNTER — Other Ambulatory Visit

## 2024-07-25 ENCOUNTER — Ambulatory Visit: Admitting: Oncology

## 2024-08-06 ENCOUNTER — Ambulatory Visit (HOSPITAL_BASED_OUTPATIENT_CLINIC_OR_DEPARTMENT_OTHER)

## 2024-10-03 ENCOUNTER — Inpatient Hospital Stay

## 2024-10-06 ENCOUNTER — Inpatient Hospital Stay: Admitting: Oncology
